# Patient Record
Sex: Male | Born: 1960
Health system: Southern US, Community
[De-identification: ages and names within clinical notes are randomized; demographics above are authoritative.]

## PROBLEM LIST (undated history)

## (undated) DIAGNOSIS — J45909 Unspecified asthma, uncomplicated: Secondary | ICD-10-CM

## (undated) DIAGNOSIS — G312 Degeneration of nervous system due to alcohol: Secondary | ICD-10-CM

## (undated) DIAGNOSIS — R7989 Other specified abnormal findings of blood chemistry: Secondary | ICD-10-CM

## (undated) DIAGNOSIS — F1020 Alcohol dependence, uncomplicated: Secondary | ICD-10-CM

## (undated) DIAGNOSIS — D62 Acute posthemorrhagic anemia: Secondary | ICD-10-CM

## (undated) DIAGNOSIS — R768 Other specified abnormal immunological findings in serum: Secondary | ICD-10-CM

## (undated) DIAGNOSIS — F1010 Alcohol abuse, uncomplicated: Secondary | ICD-10-CM

## (undated) DIAGNOSIS — K2990 Gastroduodenitis, unspecified, without bleeding: Secondary | ICD-10-CM

## (undated) DIAGNOSIS — I10 Essential (primary) hypertension: Secondary | ICD-10-CM

## (undated) DIAGNOSIS — J189 Pneumonia, unspecified organism: Secondary | ICD-10-CM

## (undated) DIAGNOSIS — F1910 Other psychoactive substance abuse, uncomplicated: Secondary | ICD-10-CM

## (undated) DIAGNOSIS — D649 Anemia, unspecified: Secondary | ICD-10-CM

## (undated) DIAGNOSIS — R945 Abnormal results of liver function studies: Secondary | ICD-10-CM

## (undated) DIAGNOSIS — K259 Gastric ulcer, unspecified as acute or chronic, without hemorrhage or perforation: Secondary | ICD-10-CM

## (undated) DIAGNOSIS — R0602 Shortness of breath: Secondary | ICD-10-CM

## (undated) DIAGNOSIS — K264 Chronic or unspecified duodenal ulcer with hemorrhage: Secondary | ICD-10-CM

## (undated) DIAGNOSIS — K625 Hemorrhage of anus and rectum: Secondary | ICD-10-CM

## (undated) HISTORY — PX: SPLENECTOMY: SUR1306

## (undated) HISTORY — PX: TOTAL HIP ARTHROPLASTY: SHX124

## (undated) HISTORY — PX: COLON SURGERY: SHX602

## (undated) HISTORY — PX: BACK SURGERY: SHX140

## (undated) HISTORY — PX: JOINT REPLACEMENT: SHX530

---

## 2008-10-21 DIAGNOSIS — J189 Pneumonia, unspecified organism: Secondary | ICD-10-CM

## 2008-10-21 HISTORY — DX: Pneumonia, unspecified organism: J18.9

## 2012-10-29 ENCOUNTER — Emergency Department (HOSPITAL_COMMUNITY): Payer: Medicare PPO

## 2012-10-29 ENCOUNTER — Inpatient Hospital Stay (HOSPITAL_COMMUNITY)
Admission: EM | Admit: 2012-10-29 | Discharge: 2012-11-04 | DRG: 377 | Disposition: A | Discharge status: Home / Self Care | Payer: Medicare PPO | Attending: Internal Medicine | Admitting: Internal Medicine

## 2012-10-29 ENCOUNTER — Encounter (HOSPITAL_COMMUNITY): Payer: Self-pay | Admitting: *Deleted

## 2012-10-29 DIAGNOSIS — F1910 Other psychoactive substance abuse, uncomplicated: Secondary | ICD-10-CM | POA: Diagnosis present

## 2012-10-29 DIAGNOSIS — R768 Other specified abnormal immunological findings in serum: Secondary | ICD-10-CM

## 2012-10-29 DIAGNOSIS — D72829 Elevated white blood cell count, unspecified: Secondary | ICD-10-CM

## 2012-10-29 DIAGNOSIS — Z87891 Personal history of nicotine dependence: Secondary | ICD-10-CM

## 2012-10-29 DIAGNOSIS — Z9089 Acquired absence of other organs: Secondary | ICD-10-CM

## 2012-10-29 DIAGNOSIS — E876 Hypokalemia: Secondary | ICD-10-CM | POA: Diagnosis present

## 2012-10-29 DIAGNOSIS — F10939 Alcohol use, unspecified with withdrawal, unspecified: Secondary | ICD-10-CM | POA: Diagnosis not present

## 2012-10-29 DIAGNOSIS — F1027 Alcohol dependence with alcohol-induced persisting dementia: Secondary | ICD-10-CM | POA: Diagnosis present

## 2012-10-29 DIAGNOSIS — I959 Hypotension, unspecified: Secondary | ICD-10-CM | POA: Diagnosis not present

## 2012-10-29 DIAGNOSIS — K625 Hemorrhage of anus and rectum: Secondary | ICD-10-CM

## 2012-10-29 DIAGNOSIS — K7010 Alcoholic hepatitis without ascites: Secondary | ICD-10-CM | POA: Diagnosis present

## 2012-10-29 DIAGNOSIS — Z79899 Other long term (current) drug therapy: Secondary | ICD-10-CM

## 2012-10-29 DIAGNOSIS — F1020 Alcohol dependence, uncomplicated: Secondary | ICD-10-CM | POA: Diagnosis present

## 2012-10-29 DIAGNOSIS — K264 Chronic or unspecified duodenal ulcer with hemorrhage: Principal | ICD-10-CM | POA: Diagnosis present

## 2012-10-29 DIAGNOSIS — K259 Gastric ulcer, unspecified as acute or chronic, without hemorrhage or perforation: Secondary | ICD-10-CM | POA: Diagnosis present

## 2012-10-29 DIAGNOSIS — J189 Pneumonia, unspecified organism: Secondary | ICD-10-CM | POA: Diagnosis present

## 2012-10-29 DIAGNOSIS — E46 Unspecified protein-calorie malnutrition: Secondary | ICD-10-CM | POA: Diagnosis present

## 2012-10-29 DIAGNOSIS — E871 Hypo-osmolality and hyponatremia: Secondary | ICD-10-CM | POA: Diagnosis present

## 2012-10-29 DIAGNOSIS — Z9081 Acquired absence of spleen: Secondary | ICD-10-CM

## 2012-10-29 DIAGNOSIS — R000 Tachycardia, unspecified: Secondary | ICD-10-CM

## 2012-10-29 DIAGNOSIS — D649 Anemia, unspecified: Secondary | ICD-10-CM

## 2012-10-29 DIAGNOSIS — Z9660 Presence of unspecified orthopedic joint implant: Secondary | ICD-10-CM

## 2012-10-29 DIAGNOSIS — F1010 Alcohol abuse, uncomplicated: Secondary | ICD-10-CM | POA: Diagnosis present

## 2012-10-29 DIAGNOSIS — D62 Acute posthemorrhagic anemia: Secondary | ICD-10-CM | POA: Diagnosis present

## 2012-10-29 DIAGNOSIS — K449 Diaphragmatic hernia without obstruction or gangrene: Secondary | ICD-10-CM | POA: Diagnosis present

## 2012-10-29 DIAGNOSIS — Z23 Encounter for immunization: Secondary | ICD-10-CM

## 2012-10-29 DIAGNOSIS — K922 Gastrointestinal hemorrhage, unspecified: Secondary | ICD-10-CM | POA: Diagnosis present

## 2012-10-29 DIAGNOSIS — Z720 Tobacco use: Secondary | ICD-10-CM | POA: Diagnosis present

## 2012-10-29 DIAGNOSIS — F10239 Alcohol dependence with withdrawal, unspecified: Secondary | ICD-10-CM

## 2012-10-29 HISTORY — DX: Acute posthemorrhagic anemia: D62

## 2012-10-29 HISTORY — DX: Pneumonia, unspecified organism: J18.9

## 2012-10-29 HISTORY — DX: Unspecified asthma, uncomplicated: J45.909

## 2012-10-29 HISTORY — DX: Anemia, unspecified: D64.9

## 2012-10-29 HISTORY — DX: Hemorrhage of anus and rectum: K62.5

## 2012-10-29 HISTORY — DX: Alcohol abuse, uncomplicated: F10.10

## 2012-10-29 HISTORY — DX: Degeneration of nervous system due to alcohol: G31.2

## 2012-10-29 HISTORY — DX: Essential (primary) hypertension: I10

## 2012-10-29 HISTORY — DX: Other specified abnormal immunological findings in serum: R76.8

## 2012-10-29 HISTORY — DX: Chronic or unspecified duodenal ulcer with hemorrhage: K26.4

## 2012-10-29 HISTORY — DX: Shortness of breath: R06.02

## 2012-10-29 HISTORY — DX: Other psychoactive substance abuse, uncomplicated: F19.10

## 2012-10-29 HISTORY — DX: Alcohol dependence, uncomplicated: F10.20

## 2012-10-29 HISTORY — DX: Gastric ulcer, unspecified as acute or chronic, without hemorrhage or perforation: K25.9

## 2012-10-29 LAB — CBC WITH DIFFERENTIAL/PLATELET
Basophils Absolute: 0.1 10*3/uL (ref 0.0–0.1)
Basophils Relative: 0 % (ref 0–1)
HCT: 21.5 % — ABNORMAL LOW (ref 39.0–52.0)
Hemoglobin: 7.4 g/dL — ABNORMAL LOW (ref 13.0–17.0)
Lymphocytes Relative: 4 % — ABNORMAL LOW (ref 12–46)
MCHC: 34.4 g/dL (ref 30.0–36.0)
Monocytes Absolute: 2.6 10*3/uL — ABNORMAL HIGH (ref 0.1–1.0)
Monocytes Relative: 6 % (ref 3–12)
Neutro Abs: 40.5 10*3/uL — ABNORMAL HIGH (ref 1.7–7.7)
Neutrophils Relative %: 91 % — ABNORMAL HIGH (ref 43–77)
RDW: 14.3 % (ref 11.5–15.5)
Smear Review: INCREASED
WBC: 44.8 10*3/uL — ABNORMAL HIGH (ref 4.0–10.5)

## 2012-10-29 LAB — COMPREHENSIVE METABOLIC PANEL
ALT: 19 U/L (ref 0–53)
AST: 39 U/L — ABNORMAL HIGH (ref 0–37)
Alkaline Phosphatase: 97 U/L (ref 39–117)
Calcium: 8.8 mg/dL (ref 8.4–10.5)
Glucose, Bld: 127 mg/dL — ABNORMAL HIGH (ref 70–99)
Potassium: 3.7 mEq/L (ref 3.5–5.1)
Sodium: 126 mEq/L — ABNORMAL LOW (ref 135–145)
Total Protein: 7.6 g/dL (ref 6.0–8.3)

## 2012-10-29 LAB — ABO/RH: ABO/RH(D): O POS

## 2012-10-29 LAB — APTT: aPTT: 35 seconds (ref 24–37)

## 2012-10-29 LAB — MRSA PCR SCREENING: MRSA by PCR: NEGATIVE

## 2012-10-29 MED ORDER — LEVOFLOXACIN IN D5W 750 MG/150ML IV SOLN
INTRAVENOUS | Status: AC
  Filled 2012-10-29: qty 150

## 2012-10-29 MED ORDER — SODIUM CHLORIDE 0.9 % IV BOLUS (SEPSIS)
500.0000 mL | Freq: Once | INTRAVENOUS | Status: AC
  Administered 2012-10-29: 500 mL via INTRAVENOUS

## 2012-10-29 MED ORDER — SODIUM CHLORIDE 0.9 % IJ SOLN
3.0000 mL | Freq: Two times a day (BID) | INTRAMUSCULAR | Status: DC
  Administered 2012-10-29 – 2012-11-02 (×6): 3 mL via INTRAVENOUS

## 2012-10-29 MED ORDER — THIAMINE HCL 100 MG/ML IJ SOLN
Freq: Once | INTRAVENOUS | Status: AC
  Administered 2012-10-29: 21:00:00 via INTRAVENOUS
  Filled 2012-10-29: qty 1000

## 2012-10-29 MED ORDER — LEVOFLOXACIN IN D5W 750 MG/150ML IV SOLN
750.0000 mg | Freq: Once | INTRAVENOUS | Status: AC
  Administered 2012-10-29: 750 mg via INTRAVENOUS
  Filled 2012-10-29: qty 150

## 2012-10-29 MED ORDER — ONDANSETRON HCL 4 MG PO TABS: 4.0000 mg | ORAL_TABLET | Freq: Four times a day (QID) | ORAL | Status: DC | PRN

## 2012-10-29 MED ORDER — VANCOMYCIN HCL IN DEXTROSE 1-5 GM/200ML-% IV SOLN
1000.0000 mg | INTRAVENOUS | Status: AC
  Administered 2012-10-29: 1000 mg via INTRAVENOUS

## 2012-10-29 MED ORDER — INFLUENZA VIRUS VACC SPLIT PF IM SUSP
0.5000 mL | INTRAMUSCULAR | Status: AC
  Filled 2012-10-29: qty 0.5

## 2012-10-29 MED ORDER — PANTOPRAZOLE SODIUM 40 MG IV SOLR
40.0000 mg | INTRAVENOUS | Status: DC
  Administered 2012-10-29: 40 mg via INTRAVENOUS
  Filled 2012-10-29: qty 40

## 2012-10-29 MED ORDER — VANCOMYCIN HCL IN DEXTROSE 1-5 GM/200ML-% IV SOLN
INTRAVENOUS | Status: AC
  Filled 2012-10-29: qty 200

## 2012-10-29 MED ORDER — ONDANSETRON HCL 4 MG/2ML IJ SOLN: 4.0000 mg | Freq: Four times a day (QID) | INTRAMUSCULAR | Status: DC | PRN

## 2012-10-29 MED ORDER — LEVOFLOXACIN IN D5W 750 MG/150ML IV SOLN
750.0000 mg | INTRAVENOUS | Status: DC
  Administered 2012-10-31 – 2012-11-04 (×5): 750 mg via INTRAVENOUS
  Filled 2012-10-29 (×7): qty 150

## 2012-10-29 MED ORDER — IOHEXOL 300 MG/ML  SOLN
100.0000 mL | Freq: Once | INTRAMUSCULAR | Status: AC | PRN
  Administered 2012-10-29: 100 mL via INTRAVENOUS

## 2012-10-29 MED ORDER — VANCOMYCIN HCL IN DEXTROSE 1-5 GM/200ML-% IV SOLN
1000.0000 mg | Freq: Two times a day (BID) | INTRAVENOUS | Status: DC
  Administered 2012-10-30 – 2012-10-31 (×3): 1000 mg via INTRAVENOUS
  Filled 2012-10-29 (×8): qty 200

## 2012-10-29 MED ORDER — SODIUM CHLORIDE 0.9 % IV SOLN
INTRAVENOUS | Status: DC
  Administered 2012-10-29: 16:00:00 via INTRAVENOUS
  Administered 2012-10-30: 1000 mL via INTRAVENOUS
  Administered 2012-10-30 – 2012-10-31 (×3): via INTRAVENOUS

## 2012-10-29 MED ORDER — SODIUM CHLORIDE 0.9 % IV BOLUS (SEPSIS): 500.0000 mL | Freq: Once | INTRAVENOUS | Status: DC

## 2012-10-29 MED ORDER — LEVOFLOXACIN IN D5W 750 MG/150ML IV SOLN: 750.0000 mg | INTRAVENOUS | Status: DC

## 2012-10-29 NOTE — ED Provider Notes (Signed)
History     CSN: [id number]  Arrival date & time 10/29/12  1029   First MD Initiated Contact with Patient 10/29/12 1217      Chief Complaint  Patient presents with  . Rectal Bleeding     HPI Pt was seen at 1255.   Per pt, c/o gradual onset and persistence of multiple intermittent episodes of rectal bleeding and black stools for the past 1 week. Pt states he had several episodes of NB/NB N/V 2 days ago, since resolved.  Pt also c/o gradual onset and persistence of constant cough for the past 1 week.  States his "ribs hurt from coughing so much."  Denies fevers, no back pain, no CP/palpitations, no diarrhea, no black or blood in emesis, no rectal pain.      Past Medical History  Diagnosis Date  . Rectal bleeding     Past Surgical History  Procedure Date  . Joint replacement   . Total hip arthroplasty   . Back surgery   . Colon surgery       History  Substance Use Topics  . Smoking status: Current Every Day Smoker  . Smokeless tobacco: Not on file  . Alcohol Use: Yes      Review of Systems ROS: Statement: All systems negative except as marked or noted in the HPI; Constitutional: Negative for fever and chills. ; ; Eyes: Negative for eye pain, redness and discharge. ; ; ENMT: Negative for ear pain, hoarseness, nasal congestion, sinus pressure and sore throat. ; ; Cardiovascular: Negative for chest pain, palpitations, diaphoresis, dyspnea and peripheral edema. ; ; Respiratory: +cough. Negative for wheezing and stridor. ; ; Gastrointestinal: +N/V, abd pain, rectal bleeding, black stools. Negative for diarrhea, blood in stool, hematemesis, jaundice. . ; ; Genitourinary: Negative for dysuria, flank pain and hematuria. ; ; Musculoskeletal: Negative for back pain and neck pain. Negative for swelling and trauma.; ; Skin: Negative for pruritus, rash, abrasions, blisters, bruising and skin lesion.; ; Neuro: Negative for headache, lightheadedness and neck stiffness. Negative for weakness,  altered level of consciousness , altered mental status, extremity weakness, paresthesias, involuntary movement, seizure and syncope.       Allergies  Penicillins  Home Medications  No current outpatient prescriptions on file.  BP 129/83  Pulse 130  Temp 98.1 F (36.7 C) (Oral)  Resp 20  Ht 5\' 6"  (1.676 m)  Wt 143 lb (64.864 kg)  BMI 23.08 kg/m2  SpO2 92%  Physical Exam 1300: Physical examination:  Nursing notes reviewed; Vital signs and O2 SAT reviewed;  Constitutional: Well developed, Well nourished, In no acute distress; Head:  Normocephalic, atraumatic; Eyes: EOMI, PERRL, No scleral icterus; ENMT: Mouth and pharynx normal, Mucous membranes dry; Neck: Supple, Full range of motion, No lymphadenopathy; Cardiovascular: Tachycardic rate and rhythm, No gallop; Respiratory: Breath sounds clear & equal bilaterally, No rales, rhonchi, wheezes.  Speaking full sentences with ease, Normal respiratory effort/excursion; Chest: Nontender, Movement normal; Abdomen: Soft, +diffuse tenderness to palp. Nondistended, Normal bowel sounds. Rectal exam performed w/permission of pt and ED RN chaparone present.  Anal tone normal.  Non-tender, soft maroon stool in rectal vault, heme positive.  No fissures, no thrombosed/bleeding external hemorrhoids, no palp masses.;; Extremities: Pulses normal, No tenderness, No edema, No calf edema or asymmetry.; Neuro: AA&Ox3, Major CN grossly intact.  Speech clear. No gross focal motor or sensory deficits in extremities.; Skin: Color pale, Warm, Dry.   ED Course  Procedures   1215:  WBC count significantly elevated.  Will  obtain CT A/P to r/o acute intra-abd process.  CXR without obvious pneumonia.  Hgb 7.4: Type/screen and transfusion orders completed.  Will give IVF bolus for tachycardia until PRBC transfusion ready.  ED RN to start 2nd IV site.  SBP stable, remains afebrile.  Pt again asked by myself and ED RN if he drinks etoh daily; pt continues to deny this saying he  "just drinks a little sometimes."  Question if etoh withdrawal may be contributing pt pt's tachycardia.     1450:  CT A/P without acute abd process, but does have widespread multilobar pneumonia.  Will start IV abx and admit to ICU.  Pt awake/alert, resps without distress, Sats 94-95% R/A.  Remains tachycardic despite IVF boluses.  Will start PRBC transfusion.  Dx and testing d/w pt and family.  Questions answered.  Verb understanding, agreeable to admit.    1535:  T/C to [facility]. Karilyn Cota, case discussed, including:  HPI, pertinent PM/SHx, VS/PE, dx testing, ED course and treatment:  Agreeable to admit, requests he will come to ED for eval.    MDM  MDM Reviewed: nursing note and vitals Interpretation: labs and CT scan Total time providing critical care: 30-74 minutes. This excludes time spent performing separately reportable procedures and services. Consults: admitting MD   CRITICAL CARE Performed by: Laray Anger Total critical care time: 45 Critical care time was exclusive of separately billable procedures and treating other patients. Critical care was necessary to treat or prevent imminent or life-threatening deterioration. Critical care was time spent personally by me on the following activities: development of treatment plan with patient and/or surrogate as well as nursing, discussions with consultants, evaluation of patient's response to treatment, examination of patient, obtaining history from patient or surrogate, ordering and performing treatments and interventions, ordering and review of laboratory studies, ordering and review of radiographic studies, pulse oximetry and re-evaluation of patient's condition.   Results for orders placed during the hospital encounter of 10/29/12  CBC WITH DIFFERENTIAL      Component Value Range   WBC 44.8 (*) 4.0 - 10.5 K/uL   RBC 2.33 (*) 4.22 - 5.81 MIL/uL   Hemoglobin 7.4 (*) 13.0 - 17.0 g/dL   HCT 16.1 (*) 09.6 - 04.5 %   MCV 92.3  78.0  - 100.0 fL   MCH 31.8  26.0 - 34.0 pg   MCHC 34.4  30.0 - 36.0 g/dL   RDW 40.9  81.1 - 91.4 %   Platelets 559 (*) 150 - 400 K/uL   Neutrophils Relative 91 (*) 43 - 77 %   Neutro Abs 40.5 (*) 1.7 - 7.7 K/uL   Lymphocytes Relative 4 (*) 12 - 46 %   Lymphs Abs 1.6  0.7 - 4.0 K/uL   Monocytes Relative 6  3 - 12 %   Monocytes Absolute 2.6 (*) 0.1 - 1.0 K/uL   Eosinophils Relative 0  0 - 5 %   Eosinophils Absolute 0.0  0.0 - 0.7 K/uL   Basophils Relative 0  0 - 1 %   Basophils Absolute 0.1  0.0 - 0.1 K/uL   WBC Morphology INCREASED BANDS (>20% BANDS)     Smear Review PLATELETS APPEAR INCREASED    COMPREHENSIVE METABOLIC PANEL      Component Value Range   Sodium 126 (*) 135 - 145 mEq/L   Potassium 3.7  3.5 - 5.1 mEq/L   Chloride 89 (*) 96 - 112 mEq/L   CO2 30  19 - 32 mEq/L   Glucose,  Bld 127 (*) 70 - 99 mg/dL   BUN 23  6 - 23 mg/dL   Creatinine, Ser 4.09  0.50 - 1.35 mg/dL   Calcium 8.8  8.4 - 81.1 mg/dL   Total Protein 7.6  6.0 - 8.3 g/dL   Albumin 2.4 (*) 3.5 - 5.2 g/dL   AST 39 (*) 0 - 37 U/L   ALT 19  0 - 53 U/L   Alkaline Phosphatase 97  39 - 117 U/L   Total Bilirubin 0.4  0.3 - 1.2 mg/dL   GFR calc non Af Amer >90  >90 mL/min   GFR calc Af Amer >90  >90 mL/min  TYPE AND SCREEN      Component Value Range   ABO/RH(D) O POS     Antibody Screen NEG     Sample Expiration 11/01/2012     Unit Number [id number]     Blood Component Type RBC LR PHER2     Unit division 00     Status of Unit ISSUED     Transfusion Status OK TO TRANSFUSE     Crossmatch Result Compatible     Unit Number [id number]     Blood Component Type RBC LR PHER1     Unit division 00     Status of Unit ALLOCATED     Transfusion Status OK TO TRANSFUSE     Crossmatch Result Compatible    ETHANOL      Component Value Range   Alcohol, Ethyl (B) <11  0 - 11 mg/dL  APTT      Component Value Range   aPTT 35  24 - 37 seconds  PROTIME-INR      Component Value Range   Prothrombin Time 14.3  11.6 - 15.2  seconds   INR 1.13  0.00 - 1.49  PREPARE RBC (CROSSMATCH)      Component Value Range   Order Confirmation ORDER PROCESSED BY BLOOD BANK    ABO/RH      Component Value Range   ABO/RH(D) O POS     Dg Chest 2 View 10/29/2012  *RADIOLOGY REPORT*  Clinical Data: Cough, chest pain, shortness of breath  CHEST - 2 VIEW  Comparison: None.  Findings: Chronic interstitial markings/hyperinflation.  No focal consolidation. No pleural effusion or pneumothorax.  Cardiomediastinal silhouette is within normal limits.  Multiple healed left rib fractures.  Moderate compression deformity at T8, age indeterminate, likely chronic.  IMPRESSION: No evidence of acute cardiopulmonary disease.   Original Report Authenticated By: Charline Bills, M.D.    Ct Abdomen Pelvis W Contrast 10/29/2012  *RADIOLOGY REPORT*  Clinical Data: Abdominal pain.  Rectal bleeding.  Leukocytosis.  CT ABDOMEN AND PELVIS WITH CONTRAST  Technique:  Multidetector CT imaging of the abdomen and pelvis was performed following the standard protocol during bolus administration of intravenous contrast.  Contrast: OMNIPAQUE IOHEXOL 300 MG/ML  SOLN  Comparison: No priors.  Findings:  Lung Bases: Extensive bronchial wall thickening with patchy peribronchovascular micronodularity and ground-glass attenuation throughout the visualized lung bases.  Abdomen/Pelvis:  The appearance of the liver, gallbladder, pancreas, bilateral adrenal glands and bilateral kidneys is unremarkable.  The patient is status post splenectomy.  Enhancing 3.3 x 2.5 cm lesion adjacent to the splenectomy bed is most characteristic for a splenule.  Assessment of the anatomic pelvis is slightly limited by beam hardening artifact from the patient's right-sided total hip arthroplasty.  Despite this limitation, there is no significant volume of ascites and no pneumoperitoneum.  No pathologic distension of  bowel.  No definite pathologic lymphadenopathy identified within the abdomen or pelvis.   Prostate and urinary bladder are unremarkable in appearance.  Normal appendix.  Musculoskeletal: Status post right total hip arthroplasty and PLIF at L5-S1. There are no aggressive appearing lytic or blastic lesions noted in the visualized portions of the skeleton.  Healing fracture of a lower left rib (likely the posterior aspect of the left tenth rib).  IMPRESSION: 1.  No definite acute findings in the abdomen or pelvis to account for the patient's symptoms. 2.  However, there is extensive bronchial wall thickening with peribronchovascular ground-glass attenuation and micronodularity throughout the visualized lung bases bilaterally, suggesting widespread multilobar bronchopneumonia. 3.  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 4.  Healing fracture of the posterolateral aspect of the left tenth rib.   Original Report Authenticated By: Trudie Reed, M.D.            Laray Anger, DO 10/30/12 2041

## 2012-10-29 NOTE — ED Notes (Addendum)
Rectal bleeding since Monday.vomited 2 days ago, none today. Sob.Pain in back and rt lat ribs.  Cough congestion

## 2012-10-29 NOTE — Progress Notes (Signed)
ANTIBIOTIC CONSULT NOTE - INITIAL  Pharmacy Consult for Vancomycin Indication: rule out pneumonia  Allergies  Allergen Reactions  . Penicillins Rash    Patient Measurements: Height: 5\' 6"  (167.6 cm) Weight: 143 lb (64.864 kg) IBW/kg (Calculated) : 63.8   Vital Signs: Temp: 98.7 F (37.1 C) (01/09 1513) Temp src: Oral (01/09 1513) BP: 126/82 mmHg (01/09 1513) Pulse Rate: 127  (01/09 1400) Intake/Output from previous day:   Intake/Output from this shift: Total I/O In: -  Out: 300 [Urine:300]  Labs:  [facility] 10/29/12 1158  WBC 44.8*  HGB 7.4*  PLT 559*  LABCREA --  CREATININE 0.76   Estimated Creatinine Clearance: 97.5 ml/min (by C-G formula based on Cr of 0.76). No results found for this basename: VANCOTROUGH:2,VANCOPEAK:2,VANCORANDOM:2,GENTTROUGH:2,GENTPEAK:2,GENTRANDOM:2,TOBRATROUGH:2,TOBRAPEAK:2,TOBRARND:2,AMIKACINPEAK:2,AMIKACINTROU:2,AMIKACIN:2, in the last 72 hours   Microbiology: No results found for this or any previous visit (from the past 720 hour(s)).  Medical History: Past Medical History  Diagnosis Date  . Rectal bleeding     Medications:  Scheduled:   Assessment: 52yo male admitted with suspected pna.  Good renal fxn.  Estimated Creatinine Clearance: 97.5 ml/min (by C-G formula based on Cr of 0.76).  Vancomycin 1gm and Levaquin 750mg  IV to be given in ED.  Goal of Therapy:  Vancomycin trough level 15-20 mcg/ml  Plan: *  Vancomycin 1gm iv q12hrs *  Check trough at steady state *  Monitor labs, renal fxn, and cultures per protocol *  Duration of therapy per MD (anticipate 8 days)  Valrie Hart A 10/29/2012,3:22 PM

## 2012-10-29 NOTE — Plan of Care (Signed)
Problem: Consults Goal: GI Bleeding Patient Education See Patient Education Module for education specifics. Outcome: Progressing Exit care gi bleed handout given Goal: Skin Care Protocol Initiated - if Braden Score 18 or less If consults are not indicated, leave blank or document N/A Outcome: Progressing Skin clear of any breakdown Goal: Nutrition Consult-if indicated Outcome: Progressing Patient has lost weight over the last few years without trying.  Not sure why  Problem: Phase I Progression Outcomes Goal: Pain controlled with appropriate interventions Outcome: Progressing Abdominal crushing pain, per patient rated 8 Goal: OOB as tolerated unless otherwise ordered Outcome: Progressing Stands at bedside with assist Goal: Initial discharge plan identified Outcome: Progressing Lives at home with mom. Goal: Hemodynamically stable Outcome: Progressing Tachycardia, and oxygen is 92% on room air, 2 liters nasal cannula applied 98%

## 2012-10-29 NOTE — Plan of Care (Signed)
Problem: Consults Goal: Pneumonia Patient Education See Patient Educatio Module for education specifics. Outcome: Progressing Pneumonia handout given from exit care and discussed  Problem: Phase I Progression Outcomes Goal: Dyspnea controlled at rest Outcome: Progressing Dyspnea noted during exertion only Goal: OOB as tolerated unless otherwise ordered Outcome: Progressing OOB with assistance only Goal: Code status addressed with pt/family Outcome: Completed/Met Date Met:  10/29/12 Patient is a full code Goal: Initial discharge plan identified Outcome: Progressing Lives at home with his mom Goal: Hemodynamically stable Outcome: Progressing Tachycardia and decreased oxygen saturation

## 2012-10-29 NOTE — H&P (Signed)
[facility] History and Physical  Raymond Fox ZOX:[id number] DOB: Feb 06, 1961 DOA: 10/29/2012   Chief Complaint: Black stools, productive cough, dyspnea.  HPI: Raymond Fox is a 52 y.o. male who presents with a proximately one week history of black stools, on at least a couple of occasions a red rectal bleeding also. There is no associated hematemesis or abdominal pain. He also describes a productive cough associated with dyspnea for the last week or so also. He has been having subjective fevers. He says he drinks 1-2 cans of 24 ounce beers daily. He apparently quit smoking. When he was seen in the emergency room, he was found to be significantly anemic and also to have a bronchopneumonia on CT scanning. He is now being admitted for these issues.   Review of Systems:  Apart from history of present illness, other systems negative.  Past Medical History  Diagnosis Date  . Rectal bleeding    Past Surgical History  Procedure Date  . Joint replacement   . Total hip arthroplasty   . Back surgery   . Colon surgery    Social History:  Patient lives with his mother. He is not married. He did have a smoking history, currently apparently a nonsmoker. He does drink alcohol as outlined above. I suspect he drinks more alcohol than reported.  Allergies  Allergen Reactions  . Penicillins Rash    History reviewed. No pertinent family history. noncontributory.   Prior to Admission medications   Not on File   Physical Exam: Filed Vitals:   10/29/12 1400 10/29/12 1500 10/29/12 1513 10/29/12 1558  BP: 126/81 141/81 126/82 142/83  Pulse: 127     Temp:   98.7 F (37.1 C) 98.5 F (36.9 C)  TempSrc:   Oral Oral  Resp:   20 20  Height:      Weight:      SpO2: 92%  94% 93%     General:  He looks pale. He is otherwise not toxic systemically.  Eyes: No jaundice. Somewhat pale conjunctiva.  ENT: No abnormalities.  Neck: No lymphadenopathy.  Cardiovascular: Heart sounds are  present and he has a sinus tachycardia. There are no murmurs.  Respiratory: Lung fields show bilateral rhonchi with occasional crackles. There is no bronchial breathing.  Abdomen: Soft, nontender apart from in the epigastric area. There does not appear to be any obvious hepatosplenomegaly.  Skin: Several tattoos on his back. No rash.  Musculoskeletal: No joint abnormalities.  Psychiatric: Appropriate affect.  Neurologic: Alert and orientated without any focal neurological signs.  Labs on Admission:  Basic Metabolic Panel:  Lab 10/29/12 8119  NA 126*  K 3.7  CL 89*  CO2 30  GLUCOSE 127*  BUN 23  CREATININE 0.76  CALCIUM 8.8  MG --  PHOS --   Liver Function Tests:  Lab 10/29/12 1158  AST 39*  ALT 19  ALKPHOS 97  BILITOT 0.4  PROT 7.6  ALBUMIN 2.4*     CBC:  Lab 10/29/12 1158  WBC 44.8*  NEUTROABS 40.5*  HGB 7.4*  HCT 21.5*  MCV 92.3  PLT 559*      Radiological Exams on Admission: Dg Chest 2 View  10/29/2012  *RADIOLOGY REPORT*  Clinical Data: Cough, chest pain, shortness of breath  CHEST - 2 VIEW  Comparison: None.  Findings: Chronic interstitial markings/hyperinflation.  No focal consolidation. No pleural effusion or pneumothorax.  Cardiomediastinal silhouette is within normal limits.  Multiple healed left rib fractures.  Moderate compression deformity at  T8, age indeterminate, likely chronic.  IMPRESSION: No evidence of acute cardiopulmonary disease.   Original Report Authenticated By: Charline Bills, M.D.    Ct Abdomen Pelvis W Contrast  10/29/2012  *RADIOLOGY REPORT*  Clinical Data: Abdominal pain.  Rectal bleeding.  Leukocytosis.  CT ABDOMEN AND PELVIS WITH CONTRAST  Technique:  Multidetector CT imaging of the abdomen and pelvis was performed following the standard protocol during bolus administration of intravenous contrast.  Contrast: OMNIPAQUE IOHEXOL 300 MG/ML  SOLN  Comparison: No priors.  Findings:  Lung Bases: Extensive bronchial wall  thickening with patchy peribronchovascular micronodularity and ground-glass attenuation throughout the visualized lung bases.  Abdomen/Pelvis:  The appearance of the liver, gallbladder, pancreas, bilateral adrenal glands and bilateral kidneys is unremarkable.  The patient is status post splenectomy.  Enhancing 3.3 x 2.5 cm lesion adjacent to the splenectomy bed is most characteristic for a splenule.  Assessment of the anatomic pelvis is slightly limited by beam hardening artifact from the patient's right-sided total hip arthroplasty.  Despite this limitation, there is no significant volume of ascites and no pneumoperitoneum.  No pathologic distension of bowel.  No definite pathologic lymphadenopathy identified within the abdomen or pelvis.  Prostate and urinary bladder are unremarkable in appearance.  Normal appendix.  Musculoskeletal: Status post right total hip arthroplasty and PLIF at L5-S1. There are no aggressive appearing lytic or blastic lesions noted in the visualized portions of the skeleton.  Healing fracture of a lower left rib (likely the posterior aspect of the left tenth rib).  IMPRESSION: 1.  No definite acute findings in the abdomen or pelvis to account for the patient's symptoms. 2.  However, there is extensive bronchial wall thickening with peribronchovascular ground-glass attenuation and micronodularity throughout the visualized lung bases bilaterally, suggesting widespread multilobar bronchopneumonia. 3.  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 4.  Healing fracture of the posterolateral aspect of the left tenth rib.   Original Report Authenticated By: Trudie Reed, M.D.      Assessment/Plan   1. GI bleed, probably upper. 2. Community-acquired pneumonia, pattern of bronchopneumonia. 3. Alcohol excess.  Plan: 1. Admit to step down unit. 2. Transfuse with blood. 3. Gastroenterology consultation. 4. Intravenous antibiotics were  bronchopneumonia. Further recommendations will depend on patient's hospital progress.  Code Status: Full code.   Family Communication: Discussed with patient at the bedside.   Disposition Plan: Home when medically stable.   Time spent: 45 minutes.  Wilson Singer [facility] Pager [phone].  If 7PM-7AM, please contact night-coverage [email or web address] Password [email or web address] 10/29/2012, 4:07 PM

## 2012-10-30 ENCOUNTER — Encounter (HOSPITAL_COMMUNITY): Payer: Self-pay | Admitting: Urgent Care

## 2012-10-30 ENCOUNTER — Encounter (HOSPITAL_COMMUNITY)
Admission: EM | Disposition: A | Discharge status: Home / Self Care | Payer: Self-pay | Source: Home / Self Care | Attending: Internal Medicine

## 2012-10-30 ENCOUNTER — Encounter (HOSPITAL_COMMUNITY): Payer: Self-pay | Admitting: Anesthesiology

## 2012-10-30 ENCOUNTER — Inpatient Hospital Stay (HOSPITAL_COMMUNITY): Payer: Medicare PPO | Admitting: Anesthesiology

## 2012-10-30 ENCOUNTER — Encounter (HOSPITAL_COMMUNITY): Payer: Self-pay

## 2012-10-30 DIAGNOSIS — K279 Peptic ulcer, site unspecified, unspecified as acute or chronic, without hemorrhage or perforation: Secondary | ICD-10-CM

## 2012-10-30 DIAGNOSIS — K921 Melena: Secondary | ICD-10-CM

## 2012-10-30 DIAGNOSIS — F1910 Other psychoactive substance abuse, uncomplicated: Secondary | ICD-10-CM

## 2012-10-30 DIAGNOSIS — D649 Anemia, unspecified: Secondary | ICD-10-CM

## 2012-10-30 DIAGNOSIS — K922 Gastrointestinal hemorrhage, unspecified: Secondary | ICD-10-CM

## 2012-10-30 DIAGNOSIS — E871 Hypo-osmolality and hyponatremia: Secondary | ICD-10-CM

## 2012-10-30 HISTORY — PX: ESOPHAGOGASTRODUODENOSCOPY (EGD) WITH PROPOFOL: SHX5813

## 2012-10-30 HISTORY — PX: BIOPSY: SHX5522

## 2012-10-30 LAB — COMPREHENSIVE METABOLIC PANEL
ALT: 19 U/L (ref 0–53)
AST: 45 U/L — ABNORMAL HIGH (ref 0–37)
Albumin: 2.1 g/dL — ABNORMAL LOW (ref 3.5–5.2)
Alkaline Phosphatase: 79 U/L (ref 39–117)
CO2: 27 mEq/L (ref 19–32)
Chloride: 95 mEq/L — ABNORMAL LOW (ref 96–112)
Creatinine, Ser: 0.7 mg/dL (ref 0.50–1.35)
GFR calc non Af Amer: >90 mL/min (ref >90)
Potassium: 3.1 mEq/L — ABNORMAL LOW (ref 3.5–5.1)
Total Bilirubin: 0.7 mg/dL (ref 0.3–1.2)

## 2012-10-30 LAB — HIV ANTIBODY (ROUTINE TESTING W REFLEX): HIV: NONREACTIVE

## 2012-10-30 LAB — CBC
MCH: 31.1 pg (ref 26.0–34.0)
MCHC: 33.9 g/dL (ref 30.0–36.0)
MCV: 91.9 fL (ref 78.0–100.0)
Platelets: 473 10*3/uL — ABNORMAL HIGH (ref 150–400)
RBC: 2.73 MIL/uL — ABNORMAL LOW (ref 4.22–5.81)
RDW: 14.7 % (ref 11.5–15.5)

## 2012-10-30 SURGERY — ESOPHAGOGASTRODUODENOSCOPY (EGD) WITH PROPOFOL
Anesthesia: Monitor Anesthesia Care

## 2012-10-30 SURGERY — ESOPHAGOGASTRODUODENOSCOPY (EGD) WITH PROPOFOL
Anesthesia: Monitor Anesthesia Care | Site: Esophagus

## 2012-10-30 MED ORDER — MIDAZOLAM HCL 2 MG/2ML IJ SOLN
INTRAMUSCULAR | Status: AC
  Filled 2012-10-30: qty 2

## 2012-10-30 MED ORDER — VITAMIN B-1 100 MG PO TABS
100.0000 mg | ORAL_TABLET | Freq: Every day | ORAL | Status: DC
  Administered 2012-10-31 – 2012-11-04 (×5): 100 mg via ORAL
  Filled 2012-10-30 (×5): qty 1

## 2012-10-30 MED ORDER — ONDANSETRON HCL 4 MG/2ML IJ SOLN: 4.0000 mg | Freq: Once | INTRAMUSCULAR | Status: DC | PRN

## 2012-10-30 MED ORDER — LORAZEPAM 1 MG PO TABS
1.0000 mg | ORAL_TABLET | Freq: Four times a day (QID) | ORAL | Status: AC | PRN
  Filled 2012-10-30: qty 2

## 2012-10-30 MED ORDER — LIDOCAINE HCL (CARDIAC) 10 MG/ML IV SOLN
INTRAVENOUS | Status: DC | PRN
  Administered 2012-10-30: 50 mg via INTRAVENOUS

## 2012-10-30 MED ORDER — LACTATED RINGERS IV SOLN
INTRAVENOUS | Status: DC | PRN
  Administered 2012-10-30: 12:00:00 via INTRAVENOUS

## 2012-10-30 MED ORDER — FENTANYL CITRATE 0.05 MG/ML IJ SOLN: 25.0000 ug | INTRAMUSCULAR | Status: DC | PRN

## 2012-10-30 MED ORDER — SODIUM CHLORIDE 0.9 % IV SOLN
8.0000 mg/h | INTRAVENOUS | Status: DC
  Administered 2012-10-30 – 2012-11-01 (×7): 8 mg/h via INTRAVENOUS
  Filled 2012-10-30 (×8): qty 80

## 2012-10-30 MED ORDER — LIDOCAINE HCL (PF) 1 % IJ SOLN
INTRAMUSCULAR | Status: AC
  Filled 2012-10-30: qty 5

## 2012-10-30 MED ORDER — FENTANYL CITRATE 0.05 MG/ML IJ SOLN
INTRAMUSCULAR | Status: AC
  Filled 2012-10-30: qty 2

## 2012-10-30 MED ORDER — PANTOPRAZOLE SODIUM 40 MG IV SOLR
40.0000 mg | Freq: Two times a day (BID) | INTRAVENOUS | Status: DC
  Administered 2012-10-30: 40 mg via INTRAVENOUS
  Filled 2012-10-30: qty 40

## 2012-10-30 MED ORDER — PROPOFOL INFUSION 10 MG/ML OPTIME
INTRAVENOUS | Status: DC | PRN
  Administered 2012-10-30: 75 ug/kg/min via INTRAVENOUS
  Administered 2012-10-30: 125 ug/kg/min via INTRAVENOUS

## 2012-10-30 MED ORDER — LABETALOL HCL 5 MG/ML IV SOLN
INTRAVENOUS | Status: AC
  Filled 2012-10-30: qty 4

## 2012-10-30 MED ORDER — PROPOFOL 10 MG/ML IV EMUL
INTRAVENOUS | Status: AC
  Filled 2012-10-30: qty 20

## 2012-10-30 MED ORDER — ADULT MULTIVITAMIN W/MINERALS CH
1.0000 | ORAL_TABLET | Freq: Every day | ORAL | Status: DC
  Administered 2012-10-31 – 2012-11-04 (×5): 1 via ORAL
  Filled 2012-10-30 (×5): qty 1

## 2012-10-30 MED ORDER — POTASSIUM CHLORIDE 10 MEQ/100ML IV SOLN
INTRAVENOUS | Status: AC
  Administered 2012-10-30: 10 meq
  Filled 2012-10-30: qty 200

## 2012-10-30 MED ORDER — MIDAZOLAM HCL 2 MG/2ML IJ SOLN
1.0000 mg | INTRAMUSCULAR | Status: DC | PRN
  Administered 2012-10-30: 2 mg via INTRAVENOUS

## 2012-10-30 MED ORDER — POTASSIUM CHLORIDE 10 MEQ/100ML IV SOLN
10.0000 meq | INTRAVENOUS | Status: AC
  Administered 2012-10-30: 10 meq via INTRAVENOUS

## 2012-10-30 MED ORDER — SODIUM CHLORIDE 0.9 % IJ SOLN
10.0000 mL | INTRAMUSCULAR | Status: DC | PRN
  Administered 2012-10-30 (×2): 10 mL via INTRAVENOUS

## 2012-10-30 MED ORDER — EPINEPHRINE HCL 0.1 MG/ML IJ SOLN
INTRAMUSCULAR | Status: DC | PRN
  Administered 2012-10-30: 0.3 mg

## 2012-10-30 MED ORDER — LORAZEPAM 0.5 MG PO TABS
0.0000 mg | ORAL_TABLET | Freq: Four times a day (QID) | ORAL | Status: AC
  Administered 2012-10-30 – 2012-10-31 (×4): 2 mg via ORAL
  Administered 2012-11-01: 3 mg via ORAL
  Administered 2012-11-01: 2.5 mg via ORAL
  Administered 2012-11-01: 2 mg via ORAL
  Administered 2012-11-01: 3 mg via ORAL
  Filled 2012-10-30: qty 4
  Filled 2012-10-30: qty 6
  Filled 2012-10-30: qty 4
  Filled 2012-10-30: qty 5
  Filled 2012-10-30: qty 4
  Filled 2012-10-30: qty 6
  Filled 2012-10-30 (×2): qty 4

## 2012-10-30 MED ORDER — LACTATED RINGERS IV SOLN
INTRAVENOUS | Status: DC
  Administered 2012-10-30: 12:00:00 via INTRAVENOUS

## 2012-10-30 MED ORDER — SODIUM CHLORIDE 0.9 % IV SOLN
80.0000 mg | Freq: Once | INTRAVENOUS | Status: AC
  Administered 2012-10-30: 80 mg via INTRAVENOUS
  Filled 2012-10-30: qty 80

## 2012-10-30 MED ORDER — ARTIFICIAL TEARS OP OINT
TOPICAL_OINTMENT | OPHTHALMIC | Status: AC
  Filled 2012-10-30: qty 3.5

## 2012-10-30 MED ORDER — THIAMINE HCL 100 MG/ML IJ SOLN: 100.0000 mg | Freq: Every day | INTRAMUSCULAR | Status: DC

## 2012-10-30 MED ORDER — FOLIC ACID 1 MG PO TABS
1.0000 mg | ORAL_TABLET | Freq: Every day | ORAL | Status: DC
  Administered 2012-10-31 – 2012-11-04 (×5): 1 mg via ORAL
  Filled 2012-10-30 (×5): qty 1

## 2012-10-30 MED ORDER — STERILE WATER FOR IRRIGATION IR SOLN
Status: DC | PRN
  Administered 2012-10-30: 12:00:00

## 2012-10-30 MED ORDER — LORAZEPAM 2 MG/ML IJ SOLN
1.0000 mg | Freq: Four times a day (QID) | INTRAMUSCULAR | Status: AC | PRN
  Administered 2012-10-30 – 2012-11-02 (×6): 1 mg via INTRAVENOUS
  Filled 2012-10-30 (×6): qty 1

## 2012-10-30 MED ORDER — LABETALOL HCL 5 MG/ML IV SOLN
INTRAVENOUS | Status: DC | PRN
  Administered 2012-10-30: 5 mg via INTRAVENOUS

## 2012-10-30 MED ORDER — HYDRALAZINE HCL 20 MG/ML IJ SOLN
5.0000 mg | INTRAMUSCULAR | Status: DC | PRN
  Administered 2012-10-30: 5 mg via INTRAVENOUS
  Filled 2012-10-30: qty 1

## 2012-10-30 MED ORDER — EPINEPHRINE HCL 0.1 MG/ML IJ SOLN
INTRAMUSCULAR | Status: AC
  Filled 2012-10-30: qty 10

## 2012-10-30 MED ORDER — FENTANYL CITRATE 0.05 MG/ML IJ SOLN
INTRAMUSCULAR | Status: DC | PRN
  Administered 2012-10-30 (×2): 50 ug via INTRAVENOUS

## 2012-10-30 MED ORDER — LORAZEPAM 0.5 MG PO TABS
0.0000 mg | ORAL_TABLET | Freq: Two times a day (BID) | ORAL | Status: AC
  Administered 2012-11-01: 3 mg via ORAL
  Administered 2012-11-02: 1 mg via ORAL
  Administered 2012-11-03 (×2): 2 mg via ORAL
  Filled 2012-10-30: qty 4
  Filled 2012-10-30: qty 6
  Filled 2012-10-30: qty 4

## 2012-10-30 MED ORDER — NICOTINE 21 MG/24HR TD PT24
21.0000 mg | MEDICATED_PATCH | Freq: Every day | TRANSDERMAL | Status: DC
  Administered 2012-10-30 – 2012-10-31 (×2): 21 mg via TRANSDERMAL
  Filled 2012-10-30 (×2): qty 1

## 2012-10-30 MED ORDER — POTASSIUM CHLORIDE 10 MEQ/100ML IV SOLN
10.0000 meq | INTRAVENOUS | Status: AC
  Administered 2012-10-30 (×2): 10 meq via INTRAVENOUS
  Filled 2012-10-30: qty 400

## 2012-10-30 SURGICAL SUPPLY — 17 items
BLOCK BITE 60FR ADLT L/F BLUE (MISCELLANEOUS) ×3 IMPLANT
DEVICE CLIP HEMOSTAT 235CM (CLIP) ×6 IMPLANT
ELECT REM PT RETURN 9FT ADLT (ELECTROSURGICAL)
ELECTRODE REM PT RTRN 9FT ADLT (ELECTROSURGICAL) IMPLANT
FLOOR PAD 36X40 (MISCELLANEOUS) ×3
FORCEPS BIOP RAD 4 LRG CAP 4 (CUTTING FORCEPS) ×3 IMPLANT
MANIFOLD NEPTUNE II (INSTRUMENTS) ×3 IMPLANT
NEEDLE SCLEROTHERAPY 25GX240 (NEEDLE) ×3 IMPLANT
PAD FLOOR 36X40 (MISCELLANEOUS) ×2 IMPLANT
PROBE APC STR FIRE (PROBE) IMPLANT
PROBE INJECTION GOLD (MISCELLANEOUS) ×1
PROBE INJECTION GOLD 7FR (MISCELLANEOUS) ×2 IMPLANT
SNARE SHORT THROW 13M SML OVAL (MISCELLANEOUS) IMPLANT
SYR 50ML LL SCALE MARK (SYRINGE) ×3 IMPLANT
TUBING ENDO SMARTCAP PENTAX (MISCELLANEOUS) ×3 IMPLANT
TUBING IRRIGATION ENDOGATOR (MISCELLANEOUS) ×3 IMPLANT
WATER STERILE IRR 1000ML POUR (IV SOLUTION) ×3 IMPLANT

## 2012-10-30 NOTE — Progress Notes (Addendum)
Subjective: This man had 2 units blood transfusion last night. He still feels somewhat dyspneic. There is no productive cough. He has been stable otherwise hemodynamically overnight. He remains n.p.o. He has no abdominal pain. He denies any further melena.           Physical Exam: Blood pressure 158/95, pulse 119, temperature 97.9 F (36.6 C), temperature source Oral, resp. rate 24, height 5\' 6"  (1.676 m), weight 60.5 kg (133 lb 6.1 oz), SpO2 92.00%. He looks systemically well. He is alert and orientated. Heart sounds are present and in sinus tachycardia. Lung fields show lateral scattered rhonchi. There is few crackles also scattered. There is no bronchial breathing. Abdomen is soft nontender.   Investigations:  Recent Results (from the past 240 hour(s))  MRSA PCR SCREENING     Status: Normal   Collection Time   10/29/12  7:27 PM      Component Value Range Status Comment   MRSA by PCR NEGATIVE  NEGATIVE Final      Basic Metabolic Panel:  Basename 10/30/12 0424 10/29/12 1158  NA 132* 126*  K 3.1* 3.7  CL 95* 89*  CO2 27 30  GLUCOSE 84 127*  BUN 12 23  CREATININE 0.70 0.76  CALCIUM 8.2* 8.8  MG -- --  PHOS -- --   Liver Function Tests:  [facility] 10/30/12 0424 10/29/12 1158  AST 45* 39*  ALT 19 19  ALKPHOS 79 97  BILITOT 0.7 0.4  PROT 6.6 7.6  ALBUMIN 2.1* 2.4*     CBC:  Basename 10/30/12 0424 10/29/12 1158  WBC 27.4* 44.8*  NEUTROABS -- 40.5*  HGB 8.5* 7.4*  HCT 25.1* 21.5*  MCV 91.9 92.3  PLT 473* 559*    Dg Chest 2 View  10/29/2012  *RADIOLOGY REPORT*  Clinical Data: Cough, chest pain, shortness of breath  CHEST - 2 VIEW  Comparison: None.  Findings: Chronic interstitial markings/hyperinflation.  No focal consolidation. No pleural effusion or pneumothorax.  Cardiomediastinal silhouette is within normal limits.  Multiple healed left rib fractures.  Moderate compression deformity at T8, age indeterminate, likely chronic.  IMPRESSION: No evidence  of acute cardiopulmonary disease.   Original Report Authenticated By: Charline Bills, M.D.    Ct Abdomen Pelvis W Contrast  10/29/2012  *RADIOLOGY REPORT*  Clinical Data: Abdominal pain.  Rectal bleeding.  Leukocytosis.  CT ABDOMEN AND PELVIS WITH CONTRAST  Technique:  Multidetector CT imaging of the abdomen and pelvis was performed following the standard protocol during bolus administration of intravenous contrast.  Contrast: OMNIPAQUE IOHEXOL 300 MG/ML  SOLN  Comparison: No priors.  Findings:  Lung Bases: Extensive bronchial wall thickening with patchy peribronchovascular micronodularity and ground-glass attenuation throughout the visualized lung bases.  Abdomen/Pelvis:  The appearance of the liver, gallbladder, pancreas, bilateral adrenal glands and bilateral kidneys is unremarkable.  The patient is status post splenectomy.  Enhancing 3.3 x 2.5 cm lesion adjacent to the splenectomy bed is most characteristic for a splenule.  Assessment of the anatomic pelvis is slightly limited by beam hardening artifact from the patient's right-sided total hip arthroplasty.  Despite this limitation, there is no significant volume of ascites and no pneumoperitoneum.  No pathologic distension of bowel.  No definite pathologic lymphadenopathy identified within the abdomen or pelvis.  Prostate and urinary bladder are unremarkable in appearance.  Normal appendix.  Musculoskeletal: Status post right total hip arthroplasty and PLIF at L5-S1. There are no aggressive appearing lytic or blastic lesions noted in the visualized portions  of the skeleton.  Healing fracture of a lower left rib (likely the posterior aspect of the left tenth rib).  IMPRESSION: 1.  No definite acute findings in the abdomen or pelvis to account for the patient's symptoms. 2.  However, there is extensive bronchial wall thickening with peribronchovascular ground-glass attenuation and micronodularity throughout the visualized lung bases bilaterally,  suggesting widespread multilobar bronchopneumonia. 3.  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 4.  Healing fracture of the posterolateral aspect of the left tenth rib.   Original Report Authenticated By: Trudie Reed, M.D.       Medications: I have reviewed the patient's current medications.  Impression: 1. GI bleed, likely upper. Status post 2 units blood transfusion. Hemodynamic stable. 2. Community-acquired pneumonia, bronchopneumonia. 3. Tobacco abuse. 4. Hypokalemia.     Plan: 1. Transfuse further 2 units. 2. Continue intravenous antibiotics. Continue intravenous Protonix. Replete potassium. 3. Keep n.p.o. and await gastroenterology consultation. I think he does warrant EGD.     LOS: 1 day   Wilson Singer Pager [phone]  10/30/2012, 7:48 AM

## 2012-10-30 NOTE — Transfer of Care (Signed)
  Anesthesia Post-op Note  Patient: Raymond Fox  Procedure(s) Performed: Procedure(s) (LRB) with comments: ESOPHAGOGASTRODUODENOSCOPY (EGD) WITH PROPOFOL (N/A) BIOPSY (N/A) - gastric biopsies  Patient Location: PACU  Anesthesia Type: MAC  Level of Consciousness: awake, alert , oriented and patient cooperative  Airway and Oxygen Therapy: Patient Spontanous Breathing and Patient connected to face mask oxygen  Post-op Pain: mild  Post-op Assessment: Post-op Vital signs reviewed, Patient's Cardiovascular Status Stable, Respiratory Function Stable, Patent Airway and No signs of Nausea or vomiting  Post-op Vital Signs: Reviewed and stable  Complications: No apparent anesthesia complications

## 2012-10-30 NOTE — Progress Notes (Signed)
Blood transfusion is complete. No observed reaction.

## 2012-10-30 NOTE — Progress Notes (Signed)
Utilization Review Complete  

## 2012-10-30 NOTE — Consult Note (Signed)
Referring Provider: Dr Karilyn Cota [facility]) Primary Care Physician:  No primary provider on file. Primary Gastroenterologist:  Dr. Jonette Eva   Reason for Consultation:  GI Bleed, melena, hematochezia  HPI: [name] is a 52 y.o. male admitted with pneumonia & GI bleed.  Hgb 7.4 upon admission, 8.5 today.  He says 4 days ago, he woke up & had black stool.  He has had recurrent episodes until yesterday.  His stomach was upset.  Denies abdominal pain or vomiting.  Denies diarrhea or constipation.  Denies fever or chills.   Denies heartburn, indigestion, nausea, vomiting, dysphagia, odynophagia or anorexia.  He consumes 2-40oz beers daily for 3 years.  Hx polysubstance abuse with marijuana & prescription narcotics.  He last used 2-3 weeks ago.  He tells me he  has no desire to quit drinking or abusing substances.  He also tells me he is "being picked out of the blue to be eaten here at the hospital."  "I am not here to feed people, I want to live & get back home".  He has currently received 2 units PRBCs.  He has been started on IV Protonix 40mg .  He has been made NPO. Denies any recent NSAIDS.  He does not believe he has ever had a colonoscopy or EGD.  Past Medical History  Diagnosis Date  . Rectal bleeding   . Polysubstance abuse     Rx narcotics, marijuana, etoh  . ETOH abuse     Past Surgical History  Procedure Date  . Joint replacement   . Total hip arthroplasty ?2013  . Back surgery   . Colon surgery     ?pt cannot remember    Prior to Admission medications   Not on File    Current Facility-Administered Medications  Medication Dose Route Frequency Provider Last Rate Last Dose  . 0.9 %  sodium chloride infusion   Intravenous Continuous Laray Anger, DO 100 mL/hr at 10/30/12 0600    . influenza  inactive virus vaccine (FLUZONE/FLUARIX) injection 0.5 mL  0.5 mL Intramuscular Tomorrow-1000 Nimish C Gosrani, MD      . levofloxacin (LEVAQUIN) IVPB 750 mg  750 mg  Intravenous Q24H Nimish C Gosrani, MD      . ondansetron (ZOFRAN) tablet 4 mg  4 mg Oral Q6H PRN Nimish Normajean Glasgow, MD       Or  . ondansetron (ZOFRAN) injection 4 mg  4 mg Intravenous Q6H PRN Nimish C Gosrani, MD      . pantoprazole (PROTONIX) injection 40 mg  40 mg Intravenous Q24H Nimish C Gosrani, MD   40 mg at 10/29/12 1613  . potassium chloride 10 mEq in 100 mL IVPB  10 mEq Intravenous Q1 Hr x 4 Nimish C Gosrani, MD      . sodium chloride 0.9 % bolus 500 mL  500 mL Intravenous Once Laray Anger, DO   500 mL at 10/29/12 1515  . sodium chloride 0.9 % injection 3 mL  3 mL Intravenous Q12H Nimish C Gosrani, MD   3 mL at 10/30/12 0403  . vancomycin (VANCOCIN) IVPB 1000 mg/200 mL premix  1,000 mg Intravenous Q12H Laray Anger, DO   1,000 mg at 10/30/12 0403    Allergies as of 10/29/2012 - Review Complete 10/29/2012  Allergen Reaction Noted  . Penicillins Rash 10/29/2012    Family History:There is no known family history of colorectal carcinoma or inflammatory bowel disease.   Problem Relation Age of Onset  . Lung  cancer Father   . Cirrhosis Brother     ETOH  . Drug abuse Sister   . Seizures Brother     History   Social History  . Marital Status: Widowed    Spouse Name: N/A    Number of Children: 1  . Years of Education: N/A   Occupational History  . unemployed    Social History Main Topics  . Smoking status: Former Games developer  . Smokeless tobacco: Not on file  . Alcohol Use: Yes     Comment: 2-40oz daily or more  . Drug Use: Yes     Comment: marijuana, RX narcs  . Sexually Active: Yes   Other Topics Concern  . Not on file   Social History Narrative   Lives w/ mother.      Review of Systems: Gen: see HPI CV: Denies chest pain, angina, palpitations, syncope, orthopnea, PND, peripheral edema, and claudication. Resp: coughing, short of breath on exertion GI: Denies vomiting blood, jaundice, and fecal incontinence.    GU : Denies urinary burning, blood in  urine, urinary frequency, urinary hesitancy, nocturnal urination, and urinary incontinence. MS: Denies joint pain, limitation of movement, and swelling, stiffness, low back pain, extremity pain. Denies muscle weakness, cramps, atrophy.  Derm: Denies rash, itching, dry skin, hives, moles, warts, or unhealing ulcers.  Psych: see HPI, confused at times Heme: Denies bruising or enlarged lymph nodes. Neuro:  Denies any headaches, dizziness, paresthesias. Endo:  Thinks he may be diabetic but unsure  Physical Exam: Vital signs in last 24 hours: Temp:  [97.8 F (36.6 C)-99 F (37.2 C)] 97.8 F (36.6 C) (01/10 0800) Pulse Rate:  [118-140] 119  (01/09 1959) Resp:  [18-31] 26  (01/10 0800) BP: (126-162)/(72-98) 162/87 mmHg (01/10 0800) SpO2:  [91 %-98 %] 91 % (01/10 0800) Weight:  [125 lb 3.5 oz (56.8 kg)-143 lb (64.864 kg)] 133 lb 6.1 oz (60.5 kg) (01/10 0500) Last BM Date: 10/29/12 No LMP for male patient. General:   Alert,  Thin, anxious-appearing, pleasant and cooperative in NAD Head:  Normocephalic and atraumatic. Eyes:  Sclera clear, no icterus.   Conjunctiva pink. Ears:  Normal auditory acuity. Nose:  No deformity, discharge, or lesions. Mouth:  No deformity or lesions,oropharynx pink & moist. Neck:  Supple; no masses or thyromegaly. Lungs:  Clear throughout to auscultation.   No wheezes, crackles, or rhonchi. No acute distress. Heart:  Regular rate and rhythm; no murmurs, clicks, rubs,  or gallops. Abdomen:  Normal bowel sounds.  No bruits.  Soft, non-tender and non-distended without masses, hepatosplenomegaly or hernias noted.  No guarding or rebound tenderness.   Rectal:  Deferred. Msk:  Symmetrical without gross deformities.  Pulses:  Normal pulses noted. Extremities:  No edema. Neurologic:  Alert and oriented x3;  Confused at times. Skin:  Multiple tattoos.  Intact without significant lesions or rashes. Lymph Nodes:  No significant cervical adenopathy. Psych:  Alert and  cooperative. Normal mood and affect.  Intake/Output from previous day: 01/09 0701 - 01/10 0700 In: 2340 [I.V.:1790; Blood:350; IV Piggyback:200] Out: 800 [Urine:800] Intake/Output this shift: Total I/O In: 764.2 [I.V.:764.2] Out: 275 [Urine:275]  Lab Results:  [facility] 10/30/12 0424 10/29/12 1158  WBC 27.4* 44.8*  HGB 8.5* 7.4*  HCT 25.1* 21.5*  PLT 473* 559*   BMET  Basename 10/30/12 0424 10/29/12 1158  NA 132* 126*  K 3.1* 3.7  CL 95* 89*  CO2 27 30  GLUCOSE 84 127*  BUN 12 23  CREATININE 0.70 0.76  CALCIUM 8.2* 8.8   LFT  Basename 10/30/12 0424 10/29/12 1158  PROT 6.6 7.6  ALBUMIN 2.1* 2.4*  AST 45* 39*  ALT 19 19  ALKPHOS 79 97  BILITOT 0.7 0.4  BILIDIR -- --  IBILI -- --  LIPASE -- --  AMYLASE -- --   PT/INR  Basename 10/29/12 1125  LABPROT 14.3  INR 1.13    Studies/Results: Dg Chest 2 View  10/29/2012  *RADIOLOGY REPORT*  Clinical Data: Cough, chest pain, shortness of breath  CHEST - 2 VIEW  Comparison: None.  Findings: Chronic interstitial markings/hyperinflation.  No focal consolidation. No pleural effusion or pneumothorax.  Cardiomediastinal silhouette is within normal limits.  Multiple healed left rib fractures.  Moderate compression deformity at T8, age indeterminate, likely chronic.  IMPRESSION: No evidence of acute cardiopulmonary disease.   Original Report Authenticated By: Charline Bills, M.D.    Ct Abdomen Pelvis W Contrast  10/29/2012  *RADIOLOGY REPORT*  Clinical Data: Abdominal pain.  Rectal bleeding.  Leukocytosis.  CT ABDOMEN AND PELVIS WITH CONTRAST  Technique:  Multidetector CT imaging of the abdomen and pelvis was performed following the standard protocol during bolus administration of intravenous contrast.  Contrast: OMNIPAQUE IOHEXOL 300 MG/ML  SOLN  Comparison: No priors.  Findings:  Lung Bases: Extensive bronchial wall thickening with patchy peribronchovascular micronodularity and ground-glass attenuation throughout the  visualized lung bases.  Abdomen/Pelvis:  The appearance of the liver, gallbladder, pancreas, bilateral adrenal glands and bilateral kidneys is unremarkable.  The patient is status post splenectomy.  Enhancing 3.3 x 2.5 cm lesion adjacent to the splenectomy bed is most characteristic for a splenule.  Assessment of the anatomic pelvis is slightly limited by beam hardening artifact from the patient's right-sided total hip arthroplasty.  Despite this limitation, there is no significant volume of ascites and no pneumoperitoneum.  No pathologic distension of bowel.  No definite pathologic lymphadenopathy identified within the abdomen or pelvis.  Prostate and urinary bladder are unremarkable in appearance.  Normal appendix.  Musculoskeletal: Status post right total hip arthroplasty and PLIF at L5-S1. There are no aggressive appearing lytic or blastic lesions noted in the visualized portions of the skeleton.  Healing fracture of a lower left rib (likely the posterior aspect of the left tenth rib).  IMPRESSION: 1.  No definite acute findings in the abdomen or pelvis to account for the patient's symptoms. 2.  However, there is extensive bronchial wall thickening with peribronchovascular ground-glass attenuation and micronodularity throughout the visualized lung bases bilaterally, suggesting widespread multilobar bronchopneumonia. 3.  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 4.  Healing fracture of the posterolateral aspect of the left tenth rib.   Original Report Authenticated By: Trudie Reed, M.D.     Impression: Raymond Fox is a pleasant 52 y.o. male admitted with pneumonia & GI bleed.  Presented with melena & hematochezia in the setting of chronic alcoholism & polysubstance abuse.  He also has hypokalemia & acute altered mental status.  Hgb 7.4 upon admission, s/p 2 units PRBCs, he has not had appropriate transfusion response.  Suspect rapid UGI bleed secondary to PUD, cannot r/o  esophageal varices, or erosive/ulcerative esophagitis.  EGD ASAP with Dr Darrick Penna in OR with propofol due to polysubstance abuse history.   Plan: 1. Agree with blood transfusion to keep H/H around 9 grams 2. Agree with IV protonix, will increase to BID 3. NPO 4. K repletion prior to Endo, has been ordered by attending 5. EGD with Dr  Fields ASAP 6. Colonoscopy at a later date 7. Altered mental status to be discussed w/ Dr Karilyn Cota   LOS: 1 day   Lorenza Burton  10/30/2012, 8:57 AM [facility]

## 2012-10-30 NOTE — H&P (Addendum)
  Primary Care Physician:  No primary provider on file. Primary Gastroenterologist:  Dr. Darrick Penna  Pre-Procedure History & Physical: HPI:  Raymond Fox is a 52 y.o. male here for MELENA/brbpr. PT DONE UNDER EMERGENCY CONSENT DUE TO HIGH LIKELIHOOD OF GIB AND NEED FOR URGENT EGD W/I 12 HOURS OF ADMISSION.  Past Medical History  Diagnosis Date  . Rectal bleeding   . Polysubstance abuse     Rx narcotics, marijuana, etoh  . ETOH abuse   . Shortness of breath   . Asthma   . Pneumonia 2010  . Anemia     Past Surgical History  Procedure Date  . Joint replacement   . Total hip arthroplasty ?2013  . Back surgery   . Colon surgery     ?pt cannot remember    Prior to Admission medications   Not on File    Allergies as of 10/29/2012 - Review Complete 10/29/2012  Allergen Reaction Noted  . Penicillins Rash 10/29/2012    Family History  Problem Relation Age of Onset  . Lung cancer Father   . Cirrhosis Brother     ETOH  . Drug abuse Sister   . Seizures Brother     History   Social History  . Marital Status: Widowed    Spouse Name: N/A    Number of Children: 1  . Years of Education: N/A   Occupational History  . unemployed    Social History Main Topics  . Smoking status: Former Games developer  . Smokeless tobacco: Not on file  . Alcohol Use: Yes     Comment: 2-40oz daily or more  . Drug Use: Yes     Comment: marijuana, RX narcs  . Sexually Active: Yes   Other Topics Concern  . Not on file   Social History Narrative   Lives w/ mother.      Review of Systems: See HPI, otherwise negative ROS   Physical Exam: BP 165/91  Pulse 108  Temp 98.4 F (36.9 C) (Oral)  Resp 22  Ht 5\' 6"  (1.676 m)  Wt 133 lb 6.1 oz (60.5 kg)  BMI 21.53 kg/m2  SpO2 93% General:   Alert,  pleasant and cooperative in NAD Head:  Normocephalic and atraumatic. Neck:  Supple; Lungs:  Clear throughout to auscultation.    Heart:  Regular rate and rhythm. Abdomen:  Soft, nontender and  nondistended. Normal bowel sounds, without guarding, and without rebound.   Neurologic:  Alert and  oriented x4;  grossly normal neurologically.  Impression/Plan:     MELENA/brbpr  PLAN: 1. EGD TODAY

## 2012-10-30 NOTE — Anesthesia Procedure Notes (Signed)
Procedure Name: MAC Date/Time: 10/30/2012 12:08 PM Performed by: Carolyne Littles, Keywon Mestre L Pre-anesthesia Checklist: Patient identified, Patient being monitored, Emergency Drugs available, Timeout performed and Suction available Oxygen Delivery Method: Non-rebreather mask

## 2012-10-30 NOTE — Addendum Note (Signed)
Addendum  created 10/30/12 1305 by Marolyn Hammock, CRNA   Modules edited:Anesthesia Blocks and Procedures, Inpatient Notes

## 2012-10-30 NOTE — Progress Notes (Signed)
I was called by the nursing staff; the patient presented with GI bleed, reports drinking 1-2 cans of 24 oz of bear daily; staff worried about alcohol withdrawal.   Currently tachycardic 110; comfortable.   He would benefit from prn ativan to prevent alcohol withdrawal We will initiate CIWA.

## 2012-10-30 NOTE — Anesthesia Postprocedure Evaluation (Signed)
  Anesthesia Post-op Note  Patient: Raymond Fox  Procedure(s) Performed: Procedure(s) (LRB) with comments: ESOPHAGOGASTRODUODENOSCOPY (EGD) WITH PROPOFOL (N/A) BIOPSY (N/A) - gastric biopsies  Patient Location: PACU  Anesthesia Type: MAC  Level of Consciousness: awake, alert , oriented and patient cooperative  Airway and Oxygen Therapy: Patient Spontanous Breathing and Patient connected to face mask oxygen  Post-op Pain: mild  Post-op Assessment: Post-op Vital signs reviewed, Patient's Cardiovascular Status Stable, Respiratory Function Stable, Patent Airway and No signs of Nausea or vomiting  Post-op Vital Signs: Reviewed and stable  Complications: No apparent anesthesia complications  

## 2012-10-30 NOTE — Anesthesia Preprocedure Evaluation (Addendum)
Anesthesia Evaluation  Patient identified by MRN, date of birth, ID band Patient confused    Reviewed: Allergy & Precautions, H&P , NPO status , Patient's Chart, lab work & pertinent test results  Airway Mallampati: I TM Distance: >3 FB Neck ROM: Full    Dental  (+) Edentulous Upper and Poor Dentition   Pulmonary shortness of breath, asthma , pneumonia -, unresolved,  + rhonchi         Cardiovascular negative cardio ROS  Rhythm:Regular Rate:Tachycardia     Neuro/Psych negative neurological ROS     GI/Hepatic GERD-  Medicated,(+)     substance abuse  alcohol use and marijuana use,   Endo/Other  negative endocrine ROS  Renal/GU negative Renal ROS     Musculoskeletal   Abdominal Normal abdominal exam  (+)   Peds  Hematology  (+) anemia ,   Anesthesia Other Findings   Reproductive/Obstetrics                         Anesthesia Physical Anesthesia Plan  ASA: III and emergent  Anesthesia Plan: MAC   Post-op Pain Management:    Induction: Intravenous  Airway Management Planned: Mask  Additional Equipment:   Intra-op Plan:   Post-operative Plan:   Informed Consent: I have reviewed the patients History and Physical, chart, labs and discussed the procedure including the risks, benefits and alternatives for the proposed anesthesia with the patient or authorized representative who has indicated his/her understanding and acceptance.     Plan Discussed with: CRNA  Anesthesia Plan Comments: (Propofol sedation. Unable to contact next of kin, pt not fully oriented, I signed consent as emergency consent.)      Anesthesia Quick Evaluation

## 2012-10-30 NOTE — Consult Note (Signed)
REVIEWED. AGREE. 

## 2012-10-30 NOTE — Addendum Note (Signed)
Addendum  created 10/30/12 1257 by Marolyn Hammock, CRNA   Modules edited:Anesthesia Blocks and Procedures, Inpatient Notes

## 2012-10-31 DIAGNOSIS — K922 Gastrointestinal hemorrhage, unspecified: Secondary | ICD-10-CM

## 2012-10-31 LAB — TYPE AND SCREEN
Unit division: 0
Unit division: 0

## 2012-10-31 LAB — COMPREHENSIVE METABOLIC PANEL
ALT: 27 U/L (ref 0–53)
AST: 63 U/L — ABNORMAL HIGH (ref 0–37)
Alkaline Phosphatase: 84 U/L (ref 39–117)
CO2: 29 mEq/L (ref 19–32)
Chloride: 93 mEq/L — ABNORMAL LOW (ref 96–112)
Creatinine, Ser: 0.65 mg/dL (ref 0.50–1.35)
GFR calc non Af Amer: >90 mL/min (ref >90)
Potassium: 3 mEq/L — ABNORMAL LOW (ref 3.5–5.1)
Sodium: 128 mEq/L — ABNORMAL LOW (ref 135–145)
Total Bilirubin: 0.7 mg/dL (ref 0.3–1.2)

## 2012-10-31 LAB — VANCOMYCIN, TROUGH: Vancomycin Tr: 10.5 ug/mL (ref 10.0–20.0)

## 2012-10-31 LAB — CBC
MCV: 92.7 fL (ref 78.0–100.0)
Platelets: 464 10*3/uL — ABNORMAL HIGH (ref 150–400)
RBC: 3.27 MIL/uL — ABNORMAL LOW (ref 4.22–5.81)
WBC: 17.2 10*3/uL — ABNORMAL HIGH (ref 4.0–10.5)

## 2012-10-31 MED ORDER — VANCOMYCIN HCL IN DEXTROSE 1-5 GM/200ML-% IV SOLN
1000.0000 mg | Freq: Three times a day (TID) | INTRAVENOUS | Status: DC
  Administered 2012-10-31 – 2012-11-04 (×13): 1000 mg via INTRAVENOUS
  Filled 2012-10-31 (×16): qty 200

## 2012-10-31 MED ORDER — POTASSIUM CHLORIDE CRYS ER 20 MEQ PO TBCR
40.0000 meq | EXTENDED_RELEASE_TABLET | Freq: Once | ORAL | Status: AC
  Administered 2012-10-31: 40 meq via ORAL
  Filled 2012-10-31: qty 2

## 2012-10-31 NOTE — Anesthesia Postprocedure Evaluation (Signed)
  Anesthesia Post-op Note  Patient: Raymond Fox  Procedure(s) Performed: Procedure(s) (LRB) with comments: ESOPHAGOGASTRODUODENOSCOPY (EGD) WITH PROPOFOL (N/A) BIOPSY (N/A) - gastric biopsies  Patient Location: PACU  Anesthesia Type: MAC  Level of Consciousness: awake, alert , oriented and patient cooperative  Airway and Oxygen Therapy: Patient Spontanous Breathing   Post-op Pain: mild  Post-op Assessment: Post-op Vital signs reviewed, Patient's Cardiovascular Status Stable, Respiratory Function Stable, Patent Airway and No signs of Nausea or vomiting  Post-op Vital Signs: Reviewed and stable  Complications: No apparent anesthesia complications

## 2012-10-31 NOTE — Progress Notes (Signed)
Subjective; Patient denies nausea vomiting or abdominal pain. No melena reported. Objective; BP 136/76  Pulse 104  Temp 98.5 F (36.9 C) (Oral)  Resp 24  Ht 5\' 6"  (1.676 m)  Wt 139 lb 8.8 oz (63.3 kg)  BMI 22.52 kg/m2  SpO2 93% Patient is alert and eating full liquid lunch. Abdomen is soft with mild epigastric tenderness. Lab data; WBC 17.2, H&H 10.2 and 30.3 Platelet count 464K Serum sodium 128, potassium 3.0, chloride 93, CO2 29, glucose 87, BUN 7, creatinine 0.6. Serum albumin 2.0 AST 63 and ALT 27 Assessment; #1. Upper GI bleed secondary to duodenal ulcer status post endoscopic therapy yesterday by Dr. Darrick Penna. Is also found to have gastric ulcers without stigmata of bleed. H&H stable status post transfusion with 2 units of PRBCs. #2. Mildly elevated AST with normal ALT consistent with alcoholic hepatitis. No hepatobiliary abnormalities noted on CT. #3. Malnutrition. #4. Bronchopneumonia for which she is on Levaquin. #5. Alcohol abuse. Patient on lorazepam protocol. #6. Hypokalemia and hyponatremia. Recommendations; Continue IV pantoprazole for now. CBC and comprehensive chemistry panel in am.

## 2012-10-31 NOTE — Progress Notes (Signed)
Subjective: This man had EGD yesterday and bleeding duodenal ulcer was found which was cauterized. He has had no further melena. He also appears to have history of excess alcohol intake and there was some signs of withdrawal. He has been put on a alcohol withdrawal protocol.           Physical Exam: Blood pressure 132/73, pulse 102, temperature 99 F (37.2 C), temperature source Axillary, resp. rate 22, height 5\' 6"  (1.676 m), weight 63.3 kg (139 lb 8.8 oz), SpO2 98.00%. He looks systemically well. He is currently sleepy. Heart sounds are present and in sinus tachycardia. Lung fields show lateral scattered rhonchi. There is few crackles also scattered. There is no bronchial breathing. Abdomen is soft nontender.   Investigations:  Recent Results (from the past 240 hour(s))  MRSA PCR SCREENING     Status: Normal   Collection Time   10/29/12  7:27 PM      Component Value Range Status Comment   MRSA by PCR NEGATIVE  NEGATIVE Final      Basic Metabolic Panel:  Basename 10/30/12 0424 10/29/12 1158  NA 132* 126*  K 3.1* 3.7  CL 95* 89*  CO2 27 30  GLUCOSE 84 127*  BUN 12 23  CREATININE 0.70 0.76  CALCIUM 8.2* 8.8  MG -- --  PHOS -- --   Liver Function Tests:  [facility] 10/30/12 0424 10/29/12 1158  AST 45* 39*  ALT 19 19  ALKPHOS 79 97  BILITOT 0.7 0.4  PROT 6.6 7.6  ALBUMIN 2.1* 2.4*     CBC:  Basename 10/30/12 0918 10/30/12 0424 10/29/12 1158  WBC -- 27.4* 44.8*  NEUTROABS -- -- 40.5*  HGB 8.6* 8.5* --  HCT -- 25.1* 21.5*  MCV -- 91.9 92.3  PLT -- 473* 559*    Dg Chest 2 View  10/29/2012  *RADIOLOGY REPORT*  Clinical Data: Cough, chest pain, shortness of breath  CHEST - 2 VIEW  Comparison: None.  Findings: Chronic interstitial markings/hyperinflation.  No focal consolidation. No pleural effusion or pneumothorax.  Cardiomediastinal silhouette is within normal limits.  Multiple healed left rib fractures.  Moderate compression deformity at T8, age  indeterminate, likely chronic.  IMPRESSION: No evidence of acute cardiopulmonary disease.   Original Report Authenticated By: Charline Bills, M.D.    Ct Abdomen Pelvis W Contrast  10/29/2012  *RADIOLOGY REPORT*  Clinical Data: Abdominal pain.  Rectal bleeding.  Leukocytosis.  CT ABDOMEN AND PELVIS WITH CONTRAST  Technique:  Multidetector CT imaging of the abdomen and pelvis was performed following the standard protocol during bolus administration of intravenous contrast.  Contrast: OMNIPAQUE IOHEXOL 300 MG/ML  SOLN  Comparison: No priors.  Findings:  Lung Bases: Extensive bronchial wall thickening with patchy peribronchovascular micronodularity and ground-glass attenuation throughout the visualized lung bases.  Abdomen/Pelvis:  The appearance of the liver, gallbladder, pancreas, bilateral adrenal glands and bilateral kidneys is unremarkable.  The patient is status post splenectomy.  Enhancing 3.3 x 2.5 cm lesion adjacent to the splenectomy bed is most characteristic for a splenule.  Assessment of the anatomic pelvis is slightly limited by beam hardening artifact from the patient's right-sided total hip arthroplasty.  Despite this limitation, there is no significant volume of ascites and no pneumoperitoneum.  No pathologic distension of bowel.  No definite pathologic lymphadenopathy identified within the abdomen or pelvis.  Prostate and urinary bladder are unremarkable in appearance.  Normal appendix.  Musculoskeletal: Status post right total hip arthroplasty and PLIF at L5-S1.  There are no aggressive appearing lytic or blastic lesions noted in the visualized portions of the skeleton.  Healing fracture of a lower left rib (likely the posterior aspect of the left tenth rib).  IMPRESSION: 1.  No definite acute findings in the abdomen or pelvis to account for the patient's symptoms. 2.  However, there is extensive bronchial wall thickening with peribronchovascular ground-glass attenuation and micronodularity  throughout the visualized lung bases bilaterally, suggesting widespread multilobar bronchopneumonia. 3.  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 4.  Healing fracture of the posterolateral aspect of the left tenth rib.   Original Report Authenticated By: Trudie Reed, M.D.       Medications: I have reviewed the patient's current medications.  Impression: 1. GI bleed, likely upper. Status post 2 units blood transfusion. Hemodynamic stable. Bleeding duodenal ulcer. 2. Community-acquired pneumonia, bronchopneumonia. 3. Tobacco abuse. 4. Hypokalemia.     Plan: 1. Check CBC and complete metabolic panel this morning. 2. Continue with Protonix drip. 2. Continue intravenous antibiotics.     LOS: 2 days   Wilson Singer Pager [phone]  10/31/2012, 7:48 AM

## 2012-10-31 NOTE — Addendum Note (Signed)
Addendum  created 10/31/12 1208 by Marolyn Hammock, CRNA   Modules edited:Notes Section

## 2012-10-31 NOTE — Progress Notes (Signed)
ANTIBIOTIC CONSULT NOTE  Pharmacy Consult for Vancomycin Indication: rule out pneumonia  Allergies  Allergen Reactions  . Penicillins Rash    Patient Measurements: Height: 5\' 6"  (167.6 cm) Weight: 139 lb 8.8 oz (63.3 kg) IBW/kg (Calculated) : 63.8   Vital Signs: Temp: 99 F (37.2 C) (01/11 0400) Temp src: Axillary (01/11 0400) BP: 132/73 mmHg (01/11 0600) Pulse Rate: 102  (01/11 0600) Intake/Output from previous day: 01/10 0701 - 01/11 0700 In: 5470 [P.O.:100; I.V.:4021.7; Blood:538.3; IV Piggyback:810] Out: 1275 [Urine:1275] Intake/Output from this shift:    Labs:  Basename 10/31/12 0747 10/30/12 0918 10/30/12 0424 10/29/12 1158  WBC 17.2* -- 27.4* 44.8*  HGB 10.2* 8.6* 8.5* --  PLT 464* -- 473* 559*  LABCREA -- -- -- --  CREATININE 0.65 -- 0.70 0.76   Estimated Creatinine Clearance: 96.7 ml/min (by C-G formula based on Cr of 0.65).  Basename 10/31/12 0321  VANCOTROUGH 10.5  VANCOPEAK --  VANCORANDOM --  GENTTROUGH --  GENTPEAK --  GENTRANDOM --  TOBRATROUGH --  TOBRAPEAK --  TOBRARND --  AMIKACINPEAK --  AMIKACINTROU --  AMIKACIN --     Microbiology: Recent Results (from the past 720 hour(s))  MRSA PCR SCREENING     Status: Normal   Collection Time   10/29/12  7:27 PM      Component Value Range Status Comment   MRSA by PCR NEGATIVE  NEGATIVE Final     Medical History: Past Medical History  Diagnosis Date  . Rectal bleeding   . Polysubstance abuse     Rx narcotics, marijuana, etoh  . ETOH abuse   . Shortness of breath   . Asthma   . Pneumonia 2010  . Anemia     Medications:  Scheduled:     . [EXPIRED] EPINEPHrine      . folic acid  1 mg Oral Daily  . influenza  inactive virus vaccine  0.5 mL Intramuscular Tomorrow-1000  . levofloxacin (LEVAQUIN) IV  750 mg Intravenous Q24H  . LORazepam  0-4 mg Oral Q6H   Followed by  . LORazepam  0-4 mg Oral Q12H  . multivitamin with minerals  1 tablet Oral Daily  . nicotine  21 mg Transdermal  Daily  . [COMPLETED] pantoprazole (PROTONIX) IV  80 mg Intravenous Once  . [EXPIRED] potassium chloride  10 mEq Intravenous Q1 Hr x 4  . [EXPIRED] potassium chloride  10 mEq Intravenous Q1 Hr x 2  . [COMPLETED] potassium chloride      . sodium chloride  500 mL Intravenous Once  . sodium chloride  3 mL Intravenous Q12H  . thiamine  100 mg Oral Daily   Or  . thiamine  100 mg Intravenous Daily  . vancomycin  1,000 mg Intravenous Q12H  . [DISCONTINUED] pantoprazole (PROTONIX) IV  40 mg Intravenous Q24H  . [DISCONTINUED] pantoprazole (PROTONIX) IV  40 mg Intravenous Q12H   Assessment: 52yo male admitted with CAP on day#3 Vancomycin & Levaquin.  His WBC is improved.  His renal function has been stable.   Vancomycin trough was below goal range today.   Goal of Therapy:  Vancomycin trough level 15-20 mcg/ml  Plan: *  Vancomycin 1gm iv q8hrs *  Recheck vancomycin trough at steady state *  Monitor renal fxn and cultures per protocol *  Duration of therapy per MD (anticipate 8 days)  Elson Clan 10/31/2012,8:35 AM

## 2012-11-01 ENCOUNTER — Encounter (HOSPITAL_COMMUNITY): Payer: Self-pay | Admitting: Internal Medicine

## 2012-11-01 ENCOUNTER — Inpatient Hospital Stay (HOSPITAL_COMMUNITY): Payer: Medicare PPO

## 2012-11-01 DIAGNOSIS — I959 Hypotension, unspecified: Secondary | ICD-10-CM | POA: Diagnosis not present

## 2012-11-01 DIAGNOSIS — F10239 Alcohol dependence with withdrawal, unspecified: Secondary | ICD-10-CM

## 2012-11-01 DIAGNOSIS — Z9081 Acquired absence of spleen: Secondary | ICD-10-CM

## 2012-11-01 DIAGNOSIS — K264 Chronic or unspecified duodenal ulcer with hemorrhage: Secondary | ICD-10-CM

## 2012-11-01 DIAGNOSIS — K7010 Alcoholic hepatitis without ascites: Secondary | ICD-10-CM | POA: Diagnosis present

## 2012-11-01 DIAGNOSIS — E871 Hypo-osmolality and hyponatremia: Secondary | ICD-10-CM | POA: Diagnosis present

## 2012-11-01 DIAGNOSIS — E876 Hypokalemia: Secondary | ICD-10-CM

## 2012-11-01 DIAGNOSIS — K259 Gastric ulcer, unspecified as acute or chronic, without hemorrhage or perforation: Secondary | ICD-10-CM

## 2012-11-01 DIAGNOSIS — D62 Acute posthemorrhagic anemia: Secondary | ICD-10-CM | POA: Diagnosis present

## 2012-11-01 DIAGNOSIS — F1010 Alcohol abuse, uncomplicated: Secondary | ICD-10-CM | POA: Diagnosis present

## 2012-11-01 HISTORY — DX: Gastric ulcer, unspecified as acute or chronic, without hemorrhage or perforation: K25.9

## 2012-11-01 HISTORY — DX: Acute posthemorrhagic anemia: D62

## 2012-11-01 HISTORY — DX: Chronic or unspecified duodenal ulcer with hemorrhage: K26.4

## 2012-11-01 LAB — COMPREHENSIVE METABOLIC PANEL
ALT: 27 U/L (ref 0–53)
AST: 45 U/L — ABNORMAL HIGH (ref 0–37)
Calcium: 8.4 mg/dL (ref 8.4–10.5)
Creatinine, Ser: 0.73 mg/dL (ref 0.50–1.35)
Sodium: 132 mEq/L — ABNORMAL LOW (ref 135–145)
Total Protein: 6.8 g/dL (ref 6.0–8.3)

## 2012-11-01 LAB — BASIC METABOLIC PANEL
BUN: 4 mg/dL — ABNORMAL LOW (ref 6–23)
Calcium: 8.1 mg/dL — ABNORMAL LOW (ref 8.4–10.5)
Chloride: 97 mEq/L (ref 96–112)
Creatinine, Ser: 0.61 mg/dL (ref 0.50–1.35)
GFR calc Af Amer: >90 mL/min (ref >90)
GFR calc Af Amer: >90 mL/min (ref >90)
Glucose, Bld: 96 mg/dL (ref 70–99)
Potassium: 3.4 mEq/L — ABNORMAL LOW (ref 3.5–5.1)
Sodium: 133 mEq/L — ABNORMAL LOW (ref 135–145)

## 2012-11-01 LAB — CBC
MCH: 30.8 pg (ref 26.0–34.0)
MCHC: 33 g/dL (ref 30.0–36.0)
MCV: 93.4 fL (ref 78.0–100.0)
Platelets: 562 10*3/uL — ABNORMAL HIGH (ref 150–400)
RDW: 15.1 % (ref 11.5–15.5)

## 2012-11-01 LAB — MAGNESIUM: Magnesium: 2 mg/dL (ref 1.5–2.5)

## 2012-11-01 MED ORDER — LEVALBUTEROL HCL 0.63 MG/3ML IN NEBU
0.6300 mg | INHALATION_SOLUTION | Freq: Four times a day (QID) | RESPIRATORY_TRACT | Status: DC
  Administered 2012-11-01 – 2012-11-02 (×3): 0.63 mg via RESPIRATORY_TRACT
  Filled 2012-11-01 (×4): qty 3

## 2012-11-01 MED ORDER — POTASSIUM CHLORIDE 10 MEQ/100ML IV SOLN
10.0000 meq | INTRAVENOUS | Status: AC
  Administered 2012-11-01 (×3): 10 meq via INTRAVENOUS
  Filled 2012-11-01 (×3): qty 100

## 2012-11-01 MED ORDER — CLONIDINE HCL 0.1 MG PO TABS
0.1000 mg | ORAL_TABLET | Freq: Two times a day (BID) | ORAL | Status: DC
  Administered 2012-11-01 – 2012-11-04 (×7): 0.1 mg via ORAL
  Filled 2012-11-01 (×7): qty 1

## 2012-11-01 MED ORDER — SODIUM CHLORIDE 0.9 % IV SOLN
INTRAVENOUS | Status: DC
  Administered 2012-11-01 – 2012-11-03 (×3): via INTRAVENOUS
  Filled 2012-11-01 (×5): qty 1000

## 2012-11-01 MED ORDER — NICOTINE 7 MG/24HR TD PT24
7.0000 mg | MEDICATED_PATCH | Freq: Every day | TRANSDERMAL | Status: DC
  Administered 2012-11-01 – 2012-11-04 (×4): 7 mg via TRANSDERMAL
  Filled 2012-11-01 (×9): qty 1

## 2012-11-01 MED ORDER — MAGNESIUM SULFATE 40 MG/ML IJ SOLN
2.0000 g | Freq: Once | INTRAMUSCULAR | Status: AC
  Administered 2012-11-01: 2 g via INTRAVENOUS
  Filled 2012-11-01: qty 50

## 2012-11-01 MED ORDER — POTASSIUM CHLORIDE CRYS ER 20 MEQ PO TBCR
40.0000 meq | EXTENDED_RELEASE_TABLET | ORAL | Status: AC
  Administered 2012-11-01 (×3): 40 meq via ORAL
  Filled 2012-11-01 (×3): qty 2

## 2012-11-01 MED ORDER — ACETAMINOPHEN 500 MG PO TABS
500.0000 mg | ORAL_TABLET | Freq: Four times a day (QID) | ORAL | Status: DC | PRN
  Administered 2012-11-01 – 2012-11-02 (×2): 500 mg via ORAL
  Filled 2012-11-01 (×2): qty 1

## 2012-11-01 NOTE — Progress Notes (Signed)
Subjective; Patient states he is having more difficulty breathing.  He denies nausea vomiting. He complains of epigastric soreness. He denies melena or rectal bleeding. Objective; BP 139/109  Pulse 122  Temp 98.1 F (36.7 C) (Oral)  Resp 28  Ht 5\' 6"  (1.676 m)  Wt 133 lb 9.6 oz (60.6 kg)  BMI 21.56 kg/m2  SpO2 93% He appears pale. Abdomen is flat and soft with mild midepigastric tenderness. No organomegaly or masses. No LE edema noted. Lab data; WBC 15.7, H&H 10.7 and 32.4 and platelet count 562K Serum sodium 132, potassium 2.6, chloride 96, CO2 32 BUN 4, creatinine 0.73 and glucose 105. Bilirubin oh 0.6, AP 72, AST 45, ALT 27 albumin 2.0 Serum calcium 8.4. Serum magnesium 1.7 Chest film shows increased right upper lobe perihilar atelectasis. Assessment; #1. Upper GI bleed inactive. H&H is coming up. He is status post endoscopic therapy for duodenal ulcer felt to be source of GI blood loss. He also had gastric ulcers without stigmata of bleed. #2. Mildly elevated AST most likely secondary to alcoholic hepatitis. His albumin is quite low and this may be multifactorial. His INR 3 days ago was normal. #3. Severe hypokalemia. He is receiving runs of IV KCl. #4. Dyspnea secondary to bronchopneumonia. Recommendations; Continue IV PPI for another 24 hours. H. pylori serology. CBC in a.m.

## 2012-11-01 NOTE — Progress Notes (Signed)
Subjective: The patient is laying in bed, he is being cleaned by the nursing staff. When I asked him where he was, he stated "church ". Upon further questioning, he changed his answer to the hospital. He denies abdominal pain. No nausea and vomiting reported.  Objective: Vital signs in last 24 hours: Filed Vitals:   11/01/12 0300 11/01/12 0400 11/01/12 0500 11/01/12 0600  BP: 136/84 157/110 178/90 148/89  Pulse: 107 117 126 106  Temp:  98.4 F (36.9 C)    TempSrc:  Oral    Resp: 24 25 28 24   Height:      Weight:   60.6 kg (133 lb 9.6 oz)   SpO2: 94% 100% 92% 97%    Intake/Output Summary (Last 24 hours) at 11/01/12 0836 Last data filed at 11/01/12 0600  Gross per 24 hour  Intake   3390 ml  Output    900 ml  Net   2490 ml    Weight change: -2.7 kg (-5 lb 15.2 oz)  Physical exam: General: 52 year old Caucasian man laying in bed, in no acute distress, but he appears tremulous. Lungs: Faint expiratory wheezes and occasional crackles in the bases. Heart: S1, S2, with tachycardia. Abdomen: Positive bowel sounds, soft, mildly to moderately tender in epigastrium, no rebound, no distention. Extremities: Pedal pulses palpable. No pedal edema. Neurologic/psychiatric: He is alert and oriented to himself and to the hospital after he stated he was in church initially. He is not oriented to the year or month, but he follows directions. He is tremulous.  Lab Results: Basic Metabolic Panel:  Basename 11/01/12 0751 11/01/12 0715 11/01/12 0513  NA 133* -- 132*  K 3.4* -- 2.6*  CL 97 -- 96  CO2 29 -- 32  GLUCOSE 96 -- 105*  BUN 4* -- 4*  CREATININE 0.73 -- 0.73  CALCIUM 8.4 -- 8.4  MG -- 1.7 --  PHOS -- -- --   Liver Function Tests:  [facility] 11/01/12 0513 10/31/12 0747  AST 45* 63*  ALT 27 27  ALKPHOS 72 84  BILITOT 0.6 0.7  PROT 6.8 6.3  ALBUMIN 2.0* 2.0*   No results found for this basename: LIPASE:2,AMYLASE:2 in the last 72 hours No results found for this basename:  AMMONIA:2 in the last 72 hours CBC:  Basename 11/01/12 0513 10/31/12 0747 10/29/12 1158  WBC 15.7* 17.2* --  NEUTROABS -- -- 40.5*  HGB 10.7* 10.2* --  HCT 32.4* 30.3* --  MCV 93.4 92.7 --  PLT 562* 464* --   Cardiac Enzymes: No results found for this basename: CKTOTAL:3,CKMB:3,CKMBINDEX:3,TROPONINI:3 in the last 72 hours BNP: No results found for this basename: PROBNP:3 in the last 72 hours D-Dimer: No results found for this basename: DDIMER:2 in the last 72 hours CBG: No results found for this basename: GLUCAP:6 in the last 72 hours Hemoglobin A1C: No results found for this basename: HGBA1C in the last 72 hours Fasting Lipid Panel: No results found for this basename: CHOL,HDL,LDLCALC,TRIG,CHOLHDL,LDLDIRECT in the last 72 hours Thyroid Function Tests:  Basename 10/29/12 1158  TSH 0.618  T4TOTAL --  FREET4 --  T3FREE --  THYROIDAB --   Anemia Panel: No results found for this basename: VITAMINB12,FOLATE,FERRITIN,TIBC,IRON,RETICCTPCT in the last 72 hours Coagulation:  Basename 10/29/12 1125  LABPROT 14.3  INR 1.13   Urine Drug Screen: Drugs of Abuse  No results found for this basename: labopia,  cocainscrnur,  labbenz,  amphetmu,  thcu,  labbarb    Alcohol Level:  Basename 10/29/12 1125  ETH <11  Urinalysis: No results found for this basename: COLORURINE:2,APPERANCEUR:2,LABSPEC:2,PHURINE:2,GLUCOSEU:2,HGBUR:2,BILIRUBINUR:2,KETONESUR:2,PROTEINUR:2,UROBILINOGEN:2,NITRITE:2,LEUKOCYTESUR:2 in the last 72 hours Misc. Labs:   Micro: Recent Results (from the past 240 hour(s))  MRSA PCR SCREENING     Status: Normal   Collection Time   10/29/12  7:27 PM      Component Value Range Status Comment   MRSA by PCR NEGATIVE  NEGATIVE Final     Studies/Results: No results found.  Medications:  Scheduled:   . folic acid  1 mg Oral Daily  . levofloxacin (LEVAQUIN) IV  750 mg Intravenous Q24H  . LORazepam  0-4 mg Oral Q6H   Followed by  . LORazepam  0-4 mg Oral Q12H    . magnesium sulfate 1 - 4 g bolus IVPB  2 g Intravenous Once  . multivitamin with minerals  1 tablet Oral Daily  . nicotine  21 mg Transdermal Daily  . potassium chloride  10 mEq Intravenous Q1 Hr x 3  . potassium chloride  40 mEq Oral Q4H  . sodium chloride  500 mL Intravenous Once  . sodium chloride  3 mL Intravenous Q12H  . thiamine  100 mg Oral Daily   Or  . thiamine  100 mg Intravenous Daily  . vancomycin  1,000 mg Intravenous Q8H   Continuous:   . pantoprozole (PROTONIX) infusion 8 mg/hr (11/01/12 0600)  . sodium chloride 0.9 % 1,000 mL with potassium chloride 40 mEq infusion 75 mL/hr at 11/01/12 0836   WUJ:[id number], LORazepam, LORazepam, ondansetron (ZOFRAN) IV, ondansetron, sodium chloride  Assessment: Principal Problem:  *GI bleed Active Problems:  Bleeding duodenal ulcer  Community acquired pneumonia  Tobacco abuse  Hypokalemia  History of splenectomy  Hyponatremia  Alcoholic hepatitis  Alcohol abuse  Gastric ulcer  Hypotension  Alcohol withdrawal syndrome    1. Upper GI bleed secondary to bleeding duodenal ulcer and gastric ulcers, per EGD by Dr. Darrick Penna on 10/30/2012. Status post cauterization and epinephrine. He is currently on a Protonix drip.  Acute blood loss anemia. Status post 2 units of packed red blood cell transfusions. His hemoglobin was 7.4 on admission. This currently 10.7.  Alcohol abuse with alcohol withdrawal syndrome. The patient was advised to stop drinking completely. Vitamin therapy and the Ativan withdrawal protocol has been started. He is noted to be tremulous and tachycardic. His blood pressure is increasing. We'll add clonidine empirically.  Bronchopneumonia. He is on vancomycin and Levaquin. He had some bronchospasms so he may have superimposed acute bronchitis.  Alcoholic hepatitis/elevated LFTs. This will continue to be followed..  Hypokalemia. Likely secondary to previous IV fluids without potassium and alcohol abuse. His  magnesium level is 1.7. We'll continue potassium chloride supplementation in the IV fluids and orally as ordered.   Hyponatremia. Resolving with normal saline.   Tobacco abuse. We'll continue nicotine replacement therapy. He was advised to stop smoking. He later stated that he was trying to quit, and had weaned himself down to one cigarette per day.  Leukocytosis. His leukocytosis is likely secondary to pneumonia and reactive from the GI bleed in the setting of history of splenectomy. His white blood cell count is trending downward.   HIV is nonreactive. TSH is within normal limits.    Plan:  1. Continue Protonix drip. Was changed to by mouth when recommended by GI. 2. Decrease nicotine patch 7 mg. 3. Add clonidine empirically for rising blood pressure and tachycardia from alcohol withdrawal syndrome.  4. Continue potassium chloride supplementation. We'll check a followup basic metabolic panel and magnesium level at  3:00 today. 5. Add Xopenex for bronchospasms. We'll order a followup chest x-ray. 6. We'll decrease his IV fluids.    LOS: 3 days   Diallo Ponder 11/01/2012, 8:36 AM

## 2012-11-02 DIAGNOSIS — K269 Duodenal ulcer, unspecified as acute or chronic, without hemorrhage or perforation: Secondary | ICD-10-CM

## 2012-11-02 DIAGNOSIS — F1010 Alcohol abuse, uncomplicated: Secondary | ICD-10-CM

## 2012-11-02 DIAGNOSIS — I498 Other specified cardiac arrhythmias: Secondary | ICD-10-CM

## 2012-11-02 DIAGNOSIS — R000 Tachycardia, unspecified: Secondary | ICD-10-CM | POA: Diagnosis not present

## 2012-11-02 DIAGNOSIS — K922 Gastrointestinal hemorrhage, unspecified: Secondary | ICD-10-CM

## 2012-11-02 LAB — CBC
MCH: 32.1 pg (ref 26.0–34.0)
MCHC: 33.5 g/dL (ref 30.0–36.0)
Platelets: 647 10*3/uL — ABNORMAL HIGH (ref 150–400)

## 2012-11-02 LAB — BASIC METABOLIC PANEL
BUN: 4 mg/dL — ABNORMAL LOW (ref 6–23)
Chloride: 101 mEq/L (ref 96–112)
Creatinine, Ser: 0.78 mg/dL (ref 0.50–1.35)
GFR calc Af Amer: >90 mL/min (ref >90)
GFR calc non Af Amer: >90 mL/min (ref >90)
Glucose, Bld: 114 mg/dL — ABNORMAL HIGH (ref 70–99)

## 2012-11-02 LAB — URIC ACID: Uric Acid, Serum: 2.7 mg/dL — ABNORMAL LOW (ref 4.0–7.8)

## 2012-11-02 MED ORDER — POTASSIUM CHLORIDE CRYS ER 20 MEQ PO TBCR
20.0000 meq | EXTENDED_RELEASE_TABLET | Freq: Every day | ORAL | Status: DC
  Administered 2012-11-02 – 2012-11-04 (×3): 20 meq via ORAL
  Filled 2012-11-02 (×3): qty 1

## 2012-11-02 MED ORDER — METHYLPREDNISOLONE SODIUM SUCC 125 MG IJ SOLR
60.0000 mg | Freq: Once | INTRAMUSCULAR | Status: AC
  Administered 2012-11-02: 60 mg via INTRAVENOUS
  Filled 2012-11-02: qty 2

## 2012-11-02 MED ORDER — FUROSEMIDE 10 MG/ML IJ SOLN
20.0000 mg | Freq: Once | INTRAMUSCULAR | Status: AC
  Administered 2012-11-02: 20 mg via INTRAVENOUS
  Filled 2012-11-02: qty 2

## 2012-11-02 MED ORDER — LEVALBUTEROL HCL 0.63 MG/3ML IN NEBU
0.6300 mg | INHALATION_SOLUTION | RESPIRATORY_TRACT | Status: DC
  Administered 2012-11-02 – 2012-11-03 (×7): 0.63 mg via RESPIRATORY_TRACT
  Filled 2012-11-02 (×6): qty 3

## 2012-11-02 MED ORDER — DILTIAZEM HCL ER COATED BEADS 120 MG PO CP24
120.0000 mg | ORAL_CAPSULE | Freq: Every day | ORAL | Status: DC
  Administered 2012-11-02 – 2012-11-04 (×3): 120 mg via ORAL
  Filled 2012-11-02 (×3): qty 1

## 2012-11-02 MED ORDER — IPRATROPIUM BROMIDE 0.02 % IN SOLN
0.5000 mg | RESPIRATORY_TRACT | Status: DC
  Administered 2012-11-02 – 2012-11-03 (×7): 0.5 mg via RESPIRATORY_TRACT
  Filled 2012-11-02 (×7): qty 2.5

## 2012-11-02 MED ORDER — PANTOPRAZOLE SODIUM 40 MG PO TBEC
40.0000 mg | DELAYED_RELEASE_TABLET | Freq: Two times a day (BID) | ORAL | Status: DC
  Administered 2012-11-02 – 2012-11-04 (×3): 40 mg via ORAL
  Filled 2012-11-02 (×3): qty 1

## 2012-11-02 NOTE — Progress Notes (Signed)
Subjective: Appears slightly confused upon awaking but reoriented appropriately. Denies melena, abdominal pain. Girlfriend at bedside.   Objective: Vital signs in last 24 hours: Temp:  [98.1 F (36.7 C)-98.8 F (37.1 C)] 98.6 F (37 C) (01/13 0400) Pulse Rate:  [94-135] 109  (01/13 0600) Resp:  [21-30] 24  (01/13 0600) BP: (112-155)/(78-106) 135/92 mmHg (01/13 0600) SpO2:  [89 %-100 %] 98 % (01/13 0600) Weight:  [132 lb 4.4 oz (60 kg)] 132 lb 4.4 oz (60 kg) (01/13 0500) Last BM Date: 11/01/12 General:   Alert and oriented to person, place.  Head:  Normocephalic and atraumatic. Eyes:  No icterus, sclera clear. Conjuctiva pink.  Heart:  S1, S2 present, tachycardic  Lungs: expiratory wheeze, diminished bases Abdomen:  Bowel sounds present, soft, non-tender, non-distended. No HSM or hernias noted. No rebound or guarding. No masses appreciated  Msk:  Symmetrical without gross deformities. Normal posture. Extremities:  Without clubbing or edema. Neurologic:  Alert and  oriented to person, place, situation Skin:  Warm and dry, intact without significant lesions.    Intake/Output from previous day: 01/12 0701 - 01/13 0700 In: 4213.3 [P.O.:1130; I.V.:1983.3; IV Piggyback:1100] Out: 3300 [Urine:3300] Intake/Output this shift:    Lab Results:  Basename 11/02/12 0423 11/01/12 0513 10/31/12 0747  WBC 13.9* 15.7* 17.2*  HGB 10.6* 10.7* 10.2*  HCT 31.6* 32.4* 30.3*  PLT 647* 562* 464*   BMET  Basename 11/02/12 0500 11/01/12 1434 11/01/12 0751  NA 134* 128* 133*  K 4.1 3.6 3.4*  CL 101 95* 97  CO2 28 29 29   GLUCOSE 114* 122* 96  BUN 4* 4* 4*  CREATININE 0.78 0.61 0.73  CALCIUM 9.0 8.1* 8.4   LFT  Basename 11/01/12 0513 10/31/12 0747  PROT 6.8 6.3  ALBUMIN 2.0* 2.0*  AST 45* 63*  ALT 27 27  ALKPHOS 72 84  BILITOT 0.6 0.7  BILIDIR -- --  IBILI -- --     Studies/Results: Dg Chest Port 1 View  11/01/2012  *RADIOLOGY REPORT*  Clinical Data: Pneumonia  PORTABLE CHEST -  1 VIEW  Comparison: 10/29/2012  Findings: Limited portable exam with rotation.  Increased right upper lobe perihilar streaky density, suspect atelectasis.  Stable heart size and vascularity.  Portion of the left lower lobe is excluded on the film.  No definite focal airspace process, collapse, consolidation, or pneumothorax.  IMPRESSION: Limited rotated portable exam  Increased right upper lobe perihilar atelectasis suspected otherwise stable exam   Original Report Authenticated By: Judie Petit. Miles Costain, M.D.     Assessment: 52 year old male admitted with pneumonia and UGI bleed; EGD with gastric and duodenal ulcers. Duodenal ulcers thought to be source of acute blood loss, s/p endoscopic therapy. H.pylori serology pending. No further evidence of GI bleeding, Hgb stable. Protonix gtt to be discontinued today, convert to PPI po BID.  Mild AST elevation consistent with ETOH abuse.  Plan: Convert to Protonix po BID Follow-up H.pylori serologies TCS as outpatient with Propofol with Dr. Darrick Penna    LOS: 4 days   Gerrit Halls  11/02/2012, 8:38 AM

## 2012-11-02 NOTE — Progress Notes (Signed)
Subjective: The patient is sitting up in bed eating breakfast. He complains of chest congestion. He has no complaints of abdominal pain.  Objective: Vital signs in last 24 hours: Filed Vitals:   11/02/12 0800 11/02/12 0900 11/02/12 0902 11/02/12 0916  BP: 140/89 152/96  152/96  Pulse: 102 117    Temp:      TempSrc:      Resp: 32 25    Height:      Weight:      SpO2: 94% 97% 97%     Intake/Output Summary (Last 24 hours) at 11/02/12 4098 Last data filed at 11/02/12 0600  Gross per 24 hour  Intake   3865 ml  Output   3300 ml  Net    565 ml    Weight change: -0.6 kg (-1 lb 5.2 oz)  Physical exam: General: 52 year old Caucasian man sitting up in bed. but he appears tremulous. Lungs: Rhonchus wheezing bilaterally. Increased from yesterday. Heart: S1, S2, with tachycardia. Abdomen: Positive bowel sounds, soft, mildly to moderately tender in epigastrium, no rebound, no distention. Extremities: Pedal pulses palpable. No pedal edema. Neurologic/psychiatric: He is alert and oriented to himself and to the hospital. He is less tremulous. Cranial nerves II through XII are grossly intact.  Lab Results: Basic Metabolic Panel:  Basename 11/02/12 0500 11/01/12 1434 11/01/12 0715  NA 134* 128* --  K 4.1 3.6 --  CL 101 95* --  CO2 28 29 --  GLUCOSE 114* 122* --  BUN 4* 4* --  CREATININE 0.78 0.61 --  CALCIUM 9.0 8.1* --  MG -- 2.0 1.7  PHOS -- -- --   Liver Function Tests:  [facility] 11/01/12 0513 10/31/12 0747  AST 45* 63*  ALT 27 27  ALKPHOS 72 84  BILITOT 0.6 0.7  PROT 6.8 6.3  ALBUMIN 2.0* 2.0*   No results found for this basename: LIPASE:2,AMYLASE:2 in the last 72 hours  Basename 11/02/12 0424  AMMONIA 35   CBC:  Basename 11/02/12 0423 11/01/12 0513  WBC 13.9* 15.7*  NEUTROABS -- --  HGB 10.6* 10.7*  HCT 31.6* 32.4*  MCV 95.8 93.4  PLT 647* 562*   Cardiac Enzymes: No results found for this basename: CKTOTAL:3,CKMB:3,CKMBINDEX:3,TROPONINI:3 in the last 72  hours BNP: No results found for this basename: PROBNP:3 in the last 72 hours D-Dimer: No results found for this basename: DDIMER:2 in the last 72 hours CBG: No results found for this basename: GLUCAP:6 in the last 72 hours Hemoglobin A1C: No results found for this basename: HGBA1C in the last 72 hours Fasting Lipid Panel: No results found for this basename: CHOL,HDL,LDLCALC,TRIG,CHOLHDL,LDLDIRECT in the last 72 hours Thyroid Function Tests: No results found for this basename: TSH,T4TOTAL,FREET4,T3FREE,THYROIDAB in the last 72 hours Anemia Panel: No results found for this basename: VITAMINB12,FOLATE,FERRITIN,TIBC,IRON,RETICCTPCT in the last 72 hours Coagulation: No results found for this basename: LABPROT:2,INR:2 in the last 72 hours Urine Drug Screen: Drugs of Abuse  No results found for this basename: labopia,  cocainscrnur,  labbenz,  amphetmu,  thcu,  labbarb    Alcohol Level: No results found for this basename: ETH:2 in the last 72 hours Urinalysis: No results found for this basename: COLORURINE:2,APPERANCEUR:2,LABSPEC:2,PHURINE:2,GLUCOSEU:2,HGBUR:2,BILIRUBINUR:2,KETONESUR:2,PROTEINUR:2,UROBILINOGEN:2,NITRITE:2,LEUKOCYTESUR:2 in the last 72 hours Misc. Labs:   Micro: Recent Results (from the past 240 hour(s))  MRSA PCR SCREENING     Status: Normal   Collection Time   10/29/12  7:27 PM      Component Value Range Status Comment   MRSA by PCR NEGATIVE  NEGATIVE Final  Studies/Results: Dg Chest Port 1 View  11/01/2012  *RADIOLOGY REPORT*  Clinical Data: Pneumonia  PORTABLE CHEST - 1 VIEW  Comparison: 10/29/2012  Findings: Limited portable exam with rotation.  Increased right upper lobe perihilar streaky density, suspect atelectasis.  Stable heart size and vascularity.  Portion of the left lower lobe is excluded on the film.  No definite focal airspace process, collapse, consolidation, or pneumothorax.  IMPRESSION: Limited rotated portable exam  Increased right upper lobe  perihilar atelectasis suspected otherwise stable exam   Original Report Authenticated By: Judie Petit. Miles Costain, M.D.     Medications:  Scheduled:    . cloNIDine  0.1 mg Oral BID  . diltiazem  120 mg Oral Daily  . folic acid  1 mg Oral Daily  . ipratropium  0.5 mg Nebulization Q4H  . levalbuterol  0.63 mg Nebulization Q4H  . levofloxacin (LEVAQUIN) IV  750 mg Intravenous Q24H  . LORazepam  0-4 mg Oral Q12H  . multivitamin with minerals  1 tablet Oral Daily  . nicotine  7 mg Transdermal Daily  . sodium chloride  500 mL Intravenous Once  . sodium chloride  3 mL Intravenous Q12H  . thiamine  100 mg Oral Daily   Or  . thiamine  100 mg Intravenous Daily  . vancomycin  1,000 mg Intravenous Q8H   Continuous:    . pantoprozole (PROTONIX) infusion 8 mg/hr (11/02/12 0600)  . sodium chloride 0.9 % 1,000 mL with potassium chloride 40 mEq infusion 50 mL/hr at 11/02/12 0920   ZOX:[id number], hydrALAZINE, LORazepam, LORazepam, ondansetron (ZOFRAN) IV, ondansetron, sodium chloride  Assessment: Principal Problem:  *GI bleed Active Problems:  Bleeding duodenal ulcer  Community acquired pneumonia  Tobacco abuse  Hypokalemia  History of splenectomy  Hyponatremia  Alcoholic hepatitis  Alcohol abuse  Gastric ulcer  Hypotension  Alcohol withdrawal syndrome  Anemia associated with acute blood loss    1. Upper GI bleed secondary to bleeding duodenal ulcer and gastric ulcers, per EGD by Dr. Darrick Penna on 10/30/2012. Status post cauterization and epinephrine. He is currently on a Protonix drip, he should be ending today.  Acute blood loss anemia. Status post 2 units of packed red blood cell transfusions. His hemoglobin was 7.4 on admission. It has been stable above 10 g for the last 2-3 days.  Alcohol abuse with alcohol withdrawal syndrome. The patient was advised to stop drinking completely. Vitamin therapy and the Ativan withdrawal protocol has been started. He is less tremulous, but still tachycardic  and mildly hypertensive. Clonidine was started on 11/01/2012 empirically.  Bronchopneumonia/superimposed acute bronchitis. He has more bronchospasms and rhonchi this morning. We'll continue Levaquin and vancomycin. Will increase the frequency of nebulizers and give him one dose of Solu-Medrol today. Adjust medications thereafter.  Elevated blood pressure with tachycardia. This is presumed to be secondary to alcohol withdrawal syndrome, but could also be secondary to bronchodilators. We'll continue clonidine. We'll at once daily dosing of diltiazem.  Alcoholic hepatitis/elevated LFTs. This will continue to be followed..  Hypokalemia. Likely secondary to previous IV fluids without potassium and alcohol abuse. His magnesium level is 1.7. We'll continue potassium chloride supplementation in the IV fluids and orally as ordered.   Hyponatremia. Resolving with adjustment in IV fluids.   Tobacco abuse. We'll continue nicotine replacement therapy. He was advised to stop smoking. He later stated that he was trying to quit, and had weaned himself down to one cigarette per day.  Leukocytosis. His leukocytosis is likely secondary to pneumonia and reactive from the  GI bleed in the setting of history of splenectomy. His white blood cell count is trending downward.  HIV is nonreactive. TSH is within normal limits.    Plan:  1. Continue Protonix drip, for a total of 72 hours. 2. Start diltiazem at 120 mg daily for treatment of tachycardia and hypertension. Continue clonidine. 3. Add Atrovent to Xopenex nebulizer for treatment of bronchospasms. Give 1 and. Dose of Solu-Medrol. Trial of IV Lasix x1. This may need to be titrated up, but with caution in the setting of peptic ulcer disease. 4. Transfer to telemetry. 5. Further recommendations per GI.     LOS: 4 days   Tanica Gaige 11/02/2012, 9:27 AM

## 2012-11-02 NOTE — Progress Notes (Signed)
UR Chart Review Completed  

## 2012-11-02 NOTE — Progress Notes (Signed)
Pt is pleasantly confused ,working on incentive with some understanding, not much! O2 off

## 2012-11-02 NOTE — Op Note (Addendum)
[facility] [address] [place], [address]   ENDOSCOPY PROCEDURE REPORT  PATIENT: Raymond, Fox  MR#: [id number] BIRTHDATE: [birth year] , 51  yrs. old GENDER: Male  ENDOSCOPIST: Jonette Eva, MD REFERRED BY:  PROCEDURE DATE: 10/30/2012 PROCEDURE:   EGD w/ biopsy , EGD w/ control of bleeding , and EGD w/ directed submucosal injection(s), any substance INDICATIONS:MELENA,BRBPR. MEDICATIONS: MAC sedation, administered by CRNA TOPICAL ANESTHETIC:   Cetacaine Spray  DESCRIPTION OF PROCEDURE:     Physical exam was performed.  Informed consent was obtained from the patient after explaining the benefits, risks, and alternatives to the procedure.  The patient was connected to the monitor and placed in the left lateral position.  Continuous oxygen was provided by nasal cannula and IV medicine administered through an indwelling cannula.  After administration of sedation, the patients esophagus was intubated and the     endoscope was advanced under direct visualization to the second portion of the duodenum.  The scope was removed slowly by carefully examining the color, texture, anatomy, and integrity of the mucosa on the way out.  The patient was recovered in endoscopy and discharged home in satisfactory condition.   ESOPHAGUS: The mucosa of the esophagus appeared normal.   A small hiatal hernia was noted.  STOMACH: Multiple medium sized ulcers were found in the gastric antrum.  Biopsies were taken.  DUODENUM: Two linear and clean-based ulcers ranging between 3-5 mm in size were found in the duodenal bulb.   A single linear and shallow ulcer measuring 3 x 6mm in size with surrounding edema was found in the duodenal bulb.  Hemostasis was attempted by placing two hemoclips on the bleeding site.  COMPLICATIONS:   None  ENDOSCOPIC IMPRESSION: 1.   PUD-CAUSE FOR MELENA AND POSSIBLY BRBPR 2.  HIATAL HERNIA  RECOMMENDATIONS: PROTONIX GTT FOR 72 HRS FULL LIQUID  DIET  IF PT REBLEEDS DOUBT LESION WILL BE AMENABLE TO ENDOSCOPIC THERAPY DUE TO LOCATION, CAUTERY APPLIED, AND EPINEPHRINE USED TO ACHIEVE HEMOSTASIS.  PT AT RISK FOR SML PERFORATION.  WOULD PURSUE ANGIOGRAPHY OR SURGERY IF PT REBLEEDS.  TCS AS OP WITH PROPOFOL TO COMPLETE EVALUATION FOR RECTAL BLEEDING   REPEAT EXAM:   _______________________________ Rosalie DoctorJonette Eva, MD 10/30/2012 12:57 PM       PATIENT NAME:  Raymond, Fox MR#: [id number]

## 2012-11-03 ENCOUNTER — Encounter (HOSPITAL_COMMUNITY): Payer: Self-pay | Admitting: Internal Medicine

## 2012-11-03 DIAGNOSIS — G312 Degeneration of nervous system due to alcohol: Secondary | ICD-10-CM

## 2012-11-03 DIAGNOSIS — F1020 Alcohol dependence, uncomplicated: Secondary | ICD-10-CM

## 2012-11-03 DIAGNOSIS — R768 Other specified abnormal immunological findings in serum: Secondary | ICD-10-CM

## 2012-11-03 DIAGNOSIS — R894 Abnormal immunological findings in specimens from other organs, systems and tissues: Secondary | ICD-10-CM

## 2012-11-03 DIAGNOSIS — F17200 Nicotine dependence, unspecified, uncomplicated: Secondary | ICD-10-CM

## 2012-11-03 HISTORY — DX: Other specified abnormal immunological findings in serum: R76.8

## 2012-11-03 HISTORY — DX: Alcohol dependence, uncomplicated: F10.20

## 2012-11-03 HISTORY — DX: Degeneration of nervous system due to alcohol: G31.2

## 2012-11-03 LAB — BASIC METABOLIC PANEL
BUN: 7 mg/dL (ref 6–23)
CO2: 30 mEq/L (ref 19–32)
Calcium: 8.9 mg/dL (ref 8.4–10.5)
Creatinine, Ser: 0.74 mg/dL (ref 0.50–1.35)

## 2012-11-03 MED ORDER — TIOTROPIUM BROMIDE MONOHYDRATE 18 MCG IN CAPS
18.0000 ug | ORAL_CAPSULE | Freq: Every day | RESPIRATORY_TRACT | Status: DC
  Administered 2012-11-03 – 2012-11-04 (×2): 18 ug via RESPIRATORY_TRACT
  Filled 2012-11-03: qty 5

## 2012-11-03 MED ORDER — LEVALBUTEROL TARTRATE 45 MCG/ACT IN AERO
2.0000 | INHALATION_SPRAY | RESPIRATORY_TRACT | Status: DC
  Administered 2012-11-03 – 2012-11-04 (×5): 2 via RESPIRATORY_TRACT
  Filled 2012-11-03: qty 15

## 2012-11-03 MED ORDER — PREDNISONE 20 MG PO TABS: 20.0000 mg | ORAL_TABLET | Freq: Two times a day (BID) | ORAL | Status: DC

## 2012-11-03 MED ORDER — METHYLPREDNISOLONE SODIUM SUCC 125 MG IJ SOLR
60.0000 mg | Freq: Once | INTRAMUSCULAR | Status: AC
  Administered 2012-11-03: 60 mg via INTRAVENOUS
  Filled 2012-11-03: qty 2

## 2012-11-03 MED ORDER — POTASSIUM CHLORIDE IN NACL 40-0.9 MEQ/L-% IV SOLN
INTRAVENOUS | Status: DC
  Administered 2012-11-03: 12:00:00 via INTRAVENOUS

## 2012-11-03 MED ORDER — LEVALBUTEROL HCL 0.63 MG/3ML IN NEBU: 0.6300 mg | INHALATION_SOLUTION | RESPIRATORY_TRACT | Status: DC | PRN

## 2012-11-03 NOTE — Progress Notes (Signed)
ANTIBIOTIC CONSULT NOTE  Pharmacy Consult for Vancomycin Indication: rule out pneumonia  Allergies  Allergen Reactions  . Penicillins Rash    Patient Measurements: Height: 5\' 6"  (167.6 cm) Weight: 132 lb 4.4 oz (60 kg) IBW/kg (Calculated) : 63.8   Vital Signs: Temp: 97.7 F (36.5 C) (01/14 0544) Temp src: Oral (01/14 0544) BP: 132/86 mmHg (01/14 0544) Pulse Rate: 114  (01/14 0544) Intake/Output from previous day: 01/13 0701 - 01/14 0700 In: 983.3 [I.V.:633.3; IV Piggyback:350] Out: 6000 [Urine:6000] Intake/Output from this shift:    Labs:  Basename 11/03/12 0608 11/02/12 0500 11/02/12 0423 11/01/12 1434 11/01/12 0513  WBC -- -- 13.9* -- 15.7*  HGB -- -- 10.6* -- 10.7*  PLT -- -- 647* -- 562*  LABCREA -- -- -- -- --  CREATININE 0.74 0.78 -- 0.61 --   Estimated Creatinine Clearance: 90.6 ml/min (by C-G formula based on Cr of 0.74). No results found for this basename: VANCOTROUGH:2,VANCOPEAK:2,VANCORANDOM:2,GENTTROUGH:2,GENTPEAK:2,GENTRANDOM:2,TOBRATROUGH:2,TOBRAPEAK:2,TOBRARND:2,AMIKACINPEAK:2,AMIKACINTROU:2,AMIKACIN:2, in the last 72 hours   Microbiology: Recent Results (from the past 720 hour(s))  MRSA PCR SCREENING     Status: Normal   Collection Time   10/29/12  7:27 PM      Component Value Range Status Comment   MRSA by PCR NEGATIVE  NEGATIVE Final     Medical History: Past Medical History  Diagnosis Date  . Rectal bleeding   . Polysubstance abuse     Rx narcotics, marijuana, etoh  . ETOH abuse   . Shortness of breath   . Asthma   . Pneumonia 2010  . Anemia   . Bleeding duodenal ulcer 11/01/2012  . Gastric ulcer 11/01/2012  . Anemia associated with acute blood loss 11/01/2012  . Alcoholic encephalopathy 11/03/2012    Medications:  Scheduled:     . cloNIDine  0.1 mg Oral BID  . diltiazem  120 mg Oral Daily  . folic acid  1 mg Oral Daily  . [COMPLETED] furosemide  20 mg Intravenous Once  . ipratropium  0.5 mg Nebulization Q4H  . levalbuterol  0.63  mg Nebulization Q4H  . levofloxacin (LEVAQUIN) IV  750 mg Intravenous Q24H  . LORazepam  0-4 mg Oral Q12H  . [COMPLETED] methylPREDNISolone (SOLU-MEDROL) injection  60 mg Intravenous Once  . multivitamin with minerals  1 tablet Oral Daily  . nicotine  7 mg Transdermal Daily  . pantoprazole  40 mg Oral BID AC  . potassium chloride  20 mEq Oral Daily  . sodium chloride  500 mL Intravenous Once  . sodium chloride  3 mL Intravenous Q12H  . thiamine  100 mg Oral Daily   Or  . thiamine  100 mg Intravenous Daily  . vancomycin  1,000 mg Intravenous Q8H  . [DISCONTINUED] levalbuterol  0.63 mg Nebulization Q6H   Assessment: 52yo male admitted with CAP on day#6 Vancomycin & Levaquin.  His WBC is improved.  His renal function has been stable.   Vancomycin trough was below goal range and dose was adjusted.   Goal of Therapy:  Vancomycin trough level 15-20 mcg/ml  Plan: *  Vancomycin 1gm iv q8hrs *  Recheck vancomycin trough at steady state *  Monitor renal fxn and cultures per protocol *  Duration of therapy per MD (anticipate 8 days)  Valrie Hart A 11/03/2012,8:38 AM

## 2012-11-03 NOTE — Progress Notes (Signed)
Pt has intermittent periods of delusion and confusion, but will orient appropriately with prompting. Pts girlfriend @ bedside.Stated when pt was "locked up' for the same thing 3-4 months ago, He experienced ETOH withdrawal seizures in jail. This finding reported to Dr. Sherrie Mustache. Pt forgets that he has a condom cath on and requests to go to bathroom. Pt needs one person assistance, His gait is unsteady. Girlfriend states that" pt will never agree to inpatient rehab" and that" He will never stop drinking"Last cigarettes before admittance to hospital, Last THC 1 month ago. Not specific on Rx drug abuse.

## 2012-11-03 NOTE — Progress Notes (Signed)
Subjective: Notes still sore. H.pylori IgG serology POSITIVE. Talking about someone named [employer]. Confused about room switching, thinks he was moved from hospital to hospital. Denies melena or hematochezia. Appetite ok. Talking about driving a race car.   Objective: Vital signs in last 24 hours: Temp:  [97.6 F (36.4 C)-98.6 F (37 C)] 98.2 F (36.8 C) (01/14 1023) Pulse Rate:  [98-134] 111  (01/14 1023) Resp:  [13-30] 20  (01/14 1023) BP: (122-149)/(74-108) 134/74 mmHg (01/14 1023) SpO2:  [82 %-100 %] 99 % (01/14 1023) Last BM Date: 11/01/12 General:   Alert and oriented to person, mild confusion secondary to ETOH withdrawal Head:  Normocephalic and atraumatic. Eyes:  No icterus, sclera clear. Conjuctiva pink.  Heart:  S1, S2 present, no murmurs noted.  Lungs: Scattered rhonchi, slight expiratory wheeze, objectively improved from yesterday Abdomen:  Bowel sounds present, soft, mild TTP epigastric region, non-distended. No HSM or hernias noted. No rebound or guarding. No masses appreciated  Msk:  Symmetrical without gross deformities. Normal posture. Extremities:  Without clubbing or edema. Neurologic:  Alert and  oriented to person, pleasantly confused Skin:  Warm and dry, intact without significant lesions.    Intake/Output from previous day: 01/13 0701 - 01/14 0700 In: 983.3 [I.V.:633.3; IV Piggyback:350] Out: 6000 [Urine:6000] Intake/Output this shift:    Lab Results:  Basename 11/02/12 0423 11/01/12 0513  WBC 13.9* 15.7*  HGB 10.6* 10.7*  HCT 31.6* 32.4*  PLT 647* 562*   BMET  Basename 11/03/12 0608 11/02/12 0500 11/01/12 1434  NA 135 134* 128*  K 3.9 4.1 3.6  CL 99 101 95*  CO2 30 28 29   GLUCOSE 98 114* 122*  BUN 7 4* 4*  CREATININE 0.74 0.78 0.61  CALCIUM 8.9 9.0 8.1*   LFT  Basename 11/01/12 0513  PROT 6.8  ALBUMIN 2.0*  AST 45*  ALT 27  ALKPHOS 72  BILITOT 0.6  BILIDIR --  IBILI --   Assessment: 52 year old male admitted with pneumonia and UGI  bleed; EGD with gastric and duodenal ulcers. Duodenal ulcers thought to be source of acute blood loss, s/p endoscopic therapy. H.pylori serology POSITIVE. Path negative for H.pylori organisms. No further evidence of GI bleeding, Hgb stable.   Mild AST elevation consistent with ETOH abuse.  Plan: Needs empirical treatment for H.pylori due to +serology, will need to discuss best means for administration to ensure compliance (start inpatient and continue outpatient vs starting as outpatient in very near future) TCS as outpatient with Propofol with Dr. Darrick Penna   LOS: 5 days   Raymond Fox  11/03/2012, 10:30 AM

## 2012-11-03 NOTE — Progress Notes (Signed)
Subjective: The patient is sitting up in bed. He is alert. He has no complaints, but he is concerned about where his clothes are.  Objective: Vital signs in last 24 hours: Filed Vitals:   11/02/12 2326 11/03/12 0544 11/03/12 0805 11/03/12 1023  BP:  132/86  134/74  Pulse:  114  111  Temp:  97.7 F (36.5 C)  98.2 F (36.8 C)  TempSrc:  Oral  Oral  Resp:  20  20  Height:      Weight:      SpO2: 97% 96% 82% 99%    Intake/Output Summary (Last 24 hours) at 11/03/12 1206 Last data filed at 11/03/12 0500  Gross per 24 hour  Intake    400 ml  Output   5000 ml  Net  -4600 ml    Weight change:   Physical exam: General: 52 year old Caucasian man sitting up in bed, more alert. Less tremulous. Lungs: No rhonchus wheezing this morning. He still has a few wheezes and crackles bilaterally. Heart: S1, S2, with tachycardia. Abdomen: Positive bowel sounds, soft, mildly  tender in epigastrium, no rebound, no distention. Extremities: Pedal pulses palpable. No pedal edema. Neurologic/psychiatric: He is more alert. He is oriented to the hospital, month, and year. His speech is clearer. He is less confused. Cranial nerves II through XII are grossly intact. No tremor now.  Lab Results: Basic Metabolic Panel:  Basename 11/03/12 0608 11/02/12 0500 11/01/12 1434 11/01/12 0715  NA 135 134* -- --  K 3.9 4.1 -- --  CL 99 101 -- --  CO2 30 28 -- --  GLUCOSE 98 114* -- --  BUN 7 4* -- --  CREATININE 0.74 0.78 -- --  CALCIUM 8.9 9.0 -- --  MG -- -- 2.0 1.7  PHOS -- -- -- --   Liver Function Tests:  [facility] 11/01/12 0513  AST 45*  ALT 27  ALKPHOS 72  BILITOT 0.6  PROT 6.8  ALBUMIN 2.0*   No results found for this basename: LIPASE:2,AMYLASE:2 in the last 72 hours  Basename 11/02/12 0424  AMMONIA 35   CBC:  Basename 11/02/12 0423 11/01/12 0513  WBC 13.9* 15.7*  NEUTROABS -- --  HGB 10.6* 10.7*  HCT 31.6* 32.4*  MCV 95.8 93.4  PLT 647* 562*   Cardiac Enzymes: No results found  for this basename: CKTOTAL:3,CKMB:3,CKMBINDEX:3,TROPONINI:3 in the last 72 hours BNP: No results found for this basename: PROBNP:3 in the last 72 hours D-Dimer: No results found for this basename: DDIMER:2 in the last 72 hours CBG: No results found for this basename: GLUCAP:6 in the last 72 hours Hemoglobin A1C: No results found for this basename: HGBA1C in the last 72 hours Fasting Lipid Panel: No results found for this basename: CHOL,HDL,LDLCALC,TRIG,CHOLHDL,LDLDIRECT in the last 72 hours Thyroid Function Tests: No results found for this basename: TSH,T4TOTAL,FREET4,T3FREE,THYROIDAB in the last 72 hours Anemia Panel: No results found for this basename: VITAMINB12,FOLATE,FERRITIN,TIBC,IRON,RETICCTPCT in the last 72 hours Coagulation: No results found for this basename: LABPROT:2,INR:2 in the last 72 hours Urine Drug Screen: Drugs of Abuse  No results found for this basename: labopia,  cocainscrnur,  labbenz,  amphetmu,  thcu,  labbarb    Alcohol Level: No results found for this basename: ETH:2 in the last 72 hours Urinalysis: No results found for this basename: COLORURINE:2,APPERANCEUR:2,LABSPEC:2,PHURINE:2,GLUCOSEU:2,HGBUR:2,BILIRUBINUR:2,KETONESUR:2,PROTEINUR:2,UROBILINOGEN:2,NITRITE:2,LEUKOCYTESUR:2 in the last 72 hours Misc. Labs:   Micro: Recent Results (from the past 240 hour(s))  MRSA PCR SCREENING     Status: Normal   Collection Time   10/29/12  7:27 PM      Component Value Range Status Comment   MRSA by PCR NEGATIVE  NEGATIVE Final     Studies/Results: No results found.  Medications:  Scheduled:    . cloNIDine  0.1 mg Oral BID  . diltiazem  120 mg Oral Daily  . folic acid  1 mg Oral Daily  . ipratropium  0.5 mg Nebulization Q4H  . levalbuterol  0.63 mg Nebulization Q4H  . levofloxacin (LEVAQUIN) IV  750 mg Intravenous Q24H  . multivitamin with minerals  1 tablet Oral Daily  . nicotine  7 mg Transdermal Daily  . pantoprazole  40 mg Oral BID AC  . potassium  chloride  20 mEq Oral Daily  . sodium chloride  500 mL Intravenous Once  . sodium chloride  3 mL Intravenous Q12H  . thiamine  100 mg Oral Daily   Or  . thiamine  100 mg Intravenous Daily  . vancomycin  1,000 mg Intravenous Q8H   Continuous:    . 0.9 % NaCl with KCl 40 mEq / L     WUJ:[id number], hydrALAZINE, ondansetron (ZOFRAN) IV, ondansetron, sodium chloride  Assessment: Principal Problem:  *GI bleed Active Problems:  Bleeding duodenal ulcer  Community acquired pneumonia  Tobacco abuse  Hypokalemia  History of splenectomy  Hyponatremia  Alcoholic hepatitis  Alcohol abuse  Gastric ulcer  Hypotension  Alcohol withdrawal syndrome  Anemia associated with acute blood loss  Tachycardia  Alcoholic encephalopathy    1. Upper GI bleed secondary to bleeding duodenal ulcer and gastric ulcers, per EGD by Dr. Darrick Penna on 10/30/2012. Status post cauterization and epinephrine. He is status post Protonix drip. He is now on twice a day dosing. H. pylori serology is positive. We'll defer treatment to GI.  Acute blood loss anemia. Status post 2 units of packed red blood cell transfusions. His hemoglobin was 7.4 on admission. It has been stable above 10 g.  Alcohol abuse with alcohol withdrawal syndrome/mild alcoholic encephalopathy. Clinically, this is improving. He is less confused and less tremulous. The patient was advised to stop drinking completely. He is status post vitamin therapy and the Ativan withdrawal protocol has been started. He is less tremulous, but still mildly tachycardic and mildly hypertensive. Clonidine was started on 11/01/2012 empirically.  Bronchopneumonia/superimposed acute bronchitis. He has fewer wheezes and rhonchi, status post IV Lasix and one dose of Solu-Medrol. Nebulizers increased. We'll continue Levaquin and vancomycin.  Elevated blood pressure with tachycardia. This is presumed to be secondary to alcohol withdrawal syndrome, but could also be secondary  to bronchodilators. We'll continue diltiazem and clonidine.  Alcoholic hepatitis/elevated LFTs. This will continue to be followed..  Hypokalemia. Supplement and repleted. It was likely secondary to previous IV fluids without potassium and alcohol abuse. His magnesium level is 1.7. We'll continue potassium chloride supplementation in the IV fluids and orally as ordered.   Hyponatremia. Resolved with adjustment in IV fluids and Lasix.   Tobacco abuse. We'll continue nicotine replacement therapy. He was advised to stop smoking. He later stated that he was trying to quit, and had weaned himself down to one cigarette per day.  Leukocytosis. His leukocytosis is likely secondary to pneumonia and reactive from the GI bleed in the setting of history of splenectomy. His white blood cell count is trending downward.  HIV is nonreactive. TSH is within normal limits.    Plan:  1. PT consult. 2. Advance diet to heart healthy. 3. Defer H. pylori treatment regimen to GI. 4. Social work consult  for alcohol abuse. 5. Disposition: Possible discharge within the next 24-48 hours. We'll await PT recommendation.       LOS: 5 days   Raymond Fox 11/03/2012, 12:06 PM

## 2012-11-03 NOTE — Progress Notes (Addendum)
H PYLORI SEROLOGY POS. GASTRIC BIOPSIES(6) TAKEN FROM PROXIMAL AND DISTAL STOMACH, WHICH INDICATES PT DOES NOT HAVE ACTIVE H PYLORI INFECTION. NO NEED TO TREAT FOR H PYLORI. BID PPI FOR 3 MOS THEN ONCE DAILY.  REVIEWED.

## 2012-11-03 NOTE — Progress Notes (Signed)
Patient called me to room and stated that he saw eyeballs in the box of gloves. They are watching [place] he stated. Will continue to monitor.

## 2012-11-03 NOTE — Evaluation (Signed)
Physical Therapy Evaluation Patient Details Name: Raymond Fox MRN: [id number] DOB: 07/21/1961 Today's Date: 11/03/2012 Time: 1340-1400 PT Time Calculation (min): 20 min  PT Assessment / Plan / Recommendation Clinical Impression  PT is currently disoriented and having difficulty following commands.  Pt normally lives with mother and has an upstairs bedroom.  Pt will benefit from skilled therapy to increase activity tolerance and I in mobility    PT Assessment  Patient needs continued PT services    Follow Up Recommendations  Home health PT    Does the patient have the potential to tolerate intense rehabilitation    no  Barriers to Discharge  confusion      Equipment Recommendations  None recommended by PT    Recommendations for Other Services OT consult   Frequency Min 3X/week    Precautions / Restrictions Precautions Precautions: Fall Restrictions Weight Bearing Restrictions: No        Mobility  Bed Mobility Bed Mobility: Supine to Sit;Sitting - Scoot to Edge of Bed;Sit to Supine Supine to Sit: 6: Modified independent (Device/Increase time) Sitting - Scoot to Edge of Bed: 6: Modified independent (Device/Increase time) Sit to Supine: 6: Modified independent (Device/Increase time) Transfers Transfers: Sit to Stand Sit to Stand: 4: Min guard Ambulation/Gait Ambulation/Gait Assistance: 4: Min guard Ambulation Distance (Feet): 15 Feet Assistive device: 1 person hand held assist Ambulation/Gait Assistance Details: Pt ambulated into bathroom urinated on gown and on floor at this time escorted back to bed so pt could be cleaned and a new gown could be put on,. Gait Pattern: Decreased hip/knee flexion - right;Decreased hip/knee flexion - left;Decreased dorsiflexion - right;Decreased dorsiflexion - left;Decreased step length - right;Decreased step length - left Gait velocity: slow           PT Diagnosis: Difficulty walking;Generalized weakness  PT Problem List:  Decreased strength;Decreased activity tolerance;Decreased balance;Decreased cognition PT Treatment Interventions: Gait training;Stair training;Functional mobility training;Therapeutic activities;Therapeutic exercise   PT Goals Acute Rehab PT Goals PT Goal Formulation: With patient/family Time For Goal Achievement: 11/06/12 Potential to Achieve Goals: Good Pt will go Supine/Side to Sit: with modified independence PT Goal: Supine/Side to Sit - Progress: Goal set today Pt will go Sit to Supine/Side: with modified independence PT Goal: Sit to Supine/Side - Progress: Goal set today Pt will go Sit to Stand: with modified independence PT Goal: Sit to Stand - Progress: Goal set today Pt will Ambulate: 51 - 150 feet;with modified independence PT Goal: Ambulate - Progress: Goal set today Pt will Go Up / Down Stairs: Flight;with supervision;with rail(s) PT Goal: Up/Down Stairs - Progress: Goal set today  Visit Information  Last PT Received On: 11/03/12    Subjective Data  Subjective: Pt does not verbalize unless questioned Patient Stated Goal: none stated.   Prior Functioning  Home Living Lives With:  (mother) Available Help at Discharge: Family Type of Home: House Home Access: Stairs to enter Secretary/administrator of Steps: 3 Entrance Stairs-Rails: Right Home Layout: Two level Alternate Level Stairs-Number of Steps: 12 Alternate Level Stairs-Rails: Right Bathroom Shower/Tub: Engineer, manufacturing systems: Standard Home Adaptive Equipment: None Prior Function Level of Independence: Independent Able to Take Stairs?: Yes Driving: No Vocation: On disability Communication Communication: No difficulties Dominant Hand: Right    Cognition  Overall Cognitive Status: History of cognitive impairments - further impaired Area of Impairment: Attention;Memory;Safety/judgement;Awareness of deficits;Problem solving Arousal/Alertness: Awake/alert Orientation Level: Disoriented  X4 Behavior During Session: WFL for tasks performed Current Attention Level: Divided Safety/Judgement: Decreased awareness of safety  precautions Safety/Judgement - Other Comments: Pt not listening to commands of therapist to increase safety. Cognition - Other Comments: Pt told repeatedly that he had a catherter in and he could urinate; pt pulled catheter 1/2 way off and began to urinate, (nursing notified).    Extremity/Trunk Assessment Right Lower Extremity Assessment RLE ROM/Strength/Tone: [facility] for tasks assessed Left Lower Extremity Assessment LLE ROM/Strength/Tone: WFL for tasks assessed   Balance    End of Session PT - End of Session Equipment Utilized During Treatment: Gait belt Activity Tolerance: Other (comment) (disoriented) Patient left: in bed;with nursing in room;with family/visitor present Nurse Communication: Mobility status  GP     Iris Hairston,CINDY 11/03/2012, 2:48 PM

## 2012-11-04 ENCOUNTER — Encounter (HOSPITAL_COMMUNITY): Payer: Self-pay | Admitting: Internal Medicine

## 2012-11-04 ENCOUNTER — Telehealth: Payer: Self-pay | Admitting: Urgent Care

## 2012-11-04 DIAGNOSIS — K279 Peptic ulcer, site unspecified, unspecified as acute or chronic, without hemorrhage or perforation: Secondary | ICD-10-CM

## 2012-11-04 DIAGNOSIS — K269 Duodenal ulcer, unspecified as acute or chronic, without hemorrhage or perforation: Secondary | ICD-10-CM

## 2012-11-04 DIAGNOSIS — F1910 Other psychoactive substance abuse, uncomplicated: Secondary | ICD-10-CM

## 2012-11-04 DIAGNOSIS — D649 Anemia, unspecified: Secondary | ICD-10-CM

## 2012-11-04 MED ORDER — LEVOFLOXACIN 500 MG PO TABS: 750.0000 mg | ORAL_TABLET | Freq: Every day | ORAL | Status: DC

## 2012-11-04 MED ORDER — ADULT MULTIVITAMIN W/MINERALS CH: 1.0000 | ORAL_TABLET | Freq: Every day | ORAL | Status: DC

## 2012-11-04 MED ORDER — LEVALBUTEROL TARTRATE 45 MCG/ACT IN AERO: 2.0000 | INHALATION_SPRAY | Freq: Three times a day (TID) | RESPIRATORY_TRACT | Status: DC

## 2012-11-04 MED ORDER — PANTOPRAZOLE SODIUM 40 MG PO TBEC: 40.0000 mg | DELAYED_RELEASE_TABLET | Freq: Two times a day (BID) | ORAL | Status: DC

## 2012-11-04 MED ORDER — DILTIAZEM HCL ER COATED BEADS 180 MG PO CP24: 180.0000 mg | ORAL_CAPSULE | Freq: Every day | ORAL | Status: DC

## 2012-11-04 MED ORDER — THIAMINE HCL 100 MG PO TABS: 100.0000 mg | ORAL_TABLET | Freq: Every day | ORAL | Status: DC

## 2012-11-04 NOTE — Discharge Summary (Signed)
Physician Discharge Summary  Raymond Fox ZOX:[id number] DOB: [birth year] DOA: 10/29/2012  PCP: No primary provider on file.  Admit date: 10/29/2012 Discharge date: 11/04/2012  Time spent: Greater than 30 minutes  Recommendations for Outpatient Follow-up:  1. Dr. Darrick Penna office will call the patient with a followup appointment. 2. The patient was instructed to followup at the [facility] to establish primary care. 3. He was encouraged to go to Alcoholics Anonymous meetings.  Discharge Diagnoses:   1. Upper GI bleeding secondary to duodenal ulcer with hemorrhage and gastric ulcerations. Status post cautery and epinephrine to the duodenal ulcer, per EGD by Dr. Jonette Eva on 10/30/2012. 2. H. pylori antibody positive, but biopsy results negative for H. pylori organisms. 3. Community-acquired bronchopneumonia. 4. Alcohol abuse/alcoholism. 5. Alcohol withdrawal syndrome with alcoholic encephalopathy. 6. Hypotension and hyponatremia secondary to hypovolemia. Both resolved. 7. Alcoholic hepatitis. 8. Sinus tachycardia. 9. Resultant hypertension. 10. Tobacco abuse. 11. History of splenectomy. 12. Anemia secondary to acute blood loss. Status post 2 units of packed red blood cell transfusions. The patient's hemoglobin was 7.4 on admission. It stabilized at 10.6 prior to discharge. 13. Hypokalemia.   Discharge Condition: Improved.  Diet recommendation: Heart healthy.  Filed Weights   10/31/12 0400 11/01/12 0500 11/02/12 0500  Weight: 63.3 kg (139 lb 8.8 oz) 60.6 kg (133 lb 9.6 oz) 60 kg (132 lb 4.4 oz)    History of present illness:  The patient is a 52 year old man with a history of degenerative joint disease and alcoholism, who presented to the emergency department on 10/29/2012 with a chief complaint of black stools, productive cough, and shortness of breath. In the emergency department, he was afebrile and tachycardic. His blood pressure was within normal limits. His lab  data were significant for a serum sodium of 126, AST of 39, white blood cell count of 44.8, and normal PT/PTT. His alcohol level was less than 11. CT scan of his abdomen and pelvis revealed no acute intra-abdominal or intra-pelvic findings, but a splenule was noted, status post prior splenectomy. The CT also revealed findings consistent with bronchopneumonia bilaterally. He was admitted for further evaluation and management.  Hospital Course:     Upper GI bleed secondary to bleeding duodenal ulcer and gastric ulcers.  The patient was started on IV Protonix and IV fluid hydration. He was transfused 2 units of packed red blood cells. Gastroenterologist, Dr. Darrick Penna was consulted. She increased the IV Protonix to twice daily. Following her initial assessment, she performed an EGD on 10/30/2012. The results were dictated above. Biopsies were taken of the gastric ulcers. The path report revealed chronic inflammation, but no H. Pylori organisms were present, despite the positive serology. She performed cauterization and epinephrine to the bleeding duodenal site. Protonix was changed to drip formulation for 72 hours before it was changed eventually to b.i.d. dosing orally. His diet was advanced which he tolerated. An outpatient colonoscopy will be pursued in the near future per GI.  Acute blood loss anemia. The patient was transfused 2 units of packed red blood cell. His hemoglobin was 7.4 on admission. It remained stable above 10 g following the transfusions.   Alcohol abuse with alcohol withdrawal syndrome/mild alcoholic encephalopathy.  The patient was started on the Ativan withdrawal protocol with as needed Ativan and vitamin therapy.He was confused, tremulous, and delirious at times. Clonidine was started for hypertension and tachycardia. Clinically he improved The social worker was consulted to address his alcohol abuse. She provided outpatient services that could help with  sobriety. Although the patient  stated that he wanted to quit, his girlfriend was not optimistic. Nevertheless, he was advised to stop drinking completely.   Bronchopneumonia/superimposed acute bronchitis. The patient was started on vancomycin and Levaquin. Bronchodilators were given. Because of the  wheezes and rhonchi, he was given one dose of IV Lasix and one dose of Solu-Medrol. He improved clinically. He was discharged on a few more days of Levaquin. His HIV was nonreactive.  Hypertension with tachycardia.  This was presumed to be secondary to alcohol withdrawal syndrome and bronchodilators. Diltiazem was started after clonidine. Eventually clonidine was discontinued and the diltiazem was continued for ongoing treatment. His TSH was within normal limits.  Alcoholic hepatitis/elevated LFTs.  The patient's LFTs trended downward.  Hypokalemia. Supplemented and repleted in the IV fluids and orally. His magnesium level is 1.7.   Hyponatremia. Resolved with adjustment in the IV fluids and Lasix.   Tobacco abuse.  Nicotine replacement therapy was started. He was advised to stop smoking. He later stated that he was trying to quit, and had weaned himself down to one cigarette per day.       Procedures:  EGD per Dr. Darrick Penna. Dictated above.  Consultations:  Gastroenterologists, Dr. Darrick Penna and Dr. Jena Gauss  Discharge Exam: Filed Vitals:   11/03/12 2345 11/04/12 0609 11/04/12 0750 11/04/12 1154  BP:  146/80    Pulse:  119    Temp:  97.6 F (36.4 C)    TempSrc:  Oral    Resp:  20    Height:      Weight:      SpO2: 88% 96% 94% 88%    General: No acute distress. He is alert and non tremulous. Cardiovascular: S1 S2 with tachycardia. Respiratory: A few scattered wheezes. Abdomen: positive bowel sounds, soft, mild epigastrium tenderness; no distention. Neuro: Alert and oriented x 2. Speech is clear. No tremor or confusion.   Discharge Instructions      Discharge Orders    Future Orders Please Complete By  Expires   Diet - low sodium heart healthy      Increase activity slowly      Discharge instructions      Comments:   Do not drink alcohol. Try to stop smoking. Restart AA meetings. Take medications as prescribed. Do not drive.       Medication List     As of 11/04/2012  2:31 PM    TAKE these medications         diltiazem 180 MG 24 hr capsule   Commonly known as: CARDIZEM CD   Take 1 capsule (180 mg total) by mouth daily. For treatment of your high blood pressure.      levalbuterol 45 MCG/ACT inhaler   Commonly known as: XOPENEX HFA   Inhale 2 puffs into the lungs 3 (three) times daily.      levofloxacin 500 MG tablet   Commonly known as: LEVAQUIN   Take 1.5 tablets (750 mg total) by mouth daily. Antibiotic.      multivitamin with minerals Tabs   Take 1 tablet by mouth daily.      pantoprazole 40 MG tablet   Commonly known as: PROTONIX   Take 1 tablet (40 mg total) by mouth 2 (two) times daily before a meal. For treatment of your ulcer.      thiamine 100 MG tablet   Take 1 tablet (100 mg total) by mouth daily.         Follow-up Information  Follow up with Jonette Eva, MD. ( For follow up of your ulcer. Dr. Darrick Penna' office will call you for the appointment.)    Contact information:   [address] [place] [address] [phone]       Please follow up. (Followup at the [facility] department as discussed.)           The results of significant diagnostics from this hospitalization (including imaging, microbiology, ancillary and laboratory) are listed below for reference.    Significant Diagnostic Studies: Dg Chest 2 View  10/29/2012  *RADIOLOGY REPORT*  Clinical Data: Cough, chest pain, shortness of breath  CHEST - 2 VIEW  Comparison: None.  Findings: Chronic interstitial markings/hyperinflation.  No focal consolidation. No pleural effusion or pneumothorax.  Cardiomediastinal silhouette is within normal limits.   Multiple healed left rib fractures.  Moderate compression deformity at T8, age indeterminate, likely chronic.  IMPRESSION: No evidence of acute cardiopulmonary disease.   Original Report Authenticated By: Charline Bills, M.D.    Ct Abdomen Pelvis W Contrast  10/29/2012  *RADIOLOGY REPORT*  Clinical Data: Abdominal pain.  Rectal bleeding.  Leukocytosis.  CT ABDOMEN AND PELVIS WITH CONTRAST  Technique:  Multidetector CT imaging of the abdomen and pelvis was performed following the standard protocol during bolus administration of intravenous contrast.  Contrast: OMNIPAQUE IOHEXOL 300 MG/ML  SOLN  Comparison: No priors.  Findings:  Lung Bases: Extensive bronchial wall thickening with patchy peribronchovascular micronodularity and ground-glass attenuation throughout the visualized lung bases.  Abdomen/Pelvis:  The appearance of the liver, gallbladder, pancreas, bilateral adrenal glands and bilateral kidneys is unremarkable.  The patient is status post splenectomy.  Enhancing 3.3 x 2.5 cm lesion adjacent to the splenectomy bed is most characteristic for a splenule.  Assessment of the anatomic pelvis is slightly limited by beam hardening artifact from the patient's right-sided total hip arthroplasty.  Despite this limitation, there is no significant volume of ascites and no pneumoperitoneum.  No pathologic distension of bowel.  No definite pathologic lymphadenopathy identified within the abdomen or pelvis.  Prostate and urinary bladder are unremarkable in appearance.  Normal appendix.  Musculoskeletal: Status post right total hip arthroplasty and PLIF at L5-S1. There are no aggressive appearing lytic or blastic lesions noted in the visualized portions of the skeleton.  Healing fracture of a lower left rib (likely the posterior aspect of the left tenth rib).  IMPRESSION: 1.  No definite acute findings in the abdomen or pelvis to account for the patient's symptoms. 2.  However, there is extensive bronchial wall  thickening with peribronchovascular ground-glass attenuation and micronodularity throughout the visualized lung bases bilaterally, suggesting widespread multilobar bronchopneumonia. 3.  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 4.  Healing fracture of the posterolateral aspect of the left tenth rib.   Original Report Authenticated By: Trudie Reed, M.D.    Dg Chest Port 1 View  11/01/2012  *RADIOLOGY REPORT*  Clinical Data: Pneumonia  PORTABLE CHEST - 1 VIEW  Comparison: 10/29/2012  Findings: Limited portable exam with rotation.  Increased right upper lobe perihilar streaky density, suspect atelectasis.  Stable heart size and vascularity.  Portion of the left lower lobe is excluded on the film.  No definite focal airspace process, collapse, consolidation, or pneumothorax.  IMPRESSION: Limited rotated portable exam  Increased right upper lobe perihilar atelectasis suspected otherwise stable exam   Original Report Authenticated By: Judie Petit. Miles Costain, M.D.     Microbiology: Recent Results (from the  past 240 hour(s))  MRSA PCR SCREENING     Status: Normal   Collection Time   10/29/12  7:27 PM      Component Value Range Status Comment   MRSA by PCR NEGATIVE  NEGATIVE Final      Labs: Basic Metabolic Panel:  Lab 11/03/12 1610 11/02/12 0500 11/01/12 1434 11/01/12 0751 11/01/12 0715 11/01/12 0513  NA 135 134* 128* 133* -- 132*  K 3.9 4.1 3.6 3.4* -- 2.6*  CL 99 101 95* 97 -- 96  CO2 30 28 29 29  -- 32  GLUCOSE 98 114* 122* 96 -- 105*  BUN 7 4* 4* 4* -- 4*  CREATININE 0.74 0.78 0.61 0.73 -- 0.73  CALCIUM 8.9 9.0 8.1* 8.4 -- 8.4  MG -- -- 2.0 -- 1.7 --  PHOS -- -- -- -- -- --   Liver Function Tests:  Lab 11/01/12 0513 10/31/12 0747 10/30/12 0424 10/29/12 1158  AST 45* 63* 45* 39*  ALT 27 27 19 19   ALKPHOS 72 84 79 97  BILITOT 0.6 0.7 0.7 0.4  PROT 6.8 6.3 6.6 7.6  ALBUMIN 2.0* 2.0* 2.1* 2.4*   No results found for this basename: LIPASE:5,AMYLASE:5 in the last 168  hours  Lab 11/02/12 0424  AMMONIA 35   CBC:  Lab 11/02/12 0423 11/01/12 0513 10/31/12 0747 10/30/12 0918 10/30/12 0424 10/29/12 1158  WBC 13.9* 15.7* 17.2* -- 27.4* 44.8*  NEUTROABS -- -- -- -- -- 40.5*  HGB 10.6* 10.7* 10.2* 8.6* 8.5* --  HCT 31.6* 32.4* 30.3* -- 25.1* 21.5*  MCV 95.8 93.4 92.7 -- 91.9 92.3  PLT 647* 562* 464* -- 473* 559*   Cardiac Enzymes: No results found for this basename: CKTOTAL:5,CKMB:5,CKMBINDEX:5,TROPONINI:5 in the last 168 hours BNP: BNP (last 3 results) No results found for this basename: PROBNP:3 in the last 8760 hours CBG: No results found for this basename: GLUCAP:5 in the last 168 hours     Signed:  Asante Blanda  [facility] 11/04/2012, 2:31 PM

## 2012-11-04 NOTE — Telephone Encounter (Signed)
Patient inpatient.  May be discharged home soon. Will need followup visit in 4 weeks to set up colonoscopy and followup upper GI bleed w/ me or AS. Thanks

## 2012-11-04 NOTE — Plan of Care (Signed)
Problem: Discharge Progression Outcomes Goal: Barriers To Progression Addressed/Resolved Outcome: Completed/Met Date Met:  11/04/12 Alert oriented Goal: Complications resolved/controlled Outcome: Completed/Met Date Met:  11/04/12 Clips placed to control ulcer bleed Goal: Vaccine documented on D/C instructions Outcome: Completed/Met Date Met:  11/04/12 Pt refused flu and pneumonia vaccine Pt will receive at health dept

## 2012-11-04 NOTE — Progress Notes (Signed)
When I was hanging fluids on the patient he told me to look out the window at the refrigerator and people out there. Will continue to monitor.

## 2012-11-04 NOTE — Care Management Note (Signed)
    Page 1 of 1   11/04/2012     1:05:37 PM   CARE MANAGEMENT NOTE 11/04/2012  Patient:  Raymond Fox, Raymond Fox   Account Number:  [id number]  Date Initiated:  11/04/2012  Documentation initiated by:  Rosemary Holms  Subjective/Objective Assessment:   Pt lives with mother. Girlfriend at bedside with pt. No PCP.     Action/Plan:   Anticipated DC Date:  11/04/2012   Anticipated DC Plan:  HOME/SELF CARE      DC Planning Services  CM consult      Choice offered to / List presented to:             Status of service:  Completed, signed off Medicare Important Message given?   (If response is "NO", the following Medicare IM given date fields will be blank) Date Medicare IM given:   Date Additional Medicare IM given:    Discharge Disposition:  HOME/SELF CARE  Per UR Regulation:    If discussed at Long Length of Stay Meetings, dates discussed:   11/03/2012    Comments:  11/04/12 Rosemary Holms RN BSN CM Gave pt a brochure for the [employer] highlighting the health dept contact. Encouraged pt to make a f/u appt.

## 2012-11-04 NOTE — Progress Notes (Signed)
Subjective: Patient denies abdominal pain, nausea, vomiting, or melena. He is tolerating regular diet well. He is contemplating alcohol cessation.  Objective: Vital signs in last 24 hours: Temp:  [97.6 F (36.4 C)-98.2 F (36.8 C)] 97.6 F (36.4 C) (01/15 0609) Pulse Rate:  [110-129] 119  (01/15 0609) Resp:  [20] 20  (01/15 0609) BP: (130-160)/(74-94) 146/80 mmHg (01/15 0609) SpO2:  [82 %-99 %] 94 % (01/15 0750) Last BM Date: 11/03/12 No LMP for male patient. Body mass index is 21.35 kg/(m^2). General:   Alert, pleasant and cooperative in NAD Eyes:  Sclera clear, no icterus.   Conjunctiva pink. Mouth: oropharynx pink & moist. Heart:  Regular rate and rhythm Abdomen:   Normal bowel sounds.  Soft, nontender and nondistended.  No guarding or rebound tenderness.   Msk:  Symmetrical without gross deformities. Normal posture. Pulses:  Normal pulses noted. Extremities:  Without edema. Neurologic:  Alert and  oriented x4;  grossly normal neurologically. Skin:  Intact without significant lesions or rashes. Cervical Nodes:  No significant cervical adenopathy. Psych:  Alert and cooperative. Normal mood and affect.  Intake/Output from previous day: 01/14 0701 - 01/15 0700 In: 240 [P.O.:240] Out: 3601 [Urine:3600; Stool:1]  Lab Results:  [facility] 11/02/12 0423  WBC 13.9*  HGB 10.6*  HCT 31.6*  PLT 647*   BMET  Basename 11/03/12 0608 11/02/12 0500 11/01/12 1434  NA 135 134* 128*  K 3.9 4.1 3.6  CL 99 101 95*  CO2 30 28 29   GLUCOSE 98 114* 122*  BUN 7 4* 4*  CREATININE 0.74 0.78 0.61  CALCIUM 8.9 9.0 8.1*   Assessment: 1. UGI bleed secondary to gastric/duodenal ulcers:  Resolved.  Multiple biopsies negative for h pylori.  2. Anemia secondary to #1:  Hemoglobin is stable. 3. Polysubstance abuse: Contemplation phase   Plan: 1. BID PPI for 3 months, then daily 2. ETOH/Polysubstance abuse cessation 3. Outpatient colonoscopy. Will arrange followup visit with our office in 4  weeks.  LOS: 6 days   Lorenza Burton  11/04/2012, 7:59 AM

## 2012-11-04 NOTE — Progress Notes (Signed)
Patient would not stay in the bed, continuing to jump up and stated that monsters were in the room with him. Reassured patient nothing was there and he settled down. Will continue to monitor.

## 2012-11-04 NOTE — Clinical Social Work Psychosocial (Signed)
Clinical Social Work Department BRIEF PSYCHOSOCIAL ASSESSMENT 11/04/2012  Patient:  Raymond Fox, Raymond Fox     Account Number:  [id number]     Admit date:  10/29/2012  Clinical Social Worker:  Nancie Neas  Date/Time:  11/04/2012 09:05 AM  Referred by:  Physician  Date Referred:  11/04/2012 Referred for  Substance Abuse   Other Referral:   Interview type:  Patient Other interview type:    PSYCHOSOCIAL DATA Living Status:  FAMILY Admitted from facility:   Level of care:   Primary support name:  [name] Primary support relationship to patient:  PARENT Degree of support available:   supportive per pt    CURRENT CONCERNS Current Concerns  Substance Abuse   Other Concerns:    SOCIAL WORK ASSESSMENT / PLAN CSW met with pt at bedside following referral from MD for substance abuse. Pt alert and oriented to person, place, and situation. He is confused to time. Pt states he lives with his mother who is his best support but also has a girlfriend. Pt admits to drinking about 2 40 oz beers daily. However he said his last use was before New Years as his New Year's resolution was to stop drinking. He states alcohol has "kept him in trouble" particularly regarding his temper. He denies any current drug use, but admits to occasional marijuana use in the past.    Pt said he began drinking at 52 years old. His heaviest consumption was most recent. He has been to inpatient treatment at [facility] about 10 years ago for 28 days. Although he found the treatment helpful, when he left he hung out with the friends he made there and they started drinking again. Grief from loss of family members and stress contributed to his drinking more. Pt states he has stopped on his own several times, with longest period of sobriety over a year. CSW completed SBIRT and pt scored a 21, indicating high risk drinking.    Pt is interested in outpatient follow up at [facility]. He was also provided AA meeting schedule for  the county in addition to Rethinking Drinking booklet. Pt encouraged to follow up with resources.   Assessment/plan status:  Referral to [facility] Other assessment/ plan:   Information/referral to community resources:   Rethinking Drinking booklet  AA  [facility]    PATIENT'S/FAMILY'S RESPONSE TO PLAN OF CARE: Pt states he is ready to stop drinking as he has seen the negative effects and his mother and girlfriend are encouraging him to stop as well. No other needs reported. CSW signing off but can be reconsulted if needed.        Derenda Fennel, [place] [phone]

## 2012-11-07 ENCOUNTER — Encounter (HOSPITAL_COMMUNITY): Payer: Self-pay | Admitting: Internal Medicine

## 2012-11-09 ENCOUNTER — Encounter: Payer: Self-pay | Admitting: Urgent Care

## 2012-11-09 NOTE — Telephone Encounter (Signed)
Pt is aware of OV on 2/17 at 1030 with KJ and appt card was mailed

## 2012-12-07 ENCOUNTER — Encounter (HOSPITAL_COMMUNITY): Payer: Self-pay | Admitting: Pharmacy Technician

## 2012-12-07 ENCOUNTER — Ambulatory Visit (INDEPENDENT_AMBULATORY_CARE_PROVIDER_SITE_OTHER): Payer: Medicare PPO | Admitting: Urgent Care

## 2012-12-07 ENCOUNTER — Encounter: Payer: Self-pay | Admitting: Urgent Care

## 2012-12-07 VITALS — BP 132/92 | HR 88 | Temp 97.5°F | Ht 64.0 in | Wt 134.6 lb

## 2012-12-07 DIAGNOSIS — R7989 Other specified abnormal findings of blood chemistry: Secondary | ICD-10-CM

## 2012-12-07 DIAGNOSIS — D649 Anemia, unspecified: Secondary | ICD-10-CM

## 2012-12-07 DIAGNOSIS — K922 Gastrointestinal hemorrhage, unspecified: Secondary | ICD-10-CM

## 2012-12-07 DIAGNOSIS — F1010 Alcohol abuse, uncomplicated: Secondary | ICD-10-CM

## 2012-12-07 LAB — COMPREHENSIVE METABOLIC PANEL
ALT: 8 U/L (ref 0–53)
AST: 23 U/L (ref 0–37)
Albumin: 4.3 g/dL (ref 3.5–5.2)
Alkaline Phosphatase: 93 U/L (ref 39–117)
Glucose, Bld: 86 mg/dL (ref 70–99)
Potassium: 4.8 mEq/L (ref 3.5–5.3)
Sodium: 137 mEq/L (ref 135–145)
Total Bilirubin: 0.3 mg/dL (ref 0.3–1.2)
Total Protein: 7.2 g/dL (ref 6.0–8.3)

## 2012-12-07 LAB — CBC WITH DIFFERENTIAL/PLATELET
Basophils Absolute: 0 10*3/uL (ref 0.0–0.1)
Basophils Relative: 0 % (ref 0–1)
Eosinophils Absolute: 0.2 10*3/uL (ref 0.0–0.7)
Eosinophils Relative: 2 % (ref 0–5)
MCH: 29.7 pg (ref 26.0–34.0)
MCHC: 34.1 g/dL (ref 30.0–36.0)
MCV: 87.3 fL (ref 78.0–100.0)
Neutrophils Relative %: 44 % (ref 43–77)
Platelets: 474 10*3/uL — ABNORMAL HIGH (ref 150–400)
RBC: 4.64 MIL/uL (ref 4.22–5.81)
RDW: 14.4 % (ref 11.5–15.5)

## 2012-12-07 MED ORDER — PEG 3350-KCL-NA BICARB-NACL 420 G PO SOLR: 4000.0000 mL | ORAL | Status: DC

## 2012-12-07 NOTE — Assessment & Plan Note (Signed)
Quit Jan 2014

## 2012-12-07 NOTE — Assessment & Plan Note (Signed)
Anemia requiring transfusion from GI bleed (PUD)  Recheck CBC prior to colonoscopy

## 2012-12-07 NOTE — Patient Instructions (Addendum)
Colonoscopy with Dr Darrick Penna Please get your labs as soon as possible.  We will call you with results. STOP ASPIRIN for pain You can take tylenol arthritis as directed on box for pain instead Do not drink alcohol with tylenol or use other acetaminophen products. Great job on quitting drinking!  Keep up the good work! Please quit marijuana & smoking next. 1-800-QUIT-NOW for help quitting smoking Be sure to take your Blood pressure pills & follow up with Health Dept for your blood pressure

## 2012-12-07 NOTE — Progress Notes (Signed)
Primary Care Physician:  [facility] Primary Gastroenterologist:  Dr. Jonette Eva  Chief Complaint  Patient presents with  . Follow-up    GI bleed (PUD)    HPI:  Raymond Fox is a 52 y.o. male here for follow up GI bleed from PUD.  He is on BID Pantoprazole 40mg .  Pt states he has been losing weight, but has a great appetite.  Our records show he has had stable weight.  He reports quitting drinking alcohol since in the hospital.  He wants to quit smoking & stop using marijuana. He had anemia & hypokalemia while inpatient.  He denies any further rectal bleeding or melena.  He did receive 2 units PRBCs.  He is here to set up colonoscopy with Dr Darrick Penna in OR w/ propofol.  Denies heartburn, indigestion, nausea, vomiting, dysphagia, odynophagia or anorexia.  He has been taking 3 ASA daily for right hip pain.  Denies other NSAIDS.  EGD 10/30/12 showed multiple gastric ulcers, 2 hemo-clips applied, biopsies negative for H pylori, despite positive H pylori serology.  Vitals - 1 value per visit 12/07/2012 11/04/2012 11/02/2012  Weight (lb) 134.6  132.28    Past Medical History  Diagnosis Date  . Rectal bleeding   . Polysubstance abuse     Rx narcotics, marijuana, etoh  . ETOH abuse   . Shortness of breath   . Asthma   . Pneumonia 2010  . Anemia   . Bleeding duodenal ulcer 11/01/2012  . Gastric ulcer 11/01/2012  . Anemia associated with acute blood loss 11/01/2012  . 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 Helicobacter pylori antibody positive 11/03/2012    But biopsy negative for H. pylori.  Marland Kitchen Hypertension     Past Surgical History  Procedure Laterality Date  . Joint replacement    . Total hip arthroplasty  ?2013  . Back surgery    . Colon surgery      ?pt cannot remember  . Esophagogastroduodenoscopy (egd) with propofol  10/30/2012    Fields-multiple PUD s/p 2 hemoclips, hiatal hernia  . Esophageal biopsy  10/30/2012    Negative for HPylori x2  . Splenectomy       Current Outpatient Prescriptions  Medication Sig Dispense Refill  . diltiazem (CARDIZEM CD) 180 MG 24 hr capsule Take 1 capsule (180 mg total) by mouth daily. For treatment of your high blood pressure.  30 capsule  3  . levalbuterol (XOPENEX HFA) 45 MCG/ACT inhaler Inhale 2 puffs into the lungs 3 (three) times daily.  1 Inhaler  3  . Multiple Vitamin (MULTIVITAMIN WITH MINERALS) TABS Take 1 tablet by mouth daily.      . pantoprazole (PROTONIX) 40 MG tablet Take 1 tablet (40 mg total) by mouth 2 (two) times daily before a meal. For treatment of your ulcer.  60 tablet  3  . thiamine 100 MG tablet Take 1 tablet (100 mg total) by mouth daily.      . polyethylene glycol-electrolytes (TRILYTE) 420 G solution Take 4,000 mLs by mouth as directed.  4000 mL  0   No current facility-administered medications for this visit.    Allergies as of 12/07/2012 - Review Complete 12/07/2012  Allergen Reaction Noted  . Penicillins Rash 10/29/2012    Family History:There is no known family history of colorectal carcinoma   Problem Relation Age of Onset  . Lung cancer Father   . Cirrhosis Brother     ETOH  . Drug abuse Sister   . Seizures Brother  History   Social History  . Marital Status: Widowed    Spouse Name: N/A    Number of Children: 1  . Years of Education: N/A   Occupational History  . unemployed    Social History Main Topics  . Smoking status: Current Every Day Smoker -- 0.50 packs/day for 30 years    Types: Cigarettes  . Smokeless tobacco: Not on file  . Alcohol Use: No     Comment: QUIT Jan 2014, previously 2-40oz daily or more  . Drug Use: Yes     Comment: marijuana, RX narcs  . Sexually Active: Yes   Other Topics Concern  . Not on file   Social History Narrative   Lives w/ mother.      Review of Systems: See HPI, otherwise negative ROS  Physical Exam: BP 132/92  Pulse 88  Temp(Src) 97.5 F (36.4 C) (Oral)  Ht 5\' 4"  (1.626 m)  Wt 134 lb 9.6 oz (61.054 kg)   BMI 23.09 kg/m2 No LMP for male patient. General:   Alert,  Well-developed, well-nourished, pleasant and cooperative in NAD Head:  Normocephalic and atraumatic. Eyes:  Sclera clear, no icterus.   Conjunctiva pink. Ears:  Normal auditory acuity. Nose:  No deformity, discharge, or lesions. Mouth:  No deformity or lesions,oropharynx pink & moist. Neck:  Supple; no masses or thyromegaly. Lungs:  Clear throughout to auscultation.   No wheezes, crackles, or rhonchi. No acute distress. Heart:  Regular rate and rhythm; no murmurs, clicks, rubs,  or gallops. Abdomen:  Normal bowel sounds.  No bruits.  Soft, non-tender and non-distended without masses, hepatosplenomegaly or hernias noted.  No guarding or rebound tenderness.   Rectal:  Deferred. Msk:  Symmetrical without gross deformities. Normal posture. Pulses:  Normal pulses noted. Extremities:  No edema. Neurologic:  Alert and  oriented x4;  grossly normal neurologically. Skin:  Intact without significant lesions or rashes.  +mulitple tattoos. Lymph Nodes:  No significant cervical adenopathy. Psych:  Alert and cooperative. Normal mood and affect.

## 2012-12-07 NOTE — Progress Notes (Signed)
Faxed to PCP

## 2012-12-07 NOTE — Assessment & Plan Note (Signed)
Elevated LFTs while inpatient felt to be secondary to ETOH hepatitis.  Will recheck now that he has quit drinking.

## 2012-12-07 NOTE — Assessment & Plan Note (Addendum)
Raymond Fox is a pleasant 52 y.o. male with recent GI bleed & anemia secondary to PUD.  Biopsies negative for h pylori.  He has done well on BID protonix.  He was commended on ETOH cessation.  He was counseled on discontinuing daily aspirin, quitting marijuana & tobacco cessation.  He also had hematochezia prior to hospital admission & has never had a colonoscopy.  No recent hematochezia.  Colonoscopy with Dr. Darrick Penna to determine source with propofol given hx polysubstance abuse.  Differentials include benign anorectal source, colon polyp or carcinoma, less likely IBD.  I have discussed risks & benefits which include, but are not limited to, bleeding, infection, perforation & drug reaction.  The patient agrees with this plan & written consent will be obtained.    STOP ASPIRIN for pain You can take tylenol arthritis as directed on box for pain instead Do not drink alcohol with tylenol or use other acetaminophen products. Be sure to take your Blood pressure pills & follow up with Health Dept for your blood pressure

## 2012-12-08 NOTE — Progress Notes (Signed)
Quick Note:  Called and informed pt. ______ 

## 2012-12-08 NOTE — Progress Notes (Signed)
Quick Note:  Please let pt know that his lab work looks great! Much better than when he was in the hospital. Keep on the good work! Keep colonoscopy as planned. UX:LKGM,WNUUVOZD D., PA  ______

## 2012-12-09 ENCOUNTER — Encounter (HOSPITAL_COMMUNITY)
Admission: RE | Admit: 2012-12-09 | Discharge: 2012-12-09 | Disposition: A | Discharge status: Home / Self Care | Payer: Medicare PPO | Source: Ambulatory Visit | Attending: Gastroenterology | Admitting: Gastroenterology

## 2012-12-09 ENCOUNTER — Encounter (HOSPITAL_COMMUNITY): Payer: Self-pay

## 2012-12-09 NOTE — Patient Instructions (Signed)
JOVONTE COMMINS  12/09/2012   Your procedure is scheduled on:  Tuesday, 12/15/12  [facility] to [facility] [place] [facility].  Call this number if you have problems the morning of surgery: [phone]   Remember:   Do not eat food or drink liquids after midnight.   Take these medicines the morning of surgery with A SIP OF WATER: diltiazem and albuterol   Do not wear jewelry, make-up or nail polish.  Do not wear lotions, powders, or perfumes. You may wear deodorant.  Do not shave 48 hours prior to surgery. Men may shave face and neck.  Do not bring valuables to the hospital.  Contacts, dentures or bridgework may not be worn into surgery.  Leave suitcase in the car. After surgery it may be brought to your room.  For patients admitted to the hospital, checkout time is 11:00 AM the day of  discharge.   Patients discharged the day of surgery will not be allowed to drive  home.  Name and phone number of your driver: family  Special Instructions: Please follow instructions given to you at Dr. Catha Brow office concerning prep and diet for this Procedure.   Please read over the following fact sheets that you were given: Pain Booklet, Coughing and Deep Breathing, Anesthesia Post-op Instructions and Care and Recovery After Surgery   Colonoscopy Care After Read the instructions outlined below and refer to this sheet in the next few weeks. These discharge instructions provide you with general information on caring for yourself after you leave the hospital. Your doctor may also give you specific instructions. While your treatment has been planned according to the most current medical practices available, unavoidable complications occasionally occur. If you have any problems or questions after discharge, call your doctor. HOME CARE INSTRUCTIONS ACTIVITY:  You may resume your regular activity, but move at a slower pace for the next 24 hours.  Take frequent rest periods for the next 24 hours.  Walking will help  get rid of the air and reduce the bloated feeling in your belly (abdomen).  No driving for 24 hours (because of the medicine (anesthesia) used during the test).  You may shower.  Do not sign any important legal documents or operate any machinery for 24 hours (because of the anesthesia used during the test). NUTRITION:  Drink plenty of fluids.  You may resume your normal diet as instructed by your doctor.  Begin with a light meal and progress to your normal diet. Heavy or fried foods are harder to digest and may make you feel sick to your stomach (nauseated).  Avoid alcoholic beverages for 24 hours or as instructed. MEDICATIONS:  You may resume your normal medications unless your doctor tells you otherwise. WHAT TO EXPECT TODAY:  Some feelings of bloating in the abdomen.  Passage of more gas than usual.  Spotting of blood in your stool or on the toilet paper. IF YOU HAD POLYPS REMOVED DURING THE COLONOSCOPY:  No aspirin products for 7 days or as instructed.  No alcohol for 7 days or as instructed.  Eat a soft diet for the next 24 hours. FINDING OUT THE RESULTS OF YOUR TEST Not all test results are available during your visit. If your test results are not back during the visit, make an appointment with your caregiver to find out the results. Do not assume everything is normal if you have not heard from your caregiver or the medical facility. It is important for you to follow up on  all of your test results.  SEEK IMMEDIATE MEDICAL CARE IF:  You have more than a spotting of blood in your stool.  Your belly is swollen (abdominal distention).  You are nauseated or vomiting.  You have a fever.  You have abdominal pain or discomfort that is severe or gets worse throughout the day. Document Released: 05/21/2004 Document Revised: 12/30/2011 Document Reviewed: 05/19/2008 [facility] Patient Information 2013 [place], [place].

## 2012-12-15 ENCOUNTER — Encounter (HOSPITAL_COMMUNITY): Payer: Self-pay | Admitting: Anesthesiology

## 2012-12-15 ENCOUNTER — Ambulatory Visit (HOSPITAL_COMMUNITY): Payer: Medicare PPO | Admitting: Anesthesiology

## 2012-12-15 ENCOUNTER — Encounter (HOSPITAL_COMMUNITY): Payer: Self-pay | Admitting: *Deleted

## 2012-12-15 ENCOUNTER — Encounter (HOSPITAL_COMMUNITY)
Admission: RE | Disposition: A | Discharge status: Home / Self Care | Payer: Self-pay | Source: Ambulatory Visit | Attending: Gastroenterology

## 2012-12-15 ENCOUNTER — Ambulatory Visit (HOSPITAL_COMMUNITY)
Admission: RE | Admit: 2012-12-15 | Discharge: 2012-12-15 | Disposition: A | Discharge status: Home / Self Care | Payer: Medicare PPO | Source: Ambulatory Visit | Attending: Gastroenterology | Admitting: Gastroenterology

## 2012-12-15 DIAGNOSIS — K2970 Gastritis, unspecified, without bleeding: Secondary | ICD-10-CM

## 2012-12-15 DIAGNOSIS — Z79899 Other long term (current) drug therapy: Secondary | ICD-10-CM | POA: Insufficient documentation

## 2012-12-15 DIAGNOSIS — K2990 Gastroduodenitis, unspecified, without bleeding: Secondary | ICD-10-CM

## 2012-12-15 DIAGNOSIS — K2980 Duodenitis without bleeding: Secondary | ICD-10-CM

## 2012-12-15 DIAGNOSIS — K648 Other hemorrhoids: Secondary | ICD-10-CM

## 2012-12-15 DIAGNOSIS — Z8711 Personal history of peptic ulcer disease: Secondary | ICD-10-CM

## 2012-12-15 DIAGNOSIS — D509 Iron deficiency anemia, unspecified: Secondary | ICD-10-CM | POA: Insufficient documentation

## 2012-12-15 DIAGNOSIS — I10 Essential (primary) hypertension: Secondary | ICD-10-CM | POA: Insufficient documentation

## 2012-12-15 DIAGNOSIS — K921 Melena: Secondary | ICD-10-CM | POA: Insufficient documentation

## 2012-12-15 DIAGNOSIS — K625 Hemorrhage of anus and rectum: Secondary | ICD-10-CM

## 2012-12-15 HISTORY — PX: ESOPHAGOGASTRODUODENOSCOPY (EGD) WITH PROPOFOL: SHX5813

## 2012-12-15 HISTORY — PX: COLONOSCOPY WITH PROPOFOL: SHX5780

## 2012-12-15 SURGERY — COLONOSCOPY WITH PROPOFOL
Anesthesia: Monitor Anesthesia Care

## 2012-12-15 MED ORDER — PROPOFOL 10 MG/ML IV BOLUS
INTRAVENOUS | Status: DC | PRN
  Administered 2012-12-15 (×3): 30 mg via INTRAVENOUS

## 2012-12-15 MED ORDER — ONDANSETRON HCL 4 MG/2ML IJ SOLN
INTRAMUSCULAR | Status: AC
  Filled 2012-12-15: qty 2

## 2012-12-15 MED ORDER — FENTANYL CITRATE 0.05 MG/ML IJ SOLN: 25.0000 ug | INTRAMUSCULAR | Status: DC | PRN

## 2012-12-15 MED ORDER — FENTANYL CITRATE 0.05 MG/ML IJ SOLN
INTRAMUSCULAR | Status: AC
  Filled 2012-12-15: qty 2

## 2012-12-15 MED ORDER — LIDOCAINE HCL (PF) 1 % IJ SOLN
INTRAMUSCULAR | Status: AC
  Filled 2012-12-15: qty 5

## 2012-12-15 MED ORDER — PROPOFOL INFUSION 10 MG/ML OPTIME
INTRAVENOUS | Status: DC | PRN
  Administered 2012-12-15: 125 ug/kg/min via INTRAVENOUS

## 2012-12-15 MED ORDER — MINERAL OIL PO OIL
TOPICAL_OIL | ORAL | Status: AC
  Filled 2012-12-15: qty 30

## 2012-12-15 MED ORDER — FENTANYL CITRATE 0.05 MG/ML IJ SOLN
INTRAMUSCULAR | Status: DC | PRN
  Administered 2012-12-15 (×3): 50 ug via INTRAVENOUS

## 2012-12-15 MED ORDER — LIDOCAINE HCL (CARDIAC) 20 MG/ML IV SOLN
INTRAVENOUS | Status: DC | PRN
  Administered 2012-12-15: 50 mg via INTRAVENOUS

## 2012-12-15 MED ORDER — MIDAZOLAM HCL 2 MG/2ML IJ SOLN
1.0000 mg | INTRAMUSCULAR | Status: DC | PRN
  Administered 2012-12-15: 2 mg via INTRAVENOUS

## 2012-12-15 MED ORDER — GLYCOPYRROLATE 0.2 MG/ML IJ SOLN
INTRAMUSCULAR | Status: AC
  Filled 2012-12-15: qty 1

## 2012-12-15 MED ORDER — FENTANYL CITRATE 0.05 MG/ML IJ SOLN
25.0000 ug | INTRAMUSCULAR | Status: DC | PRN
  Administered 2012-12-15: 25 ug via INTRAVENOUS

## 2012-12-15 MED ORDER — LACTATED RINGERS IV SOLN: INTRAVENOUS | Status: DC

## 2012-12-15 MED ORDER — LACTATED RINGERS IV SOLN
INTRAVENOUS | Status: DC | PRN
  Administered 2012-12-15: 08:00:00 via INTRAVENOUS

## 2012-12-15 MED ORDER — GLYCOPYRROLATE 0.2 MG/ML IJ SOLN
0.2000 mg | Freq: Once | INTRAMUSCULAR | Status: AC
  Administered 2012-12-15: 0.2 mg via INTRAVENOUS

## 2012-12-15 MED ORDER — PROPOFOL 10 MG/ML IV EMUL
INTRAVENOUS | Status: AC
  Filled 2012-12-15: qty 20

## 2012-12-15 MED ORDER — MIDAZOLAM HCL 2 MG/2ML IJ SOLN
INTRAMUSCULAR | Status: AC
  Filled 2012-12-15: qty 2

## 2012-12-15 MED ORDER — WATER FOR IRRIGATION, STERILE IR SOLN
Status: DC | PRN
  Administered 2012-12-15: 1000 mL

## 2012-12-15 MED ORDER — ONDANSETRON HCL 4 MG/2ML IJ SOLN
4.0000 mg | Freq: Once | INTRAMUSCULAR | Status: DC | PRN
Start: 2012-12-15 — End: 2012-12-15

## 2012-12-15 MED ORDER — ONDANSETRON HCL 4 MG/2ML IJ SOLN
4.0000 mg | Freq: Once | INTRAMUSCULAR | Status: AC
Start: 2012-12-15 — End: 2012-12-15
  Administered 2012-12-15: 4 mg via INTRAVENOUS

## 2012-12-15 MED ORDER — STERILE WATER FOR IRRIGATION IR SOLN
Status: DC | PRN
  Administered 2012-12-15: 08:00:00

## 2012-12-15 SURGICAL SUPPLY — 21 items
ELECT REM PT RETURN 9FT ADLT (ELECTROSURGICAL)
ELECTRODE REM PT RTRN 9FT ADLT (ELECTROSURGICAL) IMPLANT
FCP BXJMBJMB 240X2.8X (CUTTING FORCEPS)
FLOOR PAD 36X40 (MISCELLANEOUS) ×2
FORCEPS BIOP RAD 4 LRG CAP 4 (CUTTING FORCEPS) IMPLANT
FORCEPS BIOP RJ4 240 W/NDL (CUTTING FORCEPS)
FORCEPS BXJMBJMB 240X2.8X (CUTTING FORCEPS) IMPLANT
INJECTOR/SNARE I SNARE (MISCELLANEOUS) IMPLANT
LUBRICANT JELLY 4.5OZ STERILE (MISCELLANEOUS) ×2 IMPLANT
MANIFOLD NEPTUNE II (INSTRUMENTS) ×2 IMPLANT
NEEDLE SCLEROTHERAPY 25GX240 (NEEDLE) IMPLANT
PAD FLOOR 36X40 (MISCELLANEOUS) ×1 IMPLANT
PROBE APC STR FIRE (PROBE) IMPLANT
PROBE INJECTION GOLD (MISCELLANEOUS)
PROBE INJECTION GOLD 7FR (MISCELLANEOUS) IMPLANT
SNARE SHORT THROW 13M SML OVAL (MISCELLANEOUS) ×2 IMPLANT
SYR 50ML LL SCALE MARK (SYRINGE) ×2 IMPLANT
TRAP SPECIMEN MUCOUS 40CC (MISCELLANEOUS) IMPLANT
TUBING ENDO SMARTCAP PENTAX (MISCELLANEOUS) IMPLANT
TUBING IRRIGATION ENDOGATOR (MISCELLANEOUS) ×2 IMPLANT
WATER STERILE IRR 1000ML POUR (IV SOLUTION) ×4 IMPLANT

## 2012-12-15 NOTE — Anesthesia Preprocedure Evaluation (Signed)
Anesthesia Evaluation  Patient identified by MRN, date of birth, ID band Patient confused    Reviewed: Allergy & Precautions, H&P , NPO status , Patient's Chart, lab work & pertinent test results  Airway Mallampati: I TM Distance: >3 FB Neck ROM: Full    Dental  (+) Edentulous Upper and Poor Dentition   Pulmonary shortness of breath, asthma , pneumonia -, unresolved,  + rhonchi         Cardiovascular hypertension, negative cardio ROS  Rhythm:Regular Rate:Tachycardia     Neuro/Psych negative neurological ROS     GI/Hepatic PUD, GERD-  Medicated,(+)     substance abuse  alcohol use and marijuana use, Hepatitis -  Endo/Other  negative endocrine ROS  Renal/GU negative Renal ROS     Musculoskeletal   Abdominal Normal abdominal exam  (+)   Peds  Hematology   Anesthesia Other Findings   Reproductive/Obstetrics                           Anesthesia Physical Anesthesia Plan  ASA: III and emergent  Anesthesia Plan: MAC   Post-op Pain Management:    Induction: Intravenous  Airway Management Planned: Mask  Additional Equipment:   Intra-op Plan:   Post-operative Plan:   Informed Consent: I have reviewed the patients History and Physical, chart, labs and discussed the procedure including the risks, benefits and alternatives for the proposed anesthesia with the patient or authorized representative who has indicated his/her understanding and acceptance.     Plan Discussed with: CRNA  Anesthesia Plan Comments: (Propofol sedation. Unable to contact next of kin, pt not fully oriented, I signed consent as emergency consent.)        Anesthesia Quick Evaluation

## 2012-12-15 NOTE — Transfer of Care (Signed)
Immediate Anesthesia Transfer of Care Note  Patient: Raymond Fox  Procedure(s) Performed: Procedure(s) with comments: COLONOSCOPY WITH PROPOFOL (N/A) - in cecum at 0751; end at 0800 ; total withdrawal time = 9 minutes ESOPHAGOGASTRODUODENOSCOPY (EGD) WITH PROPOFOL (N/A) - start at 0806  Patient Location: PACU  Anesthesia Type:MAC  Level of Consciousness: awake, alert  and oriented  Airway & Oxygen Therapy: Patient Spontanous Breathing and Patient connected to face mask oxygen  Post-op Assessment: Report given to PACU RN and Post -op Vital signs reviewed and stable  Post vital signs: Reviewed and stable  Complications: No apparent anesthesia complications

## 2012-12-15 NOTE — Preoperative (Signed)
Beta Blockers   Reason not to administer Beta Blockers:Not Applicable 

## 2012-12-15 NOTE — Addendum Note (Signed)
Addendum created 12/15/12 1007 by Franco Nones, CRNA   Modules edited: Charges VN

## 2012-12-15 NOTE — H&P (Signed)
Primary Care Physician:  Tylene Fantasia., PA Primary Gastroenterologist:  Dr. Darrick Penna  Pre-Procedure History & Physical: HPI:  Raymond Fox is a 52 y.o. male here for BRBPR/pud.   Past Medical History  Diagnosis Date  . Rectal bleeding   . Polysubstance abuse     Rx narcotics, marijuana, etoh  . ETOH abuse   . Shortness of breath   . Asthma   . Pneumonia 2010  . Anemia   . Bleeding duodenal ulcer 11/01/2012  . Gastric ulcer 11/01/2012  . Anemia associated with acute blood loss 11/01/2012  . 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 Helicobacter pylori antibody positive 11/03/2012    But biopsy negative for H. pylori.  Marland Kitchen Hypertension     Past Surgical History  Procedure Laterality Date  . Joint replacement    . Total hip arthroplasty  ?2013  . Back surgery    . Colon surgery      ?pt cannot remember  . Esophagogastroduodenoscopy (egd) with propofol  10/30/2012    Kenyatta Keidel-multiple PUD s/p 2 hemoclips, hiatal hernia  . Esophageal biopsy  10/30/2012    Negative for HPylori x2  . Splenectomy      Prior to Admission medications   Medication Sig Start Date End Date Taking? Authorizing Provider  diltiazem (CARDIZEM CD) 180 MG 24 hr capsule Take 1 capsule (180 mg total) by mouth daily. For treatment of your high blood pressure. 11/04/12  Yes Elliot Cousin, MD  levalbuterol [facility] HFA) 45 MCG/ACT inhaler Inhale 2 puffs into the lungs 3 (three) times daily. 11/04/12  Yes Elliot Cousin, MD  Multiple Vitamin (MULTIVITAMIN WITH MINERALS) TABS Take 1 tablet by mouth daily. 11/04/12  Yes Elliot Cousin, MD  pantoprazole (PROTONIX) 40 MG tablet Take 1 tablet (40 mg total) by mouth 2 (two) times daily before a meal. For treatment of your ulcer. 11/04/12  Yes Elliot Cousin, MD  polyethylene glycol-electrolytes (TRILYTE) 420 G solution Take 4,000 mLs by mouth as directed. 12/07/12  Yes West Bali, MD  thiamine 100 MG tablet Take 1 tablet (100 mg total) by mouth daily. 11/04/12  Yes Elliot Cousin, MD    Allergies as of 12/07/2012 - Review Complete 12/07/2012  Allergen Reaction Noted  . Penicillins Rash 10/29/2012    Family History  Problem Relation Age of Onset  . Lung cancer Father   . Cirrhosis Brother     ETOH  . Drug abuse Sister   . Seizures Brother     History   Social History  . Marital Status: Widowed    Spouse Name: N/A    Number of Children: 1  . Years of Education: N/A   Occupational History  . unemployed    Social History Main Topics  . Smoking status: Current Every Day Smoker -- 0.50 packs/day for 30 years    Types: Cigarettes  . Smokeless tobacco: Not on file  . Alcohol Use: No     Comment: QUIT Jan 2014, previously 2-40oz daily or more  . Drug Use: Yes     Comment: marijuana, RX narcs  . Sexually Active: Yes   Other Topics Concern  . Not on file   Social History Narrative   Lives w/ mother.      Review of Systems: See HPI, otherwise negative ROS   Physical Exam: BP 131/93  Temp(Src) 97.6 F (36.4 C) (Oral)  Resp 21  SpO2 95% General:   Alert,  pleasant and cooperative in NAD Head:  Normocephalic and atraumatic. Neck:  Supple;  Lungs:  Clear throughout to auscultation.    Heart:  Regular rate and rhythm. Abdomen:  Soft, nontender and nondistended. Normal bowel sounds, without guarding, and without rebound.   Neurologic:  Alert and  oriented x4;  grossly normal neurologically.  Impression/Plan:     BRBPR/pud  PLAN: EGD/TCS TODAY

## 2012-12-15 NOTE — Anesthesia Postprocedure Evaluation (Signed)
  Anesthesia Post-op Note  Patient: Raymond Fox  Procedure(s) Performed: Procedure(s) with comments: COLONOSCOPY WITH PROPOFOL (N/A) - in cecum at 0751; end at 0800 ; total withdrawal time = 9 minutes ESOPHAGOGASTRODUODENOSCOPY (EGD) WITH PROPOFOL (N/A) - start at 0806  Patient Location: PACU  Anesthesia Type:MAC  Level of Consciousness: awake, alert  and oriented  Airway and Oxygen Therapy: Patient Spontanous Breathing  Post-op Pain: none  Post-op Assessment: Post-op Vital signs reviewed, Patient's Cardiovascular Status Stable, Respiratory Function Stable, Patent Airway, No signs of Nausea or vomiting, Adequate PO intake, Pain level controlled, No headache, No backache, No residual numbness and No residual motor weakness  Post-op Vital Signs: Reviewed and stable  Complications: No apparent anesthesia complications

## 2012-12-16 ENCOUNTER — Encounter (HOSPITAL_COMMUNITY): Payer: Self-pay | Admitting: Gastroenterology

## 2012-12-16 NOTE — Op Note (Signed)
[facility] [address] [place], [address]   COLONOSCOPY PROCEDURE REPORT  PATIENT: Raymond, Fox  MR#: [id number] BIRTHDATE: 12-Jul-1961 , 52  yrs. old GENDER: Male ENDOSCOPIST: Jonette Eva, MD REFERRED JX:[id number] Muse, PA PROCEDURE DATE:  12/15/2012 PROCEDURE:   Colonoscopy, screening INDICATIONS:Rectal Bleeding.  ADMITTED JAN 2014 FOR MELENA DUE TO PUD(GASTRIC/DUODENAL) MEDICATIONS: MAC sedation, administered by CRNA  DESCRIPTION OF PROCEDURE:    Physical exam was performed.  Informed consent was obtained from the patient after explaining the benefits, risks, and alternatives to procedure.  The patient was connected to monitor and placed in left lateral position. Continuous oxygen was provided by nasal cannula and IV medicine administered through an indwelling cannula.  After administration of sedation and rectal exam, the patients rectum was intubated and the     colonoscope was advanced under direct visualization to the cecum.  The scope was removed slowly by carefully examining the color, texture, anatomy, and integrity mucosa on the way out.  The patient was recovered in endoscopy and discharged home in satisfactory condition.    COLON FINDINGS: The colon was otherwise normal.  There was no diverticulosis, inflammation, polyps or cancers unless previously stated and Moderate sized internal hemorrhoids were found.  PREP QUALITY: good. CECAL W/D TIME: 9 minutes COMPLICATIONS: None  ENDOSCOPIC IMPRESSION: 1.   The colon IS normal 2.   Moderate sized internal hemorrhoids-DEFINITE CAUSE FOR RECTAL BLEEDING  RECOMMENDATIONS: AWAIT BIOPSY HIGH FIBER DIET TCS IN 10 YEARS WITH PROPOFOL/OVERTUBE       _______________________________ Rosalie DoctorJonette Eva, MD 12/15/2012 12:15 PM

## 2012-12-16 NOTE — Op Note (Signed)
[facility] [address] [place], [address]   ENDOSCOPY PROCEDURE REPORT  PATIENT: Raymond Fox, Raymond Fox  MR#: [id number] BIRTHDATE: 1961-05-29 , 52  yrs. old GENDER: Male  ENDOSCOPIST: Jonette Eva, MD REFERRED GE:[id number] Muse, PA  PROCEDURE DATE: 12/15/2012 PROCEDURE:   EGD, screening  INDICATIONS:GASTRIC/DUODENAL ULCERS ON EGD JAN 2014.  REPEAT EGD TO ASSESS HEALING.  PT ABSTAINING FROM ETOH FOR PAST 2 MOS.  NO ABD PAIN, OR MELENA.Marland Kitchen MEDICATIONS: MAC sedation, administered by CRNA TOPICAL ANESTHETIC:   Cetacaine Spray  DESCRIPTION OF PROCEDURE:     Physical exam was performed.  Informed consent was obtained from the patient after explaining the benefits, risks, and alternatives to the procedure.  The patient was connected to the monitor and placed in the left lateral position.  Continuous oxygen was provided by nasal cannula and IV medicine administered through an indwelling cannula.  After administration of sedation, the patients esophagus was intubated and the     endoscope was advanced under direct visualization to the second portion of the duodenum.  The scope was removed slowly by carefully examining the color, texture, anatomy, and integrity of the mucosa on the way out.  The patient was recovered in endoscopy and discharged home in satisfactory condition.   ESOPHAGUS: A mild Schatzki ring was found at the gastroesophageal junction.   The esophagus was otherwise normal.   STOMACH: Mild gastritis (inflammation) was found in the gastric antrum, gastric body, and on the greater curvature of the gastric body. DUODENUM: Mild duodenal inflammation was found.   The duodenal mucosa showed no abnormalities in the 2nd part of the duodenum.   COMPLICATIONS:   None  ENDOSCOPIC IMPRESSION: 1.   Schatzki ring 2.   MILD Gastritis (inflammation) 3.   MILD Duodenitis 4.   The duodenal mucosa showed no abnormalities in the 2nd part of the  duodenum  RECOMMENDATIONS: CONTINUE PROTONIX.  TAKE 30 MINUTES PRIOR TO MEALS TWICE DAILY. AVOID ITEMS THAT TRIGGER GASTRITIS. FOLLOW A LOW FAT/HIGH FIBER DIET.  AVOID ITEMS THAT CAUSE BLOATING.   FOLLOW UP IN 4 MOS. May reduce protonix TO QD after OPV JUN 2014   REPEAT EXAM:   _______________________________ Rosalie DoctorJonette Eva, MD 12/15/2012 12:23 PM       PATIENT NAME:  Raymond Fox, Raymond Fox MR#: [id number]

## 2013-03-25 NOTE — Progress Notes (Signed)
EGD FEB 2014 GASTRITIS/DUODENITIS TCS-IH NEXT TCS IN 10 YEARS WITH PROPOFOL/OVERTUBE FEB 2014 HB 13.8, PLT 424, NL HFP/CR  REVIEWED.   OPV IN AUG/SEP 2014 E15 ANEMIA. NEEDS REPEAT CBC.

## 2013-04-07 ENCOUNTER — Encounter: Payer: Self-pay | Admitting: Gastroenterology

## 2013-04-12 ENCOUNTER — Other Ambulatory Visit: Payer: Self-pay

## 2013-04-12 ENCOUNTER — Telehealth: Payer: Self-pay | Admitting: Gastroenterology

## 2013-04-12 DIAGNOSIS — D649 Anemia, unspecified: Secondary | ICD-10-CM

## 2013-04-12 NOTE — Telephone Encounter (Signed)
Pt has OV for 8/27 at 1030 with SF and needs a repeat CBC

## 2013-04-12 NOTE — Telephone Encounter (Signed)
Lab order on file for August.

## 2013-04-12 NOTE — Progress Notes (Signed)
Pt is aware of OV in August and appt card was mailed. Nurse was made aware of CBC to be done

## 2013-05-04 ENCOUNTER — Other Ambulatory Visit: Payer: Self-pay

## 2013-05-04 DIAGNOSIS — D649 Anemia, unspecified: Secondary | ICD-10-CM

## 2013-06-16 ENCOUNTER — Telehealth: Payer: Self-pay | Admitting: Gastroenterology

## 2013-06-16 ENCOUNTER — Ambulatory Visit: Payer: Medicare PPO | Admitting: Gastroenterology

## 2013-06-16 NOTE — Telephone Encounter (Signed)
Pt was a no show

## 2013-06-16 NOTE — Telephone Encounter (Signed)
REVIEWED.  

## 2016-02-04 DIAGNOSIS — X58XXXA Exposure to other specified factors, initial encounter: Secondary | ICD-10-CM | POA: Diagnosis not present

## 2016-02-04 DIAGNOSIS — I10 Essential (primary) hypertension: Secondary | ICD-10-CM | POA: Diagnosis not present

## 2016-02-04 DIAGNOSIS — F17200 Nicotine dependence, unspecified, uncomplicated: Secondary | ICD-10-CM | POA: Diagnosis not present

## 2016-02-04 DIAGNOSIS — S52201G Unspecified fracture of shaft of right ulna, subsequent encounter for closed fracture with delayed healing: Secondary | ICD-10-CM | POA: Diagnosis not present

## 2016-02-04 DIAGNOSIS — D509 Iron deficiency anemia, unspecified: Secondary | ICD-10-CM | POA: Diagnosis not present

## 2016-02-04 DIAGNOSIS — S52611D Displaced fracture of right ulna styloid process, subsequent encounter for closed fracture with routine healing: Secondary | ICD-10-CM | POA: Diagnosis not present

## 2016-02-04 DIAGNOSIS — R220 Localized swelling, mass and lump, head: Secondary | ICD-10-CM | POA: Diagnosis not present

## 2016-02-04 DIAGNOSIS — R05 Cough: Secondary | ICD-10-CM | POA: Diagnosis not present

## 2016-02-04 DIAGNOSIS — Z880 Allergy status to penicillin: Secondary | ICD-10-CM | POA: Diagnosis not present

## 2016-02-04 DIAGNOSIS — L568 Other specified acute skin changes due to ultraviolet radiation: Secondary | ICD-10-CM | POA: Diagnosis not present

## 2016-02-04 DIAGNOSIS — R51 Headache: Secondary | ICD-10-CM | POA: Diagnosis not present

## 2016-02-04 DIAGNOSIS — J449 Chronic obstructive pulmonary disease, unspecified: Secondary | ICD-10-CM | POA: Diagnosis not present

## 2016-02-04 DIAGNOSIS — R600 Localized edema: Secondary | ICD-10-CM | POA: Diagnosis not present

## 2016-02-04 DIAGNOSIS — H05223 Edema of bilateral orbit: Secondary | ICD-10-CM | POA: Diagnosis not present

## 2016-02-04 DIAGNOSIS — S52501D Unspecified fracture of the lower end of right radius, subsequent encounter for closed fracture with routine healing: Secondary | ICD-10-CM | POA: Diagnosis not present

## 2016-02-04 DIAGNOSIS — F10229 Alcohol dependence with intoxication, unspecified: Secondary | ICD-10-CM | POA: Diagnosis not present

## 2016-02-04 DIAGNOSIS — E860 Dehydration: Secondary | ICD-10-CM | POA: Diagnosis not present

## 2016-02-05 DIAGNOSIS — F1010 Alcohol abuse, uncomplicated: Secondary | ICD-10-CM | POA: Diagnosis not present

## 2016-02-05 DIAGNOSIS — I10 Essential (primary) hypertension: Secondary | ICD-10-CM | POA: Diagnosis not present

## 2016-02-14 DIAGNOSIS — Z125 Encounter for screening for malignant neoplasm of prostate: Secondary | ICD-10-CM | POA: Diagnosis not present

## 2016-02-14 DIAGNOSIS — Z131 Encounter for screening for diabetes mellitus: Secondary | ICD-10-CM | POA: Diagnosis not present

## 2016-02-14 DIAGNOSIS — R5383 Other fatigue: Secondary | ICD-10-CM | POA: Diagnosis not present

## 2016-02-14 DIAGNOSIS — D649 Anemia, unspecified: Secondary | ICD-10-CM | POA: Diagnosis not present

## 2016-02-14 DIAGNOSIS — Z13220 Encounter for screening for lipoid disorders: Secondary | ICD-10-CM | POA: Diagnosis not present

## 2016-02-14 DIAGNOSIS — R0600 Dyspnea, unspecified: Secondary | ICD-10-CM | POA: Diagnosis not present

## 2016-02-14 DIAGNOSIS — F1010 Alcohol abuse, uncomplicated: Secondary | ICD-10-CM | POA: Diagnosis not present

## 2016-02-14 DIAGNOSIS — I10 Essential (primary) hypertension: Secondary | ICD-10-CM | POA: Diagnosis not present

## 2016-02-14 DIAGNOSIS — F17200 Nicotine dependence, unspecified, uncomplicated: Secondary | ICD-10-CM | POA: Diagnosis not present

## 2016-02-14 DIAGNOSIS — E559 Vitamin D deficiency, unspecified: Secondary | ICD-10-CM | POA: Diagnosis not present

## 2016-02-28 DIAGNOSIS — R0600 Dyspnea, unspecified: Secondary | ICD-10-CM | POA: Diagnosis not present

## 2016-04-03 DIAGNOSIS — D649 Anemia, unspecified: Secondary | ICD-10-CM | POA: Diagnosis not present

## 2016-04-03 DIAGNOSIS — E559 Vitamin D deficiency, unspecified: Secondary | ICD-10-CM | POA: Diagnosis not present

## 2016-04-03 DIAGNOSIS — Z23 Encounter for immunization: Secondary | ICD-10-CM | POA: Diagnosis not present

## 2016-04-03 DIAGNOSIS — I10 Essential (primary) hypertension: Secondary | ICD-10-CM | POA: Diagnosis not present

## 2016-04-03 DIAGNOSIS — F1010 Alcohol abuse, uncomplicated: Secondary | ICD-10-CM | POA: Diagnosis not present

## 2016-04-08 ENCOUNTER — Encounter: Payer: Self-pay | Admitting: Gastroenterology

## 2016-05-16 ENCOUNTER — Encounter: Payer: Self-pay | Admitting: Gastroenterology

## 2016-05-16 ENCOUNTER — Ambulatory Visit: Payer: Medicare PPO | Admitting: Gastroenterology

## 2016-05-16 ENCOUNTER — Telehealth: Payer: Self-pay | Admitting: Gastroenterology

## 2016-05-16 NOTE — Telephone Encounter (Signed)
Letter mailed

## 2016-05-16 NOTE — Telephone Encounter (Signed)
Pt was a no show

## 2016-11-12 ENCOUNTER — Emergency Department (HOSPITAL_COMMUNITY): Payer: Medicare HMO

## 2016-11-12 ENCOUNTER — Encounter (HOSPITAL_COMMUNITY): Payer: Self-pay

## 2016-11-12 ENCOUNTER — Inpatient Hospital Stay (HOSPITAL_COMMUNITY)
Admission: EM | Admit: 2016-11-12 | Discharge: 2016-11-21 | DRG: 378 | Disposition: A | Discharge status: Skilled Nursing Facility | Payer: Medicare HMO | Attending: Internal Medicine | Admitting: Internal Medicine

## 2016-11-12 DIAGNOSIS — K254 Chronic or unspecified gastric ulcer with hemorrhage: Secondary | ICD-10-CM | POA: Diagnosis present

## 2016-11-12 DIAGNOSIS — D62 Acute posthemorrhagic anemia: Secondary | ICD-10-CM | POA: Diagnosis not present

## 2016-11-12 DIAGNOSIS — Z8701 Personal history of pneumonia (recurrent): Secondary | ICD-10-CM | POA: Diagnosis not present

## 2016-11-12 DIAGNOSIS — K922 Gastrointestinal hemorrhage, unspecified: Secondary | ICD-10-CM | POA: Diagnosis not present

## 2016-11-12 DIAGNOSIS — K3189 Other diseases of stomach and duodenum: Secondary | ICD-10-CM | POA: Diagnosis not present

## 2016-11-12 DIAGNOSIS — R0602 Shortness of breath: Secondary | ICD-10-CM | POA: Diagnosis not present

## 2016-11-12 DIAGNOSIS — Z96641 Presence of right artificial hip joint: Secondary | ICD-10-CM | POA: Diagnosis present

## 2016-11-12 DIAGNOSIS — F1010 Alcohol abuse, uncomplicated: Secondary | ICD-10-CM | POA: Diagnosis not present

## 2016-11-12 DIAGNOSIS — F17210 Nicotine dependence, cigarettes, uncomplicated: Secondary | ICD-10-CM | POA: Diagnosis not present

## 2016-11-12 DIAGNOSIS — K2980 Duodenitis without bleeding: Secondary | ICD-10-CM | POA: Diagnosis present

## 2016-11-12 DIAGNOSIS — K2970 Gastritis, unspecified, without bleeding: Secondary | ICD-10-CM | POA: Diagnosis present

## 2016-11-12 DIAGNOSIS — F10231 Alcohol dependence with withdrawal delirium: Secondary | ICD-10-CM | POA: Diagnosis present

## 2016-11-12 DIAGNOSIS — K264 Chronic or unspecified duodenal ulcer with hemorrhage: Secondary | ICD-10-CM | POA: Diagnosis not present

## 2016-11-12 DIAGNOSIS — J45909 Unspecified asthma, uncomplicated: Secondary | ICD-10-CM | POA: Diagnosis present

## 2016-11-12 DIAGNOSIS — Z8711 Personal history of peptic ulcer disease: Secondary | ICD-10-CM

## 2016-11-12 DIAGNOSIS — K920 Hematemesis: Secondary | ICD-10-CM | POA: Diagnosis not present

## 2016-11-12 DIAGNOSIS — D509 Iron deficiency anemia, unspecified: Secondary | ICD-10-CM | POA: Diagnosis present

## 2016-11-12 DIAGNOSIS — R278 Other lack of coordination: Secondary | ICD-10-CM | POA: Diagnosis not present

## 2016-11-12 DIAGNOSIS — J9811 Atelectasis: Secondary | ICD-10-CM | POA: Diagnosis not present

## 2016-11-12 DIAGNOSIS — J479 Bronchiectasis, uncomplicated: Secondary | ICD-10-CM | POA: Diagnosis present

## 2016-11-12 DIAGNOSIS — F1910 Other psychoactive substance abuse, uncomplicated: Secondary | ICD-10-CM | POA: Diagnosis not present

## 2016-11-12 DIAGNOSIS — D72828 Other elevated white blood cell count: Secondary | ICD-10-CM | POA: Diagnosis not present

## 2016-11-12 DIAGNOSIS — K269 Duodenal ulcer, unspecified as acute or chronic, without hemorrhage or perforation: Secondary | ICD-10-CM | POA: Diagnosis present

## 2016-11-12 DIAGNOSIS — F10239 Alcohol dependence with withdrawal, unspecified: Secondary | ICD-10-CM | POA: Diagnosis not present

## 2016-11-12 DIAGNOSIS — R41841 Cognitive communication deficit: Secondary | ICD-10-CM | POA: Diagnosis not present

## 2016-11-12 DIAGNOSIS — K259 Gastric ulcer, unspecified as acute or chronic, without hemorrhage or perforation: Secondary | ICD-10-CM | POA: Diagnosis present

## 2016-11-12 DIAGNOSIS — I7389 Other specified peripheral vascular diseases: Secondary | ICD-10-CM | POA: Diagnosis not present

## 2016-11-12 DIAGNOSIS — M6281 Muscle weakness (generalized): Secondary | ICD-10-CM

## 2016-11-12 DIAGNOSIS — R109 Unspecified abdominal pain: Secondary | ICD-10-CM | POA: Diagnosis not present

## 2016-11-12 DIAGNOSIS — Z9081 Acquired absence of spleen: Secondary | ICD-10-CM

## 2016-11-12 DIAGNOSIS — K449 Diaphragmatic hernia without obstruction or gangrene: Secondary | ICD-10-CM | POA: Diagnosis present

## 2016-11-12 DIAGNOSIS — F10939 Alcohol use, unspecified with withdrawal, unspecified: Secondary | ICD-10-CM | POA: Diagnosis present

## 2016-11-12 DIAGNOSIS — I10 Essential (primary) hypertension: Secondary | ICD-10-CM | POA: Diagnosis not present

## 2016-11-12 DIAGNOSIS — K921 Melena: Secondary | ICD-10-CM | POA: Diagnosis not present

## 2016-11-12 DIAGNOSIS — K250 Acute gastric ulcer with hemorrhage: Secondary | ICD-10-CM | POA: Diagnosis not present

## 2016-11-12 DIAGNOSIS — Z880 Allergy status to penicillin: Secondary | ICD-10-CM | POA: Diagnosis not present

## 2016-11-12 DIAGNOSIS — Z720 Tobacco use: Secondary | ICD-10-CM | POA: Diagnosis not present

## 2016-11-12 DIAGNOSIS — R41840 Attention and concentration deficit: Secondary | ICD-10-CM | POA: Diagnosis not present

## 2016-11-12 DIAGNOSIS — Z801 Family history of malignant neoplasm of trachea, bronchus and lung: Secondary | ICD-10-CM | POA: Diagnosis not present

## 2016-11-12 DIAGNOSIS — G4089 Other seizures: Secondary | ICD-10-CM | POA: Diagnosis not present

## 2016-11-12 DIAGNOSIS — Y900 Blood alcohol level of less than 20 mg/100 ml: Secondary | ICD-10-CM | POA: Diagnosis not present

## 2016-11-12 DIAGNOSIS — F10230 Alcohol dependence with withdrawal, uncomplicated: Secondary | ICD-10-CM | POA: Diagnosis not present

## 2016-11-12 DIAGNOSIS — R918 Other nonspecific abnormal finding of lung field: Secondary | ICD-10-CM | POA: Diagnosis not present

## 2016-11-12 HISTORY — DX: Other specified abnormal findings of blood chemistry: R79.89

## 2016-11-12 HISTORY — DX: Gastroduodenitis, unspecified, without bleeding: K29.90

## 2016-11-12 HISTORY — DX: Abnormal results of liver function studies: R94.5

## 2016-11-12 LAB — COMPREHENSIVE METABOLIC PANEL
ALBUMIN: 3.3 g/dL — AB (ref 3.5–5.0)
ALT: 9 U/L — ABNORMAL LOW (ref 17–63)
AST: 16 U/L (ref 15–41)
Alkaline Phosphatase: 80 U/L (ref 38–126)
Anion gap: 10 (ref 5–15)
BILIRUBIN TOTAL: 0.4 mg/dL (ref 0.3–1.2)
BUN: 41 mg/dL — AB (ref 6–20)
CO2: 24 mmol/L (ref 22–32)
Calcium: 9.1 mg/dL (ref 8.9–10.3)
Chloride: 97 mmol/L — ABNORMAL LOW (ref 101–111)
Creatinine, Ser: 0.99 mg/dL (ref 0.61–1.24)
GFR calc Af Amer: >60 mL/min (ref >60)
GFR calc non Af Amer: >60 mL/min (ref >60)
GLUCOSE: 116 mg/dL — AB (ref 65–99)
POTASSIUM: 4.3 mmol/L (ref 3.5–5.1)
Sodium: 131 mmol/L — ABNORMAL LOW (ref 135–145)
TOTAL PROTEIN: 7.8 g/dL (ref 6.5–8.1)

## 2016-11-12 LAB — CBC
HEMATOCRIT: 30 % — AB (ref 39.0–52.0)
Hemoglobin: 9.7 g/dL — ABNORMAL LOW (ref 13.0–17.0)
MCH: 25.8 pg — ABNORMAL LOW (ref 26.0–34.0)
MCHC: 32.3 g/dL (ref 30.0–36.0)
MCV: 79.8 fL (ref 78.0–100.0)
Platelets: 511 10*3/uL — ABNORMAL HIGH (ref 150–400)
RBC: 3.76 MIL/uL — ABNORMAL LOW (ref 4.22–5.81)
RDW: 18.3 % — AB (ref 11.5–15.5)
WBC: 21.8 10*3/uL — ABNORMAL HIGH (ref 4.0–10.5)

## 2016-11-12 LAB — RETICULOCYTES
RBC.: 3.74 MIL/uL — AB (ref 4.22–5.81)
RETIC COUNT ABSOLUTE: 63.6 10*3/uL (ref 19.0–186.0)
Retic Ct Pct: 1.7 % (ref 0.4–3.1)

## 2016-11-12 LAB — LACTIC ACID, PLASMA
LACTIC ACID, VENOUS: 1.5 mmol/L (ref 0.5–1.9)
Lactic Acid, Venous: 1.6 mmol/L (ref 0.5–1.9)

## 2016-11-12 LAB — TROPONIN I

## 2016-11-12 LAB — LIPASE, BLOOD: LIPASE: 16 U/L (ref 11–51)

## 2016-11-12 LAB — ETHANOL: Alcohol, Ethyl (B): <5 mg/dL (ref <5)

## 2016-11-12 LAB — PROTIME-INR
INR: 0.97
Prothrombin Time: 12.9 seconds (ref 11.4–15.2)

## 2016-11-12 LAB — RAPID URINE DRUG SCREEN, HOSP PERFORMED
AMPHETAMINES: NOT DETECTED
Barbiturates: NOT DETECTED
Benzodiazepines: POSITIVE — AB
Cocaine: NOT DETECTED
OPIATES: POSITIVE — AB
TETRAHYDROCANNABINOL: NOT DETECTED

## 2016-11-12 LAB — MRSA PCR SCREENING: MRSA by PCR: NEGATIVE

## 2016-11-12 LAB — HEMOGLOBIN: Hemoglobin: 7.8 g/dL — ABNORMAL LOW (ref 13.0–17.0)

## 2016-11-12 LAB — POC OCCULT BLOOD, ED: Fecal Occult Bld: POSITIVE — AB

## 2016-11-12 MED ORDER — LORAZEPAM 2 MG/ML IJ SOLN
0.0000 mg | Freq: Four times a day (QID) | INTRAMUSCULAR | Status: DC
  Administered 2016-11-12 – 2016-11-14 (×7): 2 mg via INTRAVENOUS
  Filled 2016-11-12 (×7): qty 1

## 2016-11-12 MED ORDER — LORAZEPAM 1 MG PO TABS: 1.0000 mg | ORAL_TABLET | Freq: Four times a day (QID) | ORAL | Status: AC | PRN

## 2016-11-12 MED ORDER — ONDANSETRON HCL 4 MG PO TABS: 4.0000 mg | ORAL_TABLET | Freq: Four times a day (QID) | ORAL | Status: DC | PRN

## 2016-11-12 MED ORDER — SODIUM CHLORIDE 0.9 % IV SOLN
Freq: Once | INTRAVENOUS | Status: AC
  Administered 2016-11-13: 01:00:00 via INTRAVENOUS

## 2016-11-12 MED ORDER — SODIUM CHLORIDE 0.9 % IV SOLN
INTRAVENOUS | Status: DC
  Administered 2016-11-12: 20:00:00 via INTRAVENOUS

## 2016-11-12 MED ORDER — ADULT MULTIVITAMIN W/MINERALS CH
1.0000 | ORAL_TABLET | Freq: Every day | ORAL | Status: DC
  Administered 2016-11-12 – 2016-11-21 (×10): 1 via ORAL
  Filled 2016-11-12 (×10): qty 1

## 2016-11-12 MED ORDER — LORAZEPAM 2 MG/ML IJ SOLN
1.0000 mg | Freq: Four times a day (QID) | INTRAMUSCULAR | Status: AC | PRN
  Administered 2016-11-13 – 2016-11-15 (×8): 1 mg via INTRAVENOUS
  Filled 2016-11-12 (×8): qty 1

## 2016-11-12 MED ORDER — PIPERACILLIN-TAZOBACTAM 3.375 G IVPB
3.3750 g | Freq: Three times a day (TID) | INTRAVENOUS | Status: DC
  Administered 2016-11-12 – 2016-11-14 (×5): 3.375 g via INTRAVENOUS
  Filled 2016-11-12 (×5): qty 50

## 2016-11-12 MED ORDER — SODIUM CHLORIDE 0.9 % IV SOLN
80.0000 mg | Freq: Once | INTRAVENOUS | Status: AC
  Administered 2016-11-12: 18:00:00 80 mg via INTRAVENOUS
  Filled 2016-11-12: qty 80

## 2016-11-12 MED ORDER — SODIUM CHLORIDE 0.9 % IV SOLN
8.0000 mg/h | INTRAVENOUS | Status: AC
  Administered 2016-11-12 – 2016-11-15 (×8): 8 mg/h via INTRAVENOUS
  Filled 2016-11-12 (×8): qty 80

## 2016-11-12 MED ORDER — INFLUENZA VAC SPLIT QUAD 0.5 ML IM SUSY: 0.5000 mL | PREFILLED_SYRINGE | INTRAMUSCULAR | Status: DC

## 2016-11-12 MED ORDER — IOPAMIDOL (ISOVUE-300) INJECTION 61%
100.0000 mL | Freq: Once | INTRAVENOUS | Status: AC | PRN
  Administered 2016-11-12: 100 mL via INTRAVENOUS

## 2016-11-12 MED ORDER — IOPAMIDOL (ISOVUE-300) INJECTION 61%: 30.0000 mL | Freq: Once | INTRAVENOUS | Status: DC | PRN

## 2016-11-12 MED ORDER — SODIUM CHLORIDE 0.9 % IV SOLN
INTRAVENOUS | Status: DC
  Administered 2016-11-13 (×2): via INTRAVENOUS

## 2016-11-12 MED ORDER — SODIUM CHLORIDE 0.9 % IV BOLUS (SEPSIS)
1000.0000 mL | Freq: Once | INTRAVENOUS | Status: AC
  Administered 2016-11-12: 1000 mL via INTRAVENOUS

## 2016-11-12 MED ORDER — THIAMINE HCL 100 MG/ML IJ SOLN
100.0000 mg | Freq: Every day | INTRAMUSCULAR | Status: DC
  Administered 2016-11-14: 100 mg via INTRAVENOUS
  Filled 2016-11-12 (×2): qty 2

## 2016-11-12 MED ORDER — LORAZEPAM 2 MG/ML IJ SOLN
1.0000 mg | Freq: Once | INTRAMUSCULAR | Status: AC
  Administered 2016-11-12: 1 mg via INTRAVENOUS
  Filled 2016-11-12: qty 1

## 2016-11-12 MED ORDER — ONDANSETRON HCL 4 MG/2ML IJ SOLN: 4.0000 mg | Freq: Four times a day (QID) | INTRAMUSCULAR | Status: DC | PRN

## 2016-11-12 MED ORDER — VITAMIN B-1 100 MG PO TABS
100.0000 mg | ORAL_TABLET | Freq: Every day | ORAL | Status: DC
  Administered 2016-11-12 – 2016-11-21 (×9): 100 mg via ORAL
  Filled 2016-11-12 (×9): qty 1

## 2016-11-12 MED ORDER — FOLIC ACID 1 MG PO TABS
1.0000 mg | ORAL_TABLET | Freq: Every day | ORAL | Status: DC
  Administered 2016-11-12 – 2016-11-21 (×10): 1 mg via ORAL
  Filled 2016-11-12 (×10): qty 1

## 2016-11-12 MED ORDER — SODIUM CHLORIDE 0.9 % IV SOLN
INTRAVENOUS | Status: AC
  Administered 2016-11-12: 21:00:00 via INTRAVENOUS

## 2016-11-12 MED ORDER — LORAZEPAM 2 MG/ML IJ SOLN
0.0000 mg | Freq: Four times a day (QID) | INTRAMUSCULAR | Status: DC
  Administered 2016-11-12: 1 mg via INTRAVENOUS
  Filled 2016-11-12: qty 1

## 2016-11-12 MED ORDER — SODIUM CHLORIDE 0.9 % IV SOLN
50.0000 ug/h | INTRAVENOUS | Status: DC
  Administered 2016-11-12 – 2016-11-13 (×3): 50 ug/h via INTRAVENOUS
  Filled 2016-11-12 (×5): qty 1

## 2016-11-12 MED ORDER — PANTOPRAZOLE SODIUM 40 MG IV SOLR: 40.0000 mg | Freq: Two times a day (BID) | INTRAVENOUS | Status: DC

## 2016-11-12 MED ORDER — LORAZEPAM 2 MG/ML IJ SOLN: 0.0000 mg | Freq: Two times a day (BID) | INTRAMUSCULAR | Status: DC

## 2016-11-12 MED ORDER — THIAMINE HCL 100 MG/ML IJ SOLN
100.0000 mg | Freq: Every day | INTRAMUSCULAR | Status: DC
  Administered 2016-11-12: 100 mg via INTRAVENOUS
  Filled 2016-11-12: qty 2

## 2016-11-12 MED ORDER — OCTREOTIDE ACETATE 500 MCG/ML IJ SOLN
INTRAMUSCULAR | Status: AC
  Filled 2016-11-12: qty 1

## 2016-11-12 MED ORDER — VITAMIN B-1 100 MG PO TABS: 100.0000 mg | ORAL_TABLET | Freq: Every day | ORAL | Status: DC

## 2016-11-12 NOTE — ED Notes (Signed)
Pt's mom states that he gets alcohol withdrawal and seizes and she states that he has not been drinking.  Ethyl alcohol level <5.  Notified MD.

## 2016-11-12 NOTE — ED Triage Notes (Signed)
Pt reports that he started passing bright red in stool today at 12. . Complaining of abdominal pain for several days , worsening today. Has hx of bleeding ulcer

## 2016-11-12 NOTE — Progress Notes (Signed)
eLink Physician-Brief Progress Note Patient Name: Raymond LainWilliam R Fox DOB: [birth year] MRN: [id number]   Date of Service  11/12/2016  HPI/Events of Note  ETOH abuse with possible GI bleed.  Confused, no distress, awake, on [place].   eICU Interventions  No interventions at this time.         Shane Crutchradeep Dasani Thurlow 11/12/2016, 9:56 PM

## 2016-11-12 NOTE — Progress Notes (Signed)
Pharmacy Antibiotic Note  Raymond Fox is a 56 y.o. male admitted on 11/12/2016 with intra abdominal infection.  Pharmacy has been consulted for zosyn dosing.  Plan: Zosyn 3.375g IV q8h (4 hour infusion).  Height: 5\' 6"  (167.6 cm) Weight: 117 lb 1 oz (53.1 kg) IBW/kg (Calculated) : 63.8  Temp (24hrs), Avg:98.1 F (36.7 C), Min:97.6 F (36.4 C), Max:98.6 F (37 C)   Recent Labs Lab 11/12/16 1556 11/12/16 1629 11/12/16 1931  WBC 21.8*  --   --   CREATININE 0.99  --   --   LATICACIDVEN  --  1.6 1.5    Estimated Creatinine Clearance: 61.8 mL/min (by C-G formula based on SCr of 0.99 mg/dL).    Allergies  Allergen Reactions  . Penicillins Rash    Has patient had a PCN reaction causing immediate rash, facial/tongue/throat swelling, SOB or lightheadedness with hypotension: Yes Has patient had a PCN reaction causing severe rash involving mucus membranes or skin necrosis: No Has patient had a PCN reaction that required hospitalization No Has patient had a PCN reaction occurring within the last 10 years: No If all of the above answers are "NO", then may proceed with Cephalosporin use. 1    Antimicrobials this admission: zosyn 1/23 >>     Thank you for allowing pharmacy to be a part of this patient's care.  Raymond Fox Raymond Fox, Raymond Fox Raymond Fox 11/12/2016 9:51 PM

## 2016-11-12 NOTE — H&P (Signed)
History and Physical    Raymond Fox VQQ:[id number] DOB: [birth year] DOA: 11/12/2016  PCP: Tylene Fantasia., PA-C  Patient coming from: home  Chief Complaint:   bleeding  HPI: Raymond Fox is a 56 y.o. male with medical history significant of etoh abuse, gastric ulcer, duodenal ulcer, asthma, polysubstance abuse comes in with 2 days of epigastric abdominal pain and what started off as melana, then progressed to [facility] colored stool, then he started getting nauseated and vomiting dark reddish vomit today.  He denies any fevers.  His last drink was yesterday.  Pt is very nauseated and is coughing a lot.  He has a h/o seizures before I presume from etoh withdrawal.  Pt referred for admission for gib.  Pt had egd in 2014 when he had a bleeding ulcer, at that time had no varices.  Dr fields called with GI and is aware of pt, advised starting an octreotide drip in addition to protonix drip.   Review of Systems: As per HPI otherwise 10 point review of systems negative.   Past Medical History:  Diagnosis Date  . Alcoholic encephalopathy (HCC) 4/33/2951  . Anemia   . Anemia associated with acute blood loss 11/01/2012  . Asthma   . Bleeding duodenal ulcer 11/01/2012  . Elevated LFTs   . ETOH abuse   . Gastric ulcer 11/01/2012  . Gastritis and duodenitis   . Helicobacter pylori antibody positive 11/03/2012   But biopsy negative for H. pylori.  Marland Kitchen Hypertension   . Pneumonia 2010  . Polysubstance abuse    Rx narcotics, marijuana, etoh  . Rectal bleeding   . Shortness of breath     Past Surgical History:  Procedure Laterality Date  . BACK SURGERY    . BIOPSY  10/30/2012   Negative for HPylori x2  . COLON SURGERY     ?pt cannot remember  . COLONOSCOPY WITH PROPOFOL N/A 12/15/2012   Procedure: COLONOSCOPY WITH PROPOFOL;  Surgeon: West Bali, MD;  Location: [facility];  Service: Endoscopy;  Laterality: N/A;  in cecum at 0751; end at 0800 ; total withdrawal time = 9 minutes  .  ESOPHAGOGASTRODUODENOSCOPY (EGD) WITH PROPOFOL  10/30/2012   Fields-multiple PUD s/p 2 hemoclips, hiatal hernia  . ESOPHAGOGASTRODUODENOSCOPY (EGD) WITH PROPOFOL N/A 12/15/2012   Procedure: ESOPHAGOGASTRODUODENOSCOPY (EGD) WITH PROPOFOL;  Surgeon: West Bali, MD;  Location: [facility];  Service: Endoscopy;  Laterality: N/A;  start at 0806  . JOINT REPLACEMENT    . SPLENECTOMY    . TOTAL HIP ARTHROPLASTY  ?2013     reports that he has been smoking Cigarettes.  He has a 30.00 pack-year smoking history. He has never used smokeless tobacco. He reports that he drinks alcohol. He reports that he uses drugs, including Marijuana.  Allergies  Allergen Reactions  . Penicillins Rash    Has patient had a PCN reaction causing immediate rash, facial/tongue/throat swelling, SOB or lightheadedness with hypotension: Yes Has patient had a PCN reaction causing severe rash involving mucus membranes or skin necrosis: No Has patient had a PCN reaction that required hospitalization No Has patient had a PCN reaction occurring within the last 10 years: No If all of the above answers are "NO", then may proceed with Cephalosporin use. 1    Family History  Problem Relation Age of Onset  . Lung cancer Father   . Cirrhosis Brother     ETOH  . Drug abuse Sister   . Seizures Brother     Prior  to Admission medications   Medication Sig Start Date End Date Taking? Authorizing Provider  acetaminophen (TYLENOL) 500 MG tablet Take 500 mg by mouth every 6 (six) hours as needed.   Yes Historical Provider, MD  aspirin-acetaminophen-caffeine (EXCEDRIN EXTRA STRENGTH) [phone] MG tablet Take 2 tablets by mouth every 6 (six) hours as needed for headache.   Yes Historical Provider, MD    Physical Exam: Vitals:   11/12/16 1715 11/12/16 1730 11/12/16 1750 11/12/16 1800  BP:  (!) 164/106 (!) 153/101 152/95  Pulse: (!) 127 (!) 134 117 112  Resp:      Temp:      SpO2: 92% 93%  96%  Weight:      Height:         Constitutional: NAD,  Asterixis present, mentating normally Vitals:   11/12/16 1715 11/12/16 1730 11/12/16 1750 11/12/16 1800  BP:  (!) 164/106 (!) 153/101 152/95  Pulse: (!) 127 (!) 134 117 112  Resp:      Temp:      SpO2: 92% 93%  96%  Weight:      Height:       Eyes: PERRL, lids and conjunctivae normal ENMT: Mucous membranes are moist. Posterior pharynx clear of any exudate or lesions.Normal dentition.  Neck: normal, supple, no masses, no thyromegaly Respiratory: clear to auscultation bilaterally, no wheezing, no crackles. Normal respiratory effort. No accessory muscle use.  Cardiovascular: Regular rate and rhythm, no murmurs / rubs / gallops. No extremity edema. 2+ pedal pulses. No carotid bruits.  Abdomen: epigastric tenderness, no masses palpated. No hepatosplenomegaly. Bowel sounds positive.  Musculoskeletal: no clubbing / cyanosis. No joint deformity upper and lower extremities. Good ROM, no contractures. Normal muscle tone.  Skin: no rashes, lesions, ulcers. No induration Neurologic: CN 2-12 grossly intact. Sensation intact, DTR normal. Strength 5/5 in all 4.  Psychiatric: Normal judgment and insight. Alert and oriented x 3. Normal mood.    Labs on Admission: I have personally reviewed following labs and imaging studies  CBC:  Recent Labs Lab 11/12/16 1556  WBC 21.8*  HGB 9.7*  HCT 30.0*  MCV 79.8  PLT 511*   Basic Metabolic Panel:  Recent Labs Lab 11/12/16 1556  NA 131*  K 4.3  CL 97*  CO2 24  GLUCOSE 116*  BUN 41*  CREATININE 0.99  CALCIUM 9.1   GFR: Estimated Creatinine Clearance: 73.9 mL/min (by C-G formula based on SCr of 0.99 mg/dL). Liver Function Tests:  Recent Labs Lab 11/12/16 1556  AST 16  ALT 9*  ALKPHOS 80  BILITOT 0.4  PROT 7.8  ALBUMIN 3.3*    Recent Labs Lab 11/12/16 1629  LIPASE 16    Cardiac Enzymes:  Recent Labs Lab 11/12/16 1629  TROPONINI <0.03     Radiological Exams on Admission: Dg Chest 2  View  Result Date: 11/12/2016 CLINICAL DATA:  Bright red blood in stool.  Abdominal pain. EXAM: CHEST  2 VIEW COMPARISON:  02/04/2016 FINDINGS: Multiple old left rib fractures. New densities at the left costophrenic angle. Evidence for lung hyperinflation. Heart and mediastinum are within normal limits. Again noted is a compression fracture in the mid thoracic spine. No large pleural effusions. IMPRESSION: Chronic hyperinflation with new left basilar densities. Findings could represent atelectasis. Electronically Signed   By: Richarda Overlie M.D.   On: 11/12/2016 18:56   Ct Abdomen Pelvis W Contrast  Result Date: 11/12/2016 CLINICAL DATA:  56 year old male with history of hematochezia at noon today. Abdominal pain for the past several  days, worsening today. History of bleeding ulcer. EXAM: CT ABDOMEN AND PELVIS WITH CONTRAST TECHNIQUE: Multidetector CT imaging of the abdomen and pelvis was performed using the standard protocol following bolus administration of intravenous contrast. CONTRAST:  ISOVUE-300 IOPAMIDOL (ISOVUE-300) INJECTION 61% COMPARISON:  CT the abdomen and pelvis 10/29/2012. FINDINGS: Lower chest: Scattered areas of mild cylindrical bronchiectasis are noted in the visualize lung bases, similar to prior studies. Hepatobiliary: No cystic or solid hepatic lesions. No intra or extrahepatic biliary ductal dilatation. Gallbladder is unremarkable in appearance. Pancreas: No pancreatic mass. No pancreatic ductal dilatation. No pancreatic or peripancreatic fluid or inflammatory changes. Spleen: Status post splenectomy, with a large splenule immediately medial to the left kidney, similar to the prior study. Adrenals/Urinary Tract: Bilateral kidneys and bilateral adrenal glands are normal in appearance. No hydroureteronephrosis. Urinary bladder is unremarkable in appearance (partially obscured by beam hardening artifact from the patient's right hip arthroplasty). Stomach/Bowel: The appearance of the  stomach is normal. There is no pathologic dilatation of small bowel or colon. The appendix is not confidently identified and may be surgically absent. Regardless, there are no inflammatory changes noted adjacent to the cecum to suggest the presence of an acute appendicitis at this time. Vascular/Lymphatic: Aortic atherosclerosis, without evidence of aneurysm or dissection in the abdominal or pelvic vasculature. No lymphadenopathy noted in the abdomen or pelvis. Reproductive: Prostate gland seminal vesicles are unremarkable in appearance. Other: No significant volume of ascites.  No pneumoperitoneum. Musculoskeletal: Status post PLIF at L5-S1 with interbody graft at L5-S1 interspace. There are no aggressive appearing lytic or blastic lesions noted in the visualized portions of the skeleton. Status post right hip arthroplasty. IMPRESSION: 1. No acute findings in the abdomen or pelvis to account for the patient's symptoms. 2. Scattered areas of cylindrical bronchiectasis are again noted in the lung bases bilaterally, chronic and similar to prior examinations. 3. Aortic atherosclerosis. 4. Additional incidental findings, as above. Electronically Signed   By: Trudie Reed M.D.   On: 11/12/2016 18:41    EKG: Independently reviewed. Sinus tachycardia cxr reviewed no edema or infiltrate appreciated Old records reviewed   Assessment/Plan 56 yo male with h/o etoh abuse comes in with 2 days of abdominal pain with associated melena/vomiting blood concerning for ugib  Principal Problem:   GI bleeding- pt has h/o ulcers.  Unknown what his varices status is, none per EGD but in 2014.  Place on both protonix drip and octreotide drip.  Think there is a component of withdrawal here, hgb is 9.  Consult GI for egd in the am.  Keep npo.  Prn zofran and pain meds ordered.  Repeat hgb at 11pm tonight, if any further bleeding will transfuse if h/h drops below 8.    Active Problems:   History of splenectomy- noted, due to  significant leukocytosis will cover with iv zosyn.   Alcohol abuse- noted, placed on CIWA protocol.  Given ativan now.     h/o Bleeding duodenal ulcer- noted, on protonix   h/o Gastric ulcer- noted, on protonic   Alcohol withdrawal syndrome (HCC)- CIWA, ativan ordered, give dose now   Anemia associated with acute blood loss- repeat h/h at 11pm tonight.  Transfuse if becomes unstable or hgb drops below 8  Prior to obtaining history from pt, I asked girlfriend to give [place] privacy and to leave the room after introducing myself as his physician.  Apparently she became very upset about this, after explaining this was to protect his privacy and it was nothing personal  against her she understood but was still offended.  I apologized and explained we would give her the same courtesy and privacy if she were the patient.  ICU RN present during entire conversation.    DVT prophylaxis: scds Code Status:  full Family Communication: none  Disposition Plan:  Per day team Consults called:  Dr. Darrick PennaFields with GI Admission status:   admission   Naquan Garman A MD [facility]  If 7PM-7AM, please contact night-coverage [email or web address] Password [email or web address]  11/12/2016, 7:57 PM

## 2016-11-12 NOTE — ED Provider Notes (Signed)
[facility] Provider Note   CSN: [id number] Arrival date & time: 11/12/16  1437     History   Chief Complaint Chief Complaint  Patient presents with  . Rectal Bleeding    HPI Raymond Fox is a 56 y.o. male.  HPI  Pt was seen at 1630.  Per pt's family and pt, c/o gradual onset and persistence of constant generalized abd "pain" for the past 2 days.  Has been associated with multiple intermittent episodes of N/V and "black stools and vomit." States today his stools "turned red."  Endorses hx of similar symptoms, dx "ulcer." States he also has been "cutting down drinking" etoh over the past few days. Denies tremors or withdrawal symptoms at this time. Denies fevers, no back pain, no rash, no CP/SOB.       Past Medical History:  Diagnosis Date  . Alcoholic encephalopathy (HCC) 5/64/3329  . Anemia   . Anemia associated with acute blood loss 11/01/2012  . Asthma   . Bleeding duodenal ulcer 11/01/2012  . Elevated LFTs   . ETOH abuse   . Gastric ulcer 11/01/2012  . Gastritis and duodenitis   . Helicobacter pylori antibody positive 11/03/2012   But biopsy negative for H. pylori.  Marland Kitchen Hypertension   . Pneumonia 2010  . Polysubstance abuse    Rx narcotics, marijuana, etoh  . Rectal bleeding   . Shortness of breath     Patient Active Problem List   Diagnosis Date Noted  . Anemia 12/07/2012  . Elevated LFTs 12/07/2012  . Alcoholic encephalopathy (HCC) 51/88/4166  . Helicobacter pylori antibody positive 11/03/2012  . Tachycardia 11/02/2012  . Hypokalemia 11/01/2012  . History of splenectomy 11/01/2012  . Hyponatremia 11/01/2012  . Alcoholic hepatitis 11/01/2012  . Alcohol abuse 11/01/2012  . Bleeding duodenal ulcer 11/01/2012  . Gastric ulcer 11/01/2012  . Hypotension 11/01/2012  . Alcohol withdrawal syndrome (HCC) 11/01/2012  . Anemia associated with acute blood loss 11/01/2012  . GI bleed 10/29/2012  . Community acquired pneumonia 10/29/2012  . Tobacco abuse  10/29/2012    Past Surgical History:  Procedure Laterality Date  . BACK SURGERY    . BIOPSY  10/30/2012   Negative for HPylori x2  . COLON SURGERY     ?pt cannot remember  . COLONOSCOPY WITH PROPOFOL N/A 12/15/2012   Procedure: COLONOSCOPY WITH PROPOFOL;  Surgeon: West Bali, MD;  Location: [facility];  Service: Endoscopy;  Laterality: N/A;  in cecum at 0751; end at 0800 ; total withdrawal time = 9 minutes  . ESOPHAGOGASTRODUODENOSCOPY (EGD) WITH PROPOFOL  10/30/2012   Fields-multiple PUD s/p 2 hemoclips, hiatal hernia  . ESOPHAGOGASTRODUODENOSCOPY (EGD) WITH PROPOFOL N/A 12/15/2012   Procedure: ESOPHAGOGASTRODUODENOSCOPY (EGD) WITH PROPOFOL;  Surgeon: West Bali, MD;  Location: [facility];  Service: Endoscopy;  Laterality: N/A;  start at 0806  . JOINT REPLACEMENT    . SPLENECTOMY    . TOTAL HIP ARTHROPLASTY  ?2013       Home Medications    Prior to Admission medications   Medication Sig Start Date End Date Taking? Authorizing Provider  acetaminophen (TYLENOL) 500 MG tablet Take 500 mg by mouth every 6 (six) hours as needed.   Yes Historical Provider, MD  aspirin-acetaminophen-caffeine (EXCEDRIN EXTRA STRENGTH) [phone] MG tablet Take 2 tablets by mouth every 6 (six) hours as needed for headache.   Yes Historical Provider, MD    Family History Family History  Problem Relation Age of Onset  . Lung cancer Father   .  Cirrhosis Brother     ETOH  . Drug abuse Sister   . Seizures Brother     Social History Social History  Substance Use Topics  . Smoking status: Current Every Day Smoker    Packs/day: 1.00    Years: 30.00    Types: Cigarettes  . Smokeless tobacco: Never Used  . Alcohol use Yes     Comment: drinks typically a 40 oz     Allergies   Penicillins   Review of Systems Review of Systems ROS: Statement: All systems negative except as marked or noted in the HPI; Constitutional: Negative for fever and chills. ; ; Eyes: Negative for eye pain, redness and  discharge. ; ; ENMT: Negative for ear pain, hoarseness, nasal congestion, sinus pressure and sore throat. ; ; Cardiovascular: Negative for chest pain, palpitations, diaphoresis, dyspnea and peripheral edema. ; ; Respiratory: Negative for cough, wheezing and stridor. ; ; Gastrointestinal: +nausea, vomiting, diarrhea, abdominal pain, blood in stool, hematemesis. Negative for jaundice. . ; ; Genitourinary: Negative for dysuria, flank pain and hematuria. ; ; Musculoskeletal: Negative for back pain and neck pain. Negative for swelling and trauma.; ; Skin: Negative for pruritus, rash, abrasions, blisters, bruising and skin lesion.; ; Neuro: Negative for headache, lightheadedness and neck stiffness. Negative for weakness, altered level of consciousness, altered mental status, extremity weakness, paresthesias, involuntary movement, seizure and syncope.       Physical Exam Updated Vital Signs BP 152/95   Pulse 112   Temp 97.6 F (36.4 C)   Resp 18   Ht 5\' 6"  (1.676 m)   Wt 140 lb (63.5 kg)   SpO2 96%   BMI 22.60 kg/m   Patient Vitals for the past 24 hrs:  BP Temp Pulse Resp SpO2 Height Weight  11/12/16 1800 152/95 - 112 - 96 % - -  11/12/16 1750 (!) 153/101 - 117 - - - -  11/12/16 1730 (!) 164/106 - (!) 134 - 93 % - -  11/12/16 1715 - - (!) 127 - 92 % - -  11/12/16 1700 140/91 - 118 - 95 % - -  11/12/16 1645 - - (!) 123 - 96 % - -  11/12/16 1630 (!) 141/124 - 119 - 95 % - -  11/12/16 1525 125/88 97.6 F (36.4 C) (!) 137 18 93 % 5\' 6"  (1.676 m) 140 lb (63.5 kg)     Physical Exam 1635: Physical examination:  Nursing notes reviewed; Vital signs and O2 SAT reviewed;  Constitutional: Well developed, Well nourished, Well hydrated, Uncomfortable appearing; Head:  Normocephalic, atraumatic; Eyes: EOMI, PERRL, No scleral icterus; ENMT: Mouth and pharynx normal, Mucous membranes moist; Neck: Supple, Full range of motion, No lymphadenopathy; Cardiovascular: Tachycardic rate and rhythm, No gallop;  Respiratory: Breath sounds clear & equal bilaterally, No wheezes.  Speaking full sentences with ease, Normal respiratory effort/excursion; Chest: Nontender, Movement normal; Abdomen: Soft, +diffuse tenderness to palp.  Nondistended, Normal bowel sounds. Pt having maroon stool during exam, heme positive.; Genitourinary: No CVA tenderness; Extremities: Pulses normal, No tenderness, No edema, No calf edema or asymmetry.; Neuro: AA&Ox3, Major CN grossly intact.  Speech clear. No gross focal motor or sensory deficits in extremities.; Skin: Color normal, Warm, Dry.   ED Treatments / Results  Labs (all labs ordered are listed, but only abnormal results are displayed)   EKG  EKG Interpretation  Date/Time:  Tuesday November 12 2016 15:26:57 EST Ventricular Rate:  134 PR Interval:  138 QRS Duration: 80 QT Interval:  290 QTC  Calculation: 433 R Axis:   80 Text Interpretation:  Sinus tachycardia Otherwise normal ECG When compared with ECG of 12/15/2012 Rate faster Confirmed by [facility]  MD, Nicholos Johns [phone]) on 11/12/2016 4:59:46 PM       Radiology   Procedures Procedures (including critical care time)  Medications Ordered in ED Medications  iopamidol (ISOVUE-300) 61 % injection 30 mL (not administered)  pantoprazole (PROTONIX) 80 mg in sodium chloride 0.9 % 250 mL (0.32 mg/mL) infusion (8 mg/hr Intravenous New Bag/Given 11/12/16 1808)  pantoprazole (PROTONIX) injection 40 mg (not administered)  0.9 %  sodium chloride infusion (not administered)  LORazepam (ATIVAN) injection 0-4 mg (1 mg Intravenous Given 11/12/16 1808)    Followed by  LORazepam (ATIVAN) injection 0-4 mg (not administered)  thiamine (VITAMIN B-1) tablet 100 mg (not administered)    Or  thiamine (B-1) injection 100 mg (not administered)  pantoprazole (PROTONIX) 80 mg in sodium chloride 0.9 % 100 mL IVPB (80 mg Intravenous New Bag/Given 11/12/16 1736)  sodium chloride 0.9 % bolus 1,000 mL (1,000 mLs Intravenous New Bag/Given  11/12/16 1736)  iopamidol (ISOVUE-300) 61 % injection 100 mL (100 mLs Intravenous Contrast Given 11/12/16 1821)     Initial Impression / Assessment and Plan / ED Course  I have reviewed the triage vital signs and the nursing notes.  Pertinent labs & imaging results that were available during my care of the patient were reviewed by me and considered in my medical decision making (see chart for details).  MDM Reviewed: previous chart, nursing note and vitals Reviewed previous: labs and ECG Interpretation: labs, ECG, x-ray and CT scan Total time providing critical care: 30-74 minutes. This excludes time spent performing separately reportable procedures and services. Consults: gastrointestinal and admitting MD   CRITICAL CARE Performed by: Laray Anger Total critical care time: 35 minutes Critical care time was exclusive of separately billable procedures and treating other patients. Critical care was necessary to treat or prevent imminent or life-threatening deterioration. Critical care was time spent personally by me on the following activities: development of treatment plan with patient and/or surrogate as well as nursing, discussions with consultants, evaluation of patient's response to treatment, examination of patient, obtaining history from patient or surrogate, ordering and performing treatments and interventions, ordering and review of laboratory studies, ordering and review of radiographic studies, pulse oximetry and re-evaluation of patient's condition.   Results for orders placed or performed during the hospital encounter of 11/12/16  Comprehensive metabolic panel  Result Value Ref Range   Sodium 131 (L) 135 - 145 mmol/L   Potassium 4.3 3.5 - 5.1 mmol/L   Chloride 97 (L) 101 - 111 mmol/L   CO2 24 22 - 32 mmol/L   Glucose, Bld 116 (H) 65 - 99 mg/dL   BUN 41 (H) 6 - 20 mg/dL   Creatinine, Ser 1.19 0.61 - 1.24 mg/dL   Calcium 9.1 8.9 - 14.7 mg/dL   Total Protein 7.8 6.5 -  8.1 g/dL   Albumin 3.3 (L) 3.5 - 5.0 g/dL   AST 16 15 - 41 U/L   ALT 9 (L) 17 - 63 U/L   Alkaline Phosphatase 80 38 - 126 U/L   Total Bilirubin 0.4 0.3 - 1.2 mg/dL   GFR calc non Af Amer >60 >60 mL/min   GFR calc Af Amer >60 >60 mL/min   Anion gap 10 5 - 15  CBC  Result Value Ref Range   WBC 21.8 (H) 4.0 - 10.5 K/uL   RBC 3.76 (L)  4.22 - 5.81 MIL/uL   Hemoglobin 9.7 (L) 13.0 - 17.0 g/dL   HCT 16.130.0 (L) 09.639.0 - 04.552.0 %   MCV 79.8 78.0 - 100.0 fL   MCH 25.8 (L) 26.0 - 34.0 pg   MCHC 32.3 30.0 - 36.0 g/dL   RDW 40.918.3 (H) 81.111.5 - 91.415.5 %   Platelets 511 (H) 150 - 400 K/uL  Lactic acid, plasma  Result Value Ref Range   Lactic Acid, Venous 1.6 0.5 - 1.9 mmol/L  Lipase, blood  Result Value Ref Range   Lipase 16 11 - 51 U/L  Troponin I  Result Value Ref Range   Troponin I <0.03 <0.03 ng/mL  Ethanol  Result Value Ref Range   Alcohol, Ethyl (B) <5 <5 mg/dL  POC occult blood, ED  Result Value Ref Range   Fecal Occult Bld POSITIVE (A) NEGATIVE  Type and screen [facility]  Result Value Ref Range   ABO/RH(D) O POS    Antibody Screen NEG    Sample Expiration 11/15/2016    Ct Abdomen Pelvis W Contrast Result Date: 11/12/2016 CLINICAL DATA:  56 year old male with history of hematochezia at noon today. Abdominal pain for the past several days, worsening today. History of bleeding ulcer. EXAM: CT ABDOMEN AND PELVIS WITH CONTRAST TECHNIQUE: Multidetector CT imaging of the abdomen and pelvis was performed using the standard protocol following bolus administration of intravenous contrast. CONTRAST:  100mL ISOVUE-300 IOPAMIDOL (ISOVUE-300) INJECTION 61% COMPARISON:  CT the abdomen and pelvis 10/29/2012. FINDINGS: Lower chest: Scattered areas of mild cylindrical bronchiectasis are noted in the visualize lung bases, similar to prior studies. Hepatobiliary: No cystic or solid hepatic lesions. No intra or extrahepatic biliary ductal dilatation. Gallbladder is unremarkable in appearance. Pancreas:  No pancreatic mass. No pancreatic ductal dilatation. No pancreatic or peripancreatic fluid or inflammatory changes. Spleen: Status post splenectomy, with a large splenule immediately medial to the left kidney, similar to the prior study. Adrenals/Urinary Tract: Bilateral kidneys and bilateral adrenal glands are normal in appearance. No hydroureteronephrosis. Urinary bladder is unremarkable in appearance (partially obscured by beam hardening artifact from the patient's right hip arthroplasty). Stomach/Bowel: The appearance of the stomach is normal. There is no pathologic dilatation of small bowel or colon. The appendix is not confidently identified and may be surgically absent. Regardless, there are no inflammatory changes noted adjacent to the cecum to suggest the presence of an acute appendicitis at this time. Vascular/Lymphatic: Aortic atherosclerosis, without evidence of aneurysm or dissection in the abdominal or pelvic vasculature. No lymphadenopathy noted in the abdomen or pelvis. Reproductive: Prostate gland seminal vesicles are unremarkable in appearance. Other: No significant volume of ascites.  No pneumoperitoneum. Musculoskeletal: Status post PLIF at L5-S1 with interbody graft at L5-S1 interspace. There are no aggressive appearing lytic or blastic lesions noted in the visualized portions of the skeleton. Status post right hip arthroplasty. IMPRESSION: 1. No acute findings in the abdomen or pelvis to account for the patient's symptoms. 2. Scattered areas of cylindrical bronchiectasis are again noted in the lung bases bilaterally, chronic and similar to prior examinations. 3. Aortic atherosclerosis. 4. Additional incidental findings, as above. Electronically Signed   By: Trudie Reedaniel  Entrikin M.D.   On: 11/12/2016 18:41    Results for Victoriano LainGILLIE, Jojuan R (MRN [id number]) as of 11/12/2016 19:16  Ref. Range 10/31/2012 07:47 11/01/2012 05:13 11/02/2012 04:23 12/07/2012 12:59 11/12/2016 15:56  Hemoglobin Latest Ref  Range: 13.0 - 17.0 g/dL 08.610.2 (L) 57.810.7 (L) 46.910.6 (L) 13.8 9.7 (L)  HCT Latest Ref Range:  39.0 - 52.0 % 30.3 (L) 32.4 (L) 31.6 (L) 40.5 30.0 (L)    1910:   IV protonix bolus and gtt started on pt's arrival. CIWA protocol also started, given hx etoh use. H/H currently near baseline; T&S ordered.   T/C to GI Dr. Darrick Penna, case discussed, including:  HPI, pertinent PM/SHx, VS/PE, dx testing, ED course and treatment:  Agreeable to consult, requests to start IV octreotide given pt's continued etoh use.  T/C to [facility] Dr. Onalee Hua, case discussed, including:  HPI, pertinent PM/SHx, VS/PE, dx testing, ED course and treatment:  Agreeable to admit, requests to write temporary orders, obtain ICU bed to team [facility].   Final Clinical Impressions(s) / ED Diagnoses   Final diagnoses:  None    New Prescriptions New Prescriptions   No medications on file     Samuel Jester, DO 11/15/16 1547

## 2016-11-13 DIAGNOSIS — K264 Chronic or unspecified duodenal ulcer with hemorrhage: Secondary | ICD-10-CM

## 2016-11-13 DIAGNOSIS — Z9081 Acquired absence of spleen: Secondary | ICD-10-CM

## 2016-11-13 DIAGNOSIS — K259 Gastric ulcer, unspecified as acute or chronic, without hemorrhage or perforation: Secondary | ICD-10-CM

## 2016-11-13 DIAGNOSIS — F1010 Alcohol abuse, uncomplicated: Secondary | ICD-10-CM

## 2016-11-13 DIAGNOSIS — K922 Gastrointestinal hemorrhage, unspecified: Secondary | ICD-10-CM

## 2016-11-13 DIAGNOSIS — K920 Hematemesis: Secondary | ICD-10-CM

## 2016-11-13 DIAGNOSIS — D62 Acute posthemorrhagic anemia: Secondary | ICD-10-CM

## 2016-11-13 LAB — COMPREHENSIVE METABOLIC PANEL
ALBUMIN: 2.7 g/dL — AB (ref 3.5–5.0)
ALK PHOS: 65 U/L (ref 38–126)
ALT: 8 U/L — AB (ref 17–63)
ALT: 9 U/L — AB (ref 17–63)
AST: 17 U/L (ref 15–41)
AST: 19 U/L (ref 15–41)
Albumin: 2.9 g/dL — ABNORMAL LOW (ref 3.5–5.0)
Alkaline Phosphatase: 69 U/L (ref 38–126)
Anion gap: 5 (ref 5–15)
Anion gap: 7 (ref 5–15)
BILIRUBIN TOTAL: 1.4 mg/dL — AB (ref 0.3–1.2)
BUN: 25 mg/dL — AB (ref 6–20)
BUN: 15 mg/dL (ref 6–20)
CALCIUM: 8.2 mg/dL — AB (ref 8.9–10.3)
CHLORIDE: 99 mmol/L — AB (ref 101–111)
CO2: 27 mmol/L (ref 22–32)
CO2: 26 mmol/L (ref 22–32)
CREATININE: 0.95 mg/dL (ref 0.61–1.24)
CREATININE: 0.83 mg/dL (ref 0.61–1.24)
Calcium: 8.4 mg/dL — ABNORMAL LOW (ref 8.9–10.3)
Chloride: 102 mmol/L (ref 101–111)
GFR calc Af Amer: >60 mL/min (ref >60)
GFR calc non Af Amer: >60 mL/min (ref >60)
GFR calc non Af Amer: >60 mL/min (ref >60)
GLUCOSE: 123 mg/dL — AB (ref 65–99)
Glucose, Bld: 95 mg/dL (ref 65–99)
POTASSIUM: 4 mmol/L (ref 3.5–5.1)
Potassium: 5.1 mmol/L (ref 3.5–5.1)
SODIUM: 132 mmol/L — AB (ref 135–145)
Sodium: 134 mmol/L — ABNORMAL LOW (ref 135–145)
Total Bilirubin: 1 mg/dL (ref 0.3–1.2)
Total Protein: 6.2 g/dL — ABNORMAL LOW (ref 6.5–8.1)
Total Protein: 6.6 g/dL (ref 6.5–8.1)

## 2016-11-13 LAB — IRON AND TIBC
Iron: 32 ug/dL — ABNORMAL LOW (ref 45–182)
SATURATION RATIOS: 10 % — AB (ref 17.9–39.5)
TIBC: 314 ug/dL (ref 250–450)
UIBC: 282 ug/dL

## 2016-11-13 LAB — CBC
HCT: 30.1 % — ABNORMAL LOW (ref 39.0–52.0)
HEMOGLOBIN: 10.1 g/dL — AB (ref 13.0–17.0)
MCH: 26.9 pg (ref 26.0–34.0)
MCHC: 33.6 g/dL (ref 30.0–36.0)
MCV: 80.3 fL (ref 78.0–100.0)
Platelets: 368 10*3/uL (ref 150–400)
RBC: 3.75 MIL/uL — AB (ref 4.22–5.81)
RDW: 18.1 % — ABNORMAL HIGH (ref 11.5–15.5)
WBC: 15 10*3/uL — AB (ref 4.0–10.5)

## 2016-11-13 LAB — HEMOGLOBIN AND HEMATOCRIT, BLOOD
HCT: 30.4 % — ABNORMAL LOW (ref 39.0–52.0)
HCT: 31.3 % — ABNORMAL LOW (ref 39.0–52.0)
HEMATOCRIT: 26.5 % — AB (ref 39.0–52.0)
HEMOGLOBIN: 8.9 g/dL — AB (ref 13.0–17.0)
Hemoglobin: 10.1 g/dL — ABNORMAL LOW (ref 13.0–17.0)
Hemoglobin: 10.5 g/dL — ABNORMAL LOW (ref 13.0–17.0)

## 2016-11-13 LAB — FOLATE: Folate: 15.3 ng/mL (ref >5.9)

## 2016-11-13 LAB — PREPARE RBC (CROSSMATCH)

## 2016-11-13 LAB — FERRITIN: FERRITIN: 35 ng/mL (ref 24–336)

## 2016-11-13 LAB — VITAMIN B12: Vitamin B-12: 263 pg/mL (ref 180–914)

## 2016-11-13 MED ORDER — CHLORDIAZEPOXIDE HCL 5 MG PO CAPS
10.0000 mg | ORAL_CAPSULE | Freq: Three times a day (TID) | ORAL | Status: DC
  Administered 2016-11-13 – 2016-11-15 (×8): 10 mg via ORAL
  Filled 2016-11-13 (×10): qty 2

## 2016-11-13 MED ORDER — LORAZEPAM 2 MG/ML IJ SOLN
4.0000 mg | Freq: Once | INTRAMUSCULAR | Status: AC
  Administered 2016-11-13: 4 mg via INTRAVENOUS

## 2016-11-13 MED ORDER — LORAZEPAM 2 MG/ML IJ SOLN
INTRAMUSCULAR | Status: AC
  Filled 2016-11-13: qty 2

## 2016-11-13 MED ORDER — CLONIDINE HCL 0.1 MG PO TABS
0.1000 mg | ORAL_TABLET | Freq: Three times a day (TID) | ORAL | Status: DC
  Administered 2016-11-13 – 2016-11-18 (×15): 0.1 mg via ORAL
  Filled 2016-11-13 (×16): qty 1

## 2016-11-13 MED ORDER — OCTREOTIDE ACETATE 500 MCG/ML IJ SOLN
INTRAMUSCULAR | Status: AC
  Filled 2016-11-13: qty 1

## 2016-11-13 MED ORDER — PANTOPRAZOLE SODIUM 40 MG IV SOLR
INTRAVENOUS | Status: AC
  Filled 2016-11-13: qty 80

## 2016-11-13 MED ORDER — HYDRALAZINE HCL 20 MG/ML IJ SOLN
10.0000 mg | Freq: Four times a day (QID) | INTRAMUSCULAR | Status: DC | PRN
  Administered 2016-11-13 – 2016-11-15 (×3): 10 mg via INTRAVENOUS
  Filled 2016-11-13 (×3): qty 1

## 2016-11-13 NOTE — Progress Notes (Signed)
PROGRESS NOTE                                                                                                                                                                                                             Patient Demographics:    Raymond Fox, is a 56 y.o. male, DOB - June 13, 1961, UXL:[id number]  Admit date - 11/12/2016   Admitting Physician Haydee Monica, MD  Outpatient Primary MD for the patient is MUSE,ROCHELLE D., PA-C  LOS - 1  Chief Complaint  Patient presents with  . Rectal Bleeding       Brief Narrative   MUSSA GROESBECK is a 56 y.o. male with medical history significant of etoh abuse, gastric ulcer, duodenal ulcer, asthma, polysubstance abuse comes in with 2 days of epigastric abdominal pain and what started off as melana, then progressed to [facility] colored stool, then he started getting nauseated and vomiting dark reddish vomit today.  He denies any fevers.  His last drink was yesterday.  Pt is very nauseated and is coughing a lot.  He has a h/o seizures before I presume from etoh withdrawal.  Pt referred for admission for gib.  Pt had egd in 2014 when he had a bleeding ulcer, at that time had no varices.  Dr fields called with GI and is aware of pt, advised starting an octreotide drip in addition to protonix drip   Subjective:    [name] today has, No headache, No chest pain, No abdominal pain - No Nausea, No new weakness tingling or numbness, No Cough - SOB. Appears to be unreliable historian at this time.   Assessment  & Plan :     1.Upper GI bleed. History of peptic ulcer disease in the past. Also alcoholic hence at risk for developing esophageal varices. Did develop blood loss related acute anemia, was transfused 2 units of packed RBC, continue to monitor H&H, for now continue IV PPI and octreotide, GI on board.  2. Alcohol abuse with DTs. Schedule Librium and  3. High blood  pressure. Likely due to #2 above. Placed on Catapres and as needed hydralazine.    Family Communication  :  None  Code Status :  Full  Diet : Diet NPO time specified Except for: Sips with Meds   Disposition Plan  :  Stepdown  Consults  :  GI  Procedures  :    DVT Prophylaxis  :  SCDs   Lab Results  Component Value Date   PLT 368 11/13/2016    Inpatient Medications  Scheduled Meds: . chlordiazePOXIDE  10 mg Oral TID  . cloNIDine  0.1 mg Oral TID  . folic acid  1 mg Oral Daily  . Influenza vac split quadrivalent PF  0.5 mL Intramuscular Tomorrow-1000  . LORazepam  0-4 mg Intravenous Q6H   Followed by  . [START ON 11/14/2016] LORazepam  0-4 mg Intravenous Q12H  . multivitamin with minerals  1 tablet Oral Daily  . piperacillin-tazobactam (ZOSYN)  IV  3.375 g Intravenous Q8H  . thiamine  100 mg Oral Daily   Or  . thiamine  100 mg Intravenous Daily   Continuous Infusions: . sodium chloride 100 mL/hr at 11/13/16 0600  . octreotide  (SANDOSTATIN)    IV infusion 50 mcg/hr (11/13/16 0634)  . pantoprozole (PROTONIX) infusion 8 mg/hr (11/13/16 0600)   PRN Meds:.hydrALAZINE, LORazepam **OR** LORazepam, ondansetron **OR** ondansetron (ZOFRAN) IV  Antibiotics  :    Anti-infectives    Start     Dose/Rate Route Frequency Ordered Stop   11/12/16 2200  piperacillin-tazobactam (ZOSYN) IVPB 3.375 g     3.375 g 12.5 mL/hr over 240 Minutes Intravenous Every 8 hours 11/12/16 2150           Objective:   Vitals:   11/13/16 0800 11/13/16 0900 11/13/16 1000 11/13/16 1154  BP: (!) 148/91 (!) 141/92 135/79   Pulse: 79 78 71   Resp:   (!) 23   Temp:    97.4 F (36.3 C)  TempSrc:    Oral  SpO2: 99% 97% 98%   Weight:      Height:        Wt Readings from Last 3 Encounters:  11/13/16 54.4 kg (119 lb 14.9 oz)  12/07/12 61.1 kg (134 lb 9.6 oz)  11/02/12 60 kg (132 lb 4.4 oz)     Intake/Output Summary (Last 24 hours) at 11/13/16 1324 Last data filed at 11/13/16 1000  Gross  per 24 hour  Intake          4320.83 ml  Output              500 ml  Net          3820.83 ml     Physical Exam  Awake but confused, in DTs, No new F.N deficits,  [place].AT,PERRAL Supple Neck,No JVD, No cervical lymphadenopathy appriciated.  Symmetrical Chest wall movement, Good air movement bilaterally, CTAB RRR,No Gallops,Rubs or new Murmurs, No Parasternal Heave +ve B.Sounds, Abd Soft, No tenderness, No organomegaly appriciated, No rebound - guarding or rigidity. No Cyanosis, Clubbing or edema, No new Rash or bruise       Data Review:    CBC  Recent Labs Lab 11/12/16 1556 11/12/16 2300 11/13/16 0624 11/13/16 1016  WBC 21.8*  --  15.0*  --   HGB 9.7* 7.8* 10.1* 10.1*  HCT 30.0*  --  30.1* 30.4*  PLT 511*  --  368  --   MCV 79.8  --  80.3  --   MCH 25.8*  --  26.9  --   MCHC 32.3  --  33.6  --   RDW 18.3*  --  18.1*  --     Chemistries   Recent Labs Lab 11/12/16 1556 11/13/16 0624  NA 131* 134*  K 4.3 5.1  CL 97* 102  CO2  24 27  GLUCOSE 116* 123*  BUN 41* 25*  CREATININE 0.99 0.95  CALCIUM 9.1 8.2*  AST 16 17  ALT 9* 8*  ALKPHOS 80 65  BILITOT 0.4 1.4*   ------------------------------------------------------------------------------------------------------------------ No results for input(s): CHOL, HDL, LDLCALC, TRIG, CHOLHDL, LDLDIRECT in the last 72 hours.  No results found for: HGBA1C ------------------------------------------------------------------------------------------------------------------ No results for input(s): TSH, T4TOTAL, T3FREE, THYROIDAB in the last 72 hours.  Invalid input(s): FREET3 ------------------------------------------------------------------------------------------------------------------  Recent Labs  11/12/16 1556 11/13/16 0624  VITAMINB12 263  --   FOLATE  --  15.3  FERRITIN 35  --   TIBC 314  --   IRON 32*  --   RETICCTPCT 1.7  --     Coagulation profile  Recent Labs Lab 11/12/16 1918  INR 0.97    No  results for input(s): DDIMER in the last 72 hours.  Cardiac Enzymes  Recent Labs Lab 11/12/16 1629  TROPONINI <0.03   ------------------------------------------------------------------------------------------------------------------ No results found for: BNP  Micro Results Recent Results (from the past 240 hour(s))  MRSA PCR Screening     Status: None   Collection Time: 11/12/16  9:27 PM  Result Value Ref Range Status   MRSA by PCR NEGATIVE NEGATIVE Final    Comment:        The GeneXpert MRSA Assay (FDA approved for NASAL specimens only), is one component of a comprehensive MRSA colonization surveillance program. It is not intended to diagnose MRSA infection nor to guide or monitor treatment for MRSA infections.     Radiology Reports Dg Chest 2 View  Result Date: 11/12/2016 CLINICAL DATA:  Bright red blood in stool.  Abdominal pain. EXAM: CHEST  2 VIEW COMPARISON:  02/04/2016 FINDINGS: Multiple old left rib fractures. New densities at the left costophrenic angle. Evidence for lung hyperinflation. Heart and mediastinum are within normal limits. Again noted is a compression fracture in the mid thoracic spine. No large pleural effusions. IMPRESSION: Chronic hyperinflation with new left basilar densities. Findings could represent atelectasis. Electronically Signed   By: Richarda OverlieAdam  Henn M.D.   On: 11/12/2016 18:56   Ct Abdomen Pelvis W Contrast  Result Date: 11/12/2016 CLINICAL DATA:  56 year old male with history of hematochezia at noon today. Abdominal pain for the past several days, worsening today. History of bleeding ulcer. EXAM: CT ABDOMEN AND PELVIS WITH CONTRAST TECHNIQUE: Multidetector CT imaging of the abdomen and pelvis was performed using the standard protocol following bolus administration of intravenous contrast. CONTRAST:  100mL ISOVUE-300 IOPAMIDOL (ISOVUE-300) INJECTION 61% COMPARISON:  CT the abdomen and pelvis 10/29/2012. FINDINGS: Lower chest: Scattered areas of mild  cylindrical bronchiectasis are noted in the visualize lung bases, similar to prior studies. Hepatobiliary: No cystic or solid hepatic lesions. No intra or extrahepatic biliary ductal dilatation. Gallbladder is unremarkable in appearance. Pancreas: No pancreatic mass. No pancreatic ductal dilatation. No pancreatic or peripancreatic fluid or inflammatory changes. Spleen: Status post splenectomy, with a large splenule immediately medial to the left kidney, similar to the prior study. Adrenals/Urinary Tract: Bilateral kidneys and bilateral adrenal glands are normal in appearance. No hydroureteronephrosis. Urinary bladder is unremarkable in appearance (partially obscured by beam hardening artifact from the patient's right hip arthroplasty). Stomach/Bowel: The appearance of the stomach is normal. There is no pathologic dilatation of small bowel or colon. The appendix is not confidently identified and may be surgically absent. Regardless, there are no inflammatory changes noted adjacent to the cecum to suggest the presence of an acute appendicitis at this time. Vascular/Lymphatic: Aortic atherosclerosis, without evidence  of aneurysm or dissection in the abdominal or pelvic vasculature. No lymphadenopathy noted in the abdomen or pelvis. Reproductive: Prostate gland seminal vesicles are unremarkable in appearance. Other: No significant volume of ascites.  No pneumoperitoneum. Musculoskeletal: Status post PLIF at L5-S1 with interbody graft at L5-S1 interspace. There are no aggressive appearing lytic or blastic lesions noted in the visualized portions of the skeleton. Status post right hip arthroplasty. IMPRESSION: 1. No acute findings in the abdomen or pelvis to account for the patient's symptoms. 2. Scattered areas of cylindrical bronchiectasis are again noted in the lung bases bilaterally, chronic and similar to prior examinations. 3. Aortic atherosclerosis. 4. Additional incidental findings, as above. Electronically Signed    By: Trudie Reed M.D.   On: 11/12/2016 18:41    Time Spent in minutes  30   Avaya Mcjunkins K M.D on 11/13/2016 at 1:24 PM  Between 7am to 7pm - Pager - [phone]  After 7pm go to [email or web address] - password [facility]  [facility] -  Office  [phone]

## 2016-11-13 NOTE — Consult Note (Signed)
Reason for Consult: Referring Physician:   MARX Fox is an 56 y.o. male.  HPI: Admitted thru the ED yesterday with rectal bleeding. Has been bleeding for a couple of days.  He says the blood was dark red. Denies prior hx of rectal bleeding. Patient is a poor historian. Per records, hx significant for etoh abuse. He says he has vomited some blood.  Hx of seizures. Admission hemoglobin 9.7. Hemoglobin today is 10.1.  Has received 2 units of PRBCs. Had BM during the night without any obvious blood. Apparently became restless during the night. EGD 12/15/2012:  Ulcers on EGD in January of 2014 Schatzki ring, mild gastritis, mild duodenitis. The duodenal mucosa showed no abnormalities in the 2nd part of the duodenum.   EGD in 10/30/2012:  ENDOSCOPIC IMPRESSION: 1.   PUD-CAUSE FOR MELENA AND POSSIBLY BRBPR 2.  HIATAL HERNIA    Past Medical History:  Diagnosis Date  . Alcoholic encephalopathy ([place]) 11/03/2012  . Anemia   . Anemia associated with acute blood loss 11/01/2012  . Asthma   . Bleeding duodenal ulcer 11/01/2012  . Elevated LFTs   . ETOH abuse   . Gastric ulcer 11/01/2012  . Gastritis and duodenitis   . Helicobacter pylori antibody positive 11/03/2012   But biopsy negative for H. pylori.  Marland Kitchen Hypertension   . Pneumonia 2010  . Polysubstance abuse    Rx narcotics, marijuana, etoh  . Rectal bleeding   . Shortness of breath     Past Surgical History:  Procedure Laterality Date  . BACK SURGERY    . BIOPSY  10/30/2012   Negative for HPylori x2  . COLON SURGERY     ?pt cannot remember  . COLONOSCOPY WITH PROPOFOL N/A 12/15/2012   Procedure: COLONOSCOPY WITH PROPOFOL;  Surgeon: Danie Binder, MD;  Location: [facility];  Service: Endoscopy;  Laterality: N/A;  in cecum at 0751; end at 0800 ; total withdrawal time = 9 minutes  . ESOPHAGOGASTRODUODENOSCOPY (EGD) WITH PROPOFOL  10/30/2012   Fields-multiple PUD s/p 2 hemoclips, hiatal hernia  . ESOPHAGOGASTRODUODENOSCOPY (EGD) WITH  PROPOFOL N/A 12/15/2012   Procedure: ESOPHAGOGASTRODUODENOSCOPY (EGD) WITH PROPOFOL;  Surgeon: Danie Binder, MD;  Location: [facility];  Service: Endoscopy;  Laterality: N/A;  start at 0806  . JOINT REPLACEMENT    . SPLENECTOMY    . TOTAL HIP ARTHROPLASTY  ?2013    Family History  Problem Relation Age of Onset  . Lung cancer Father   . Cirrhosis Brother     ETOH  . Drug abuse Sister   . Seizures Brother     Social History:  reports that he has been smoking Cigarettes.  He has a 30.00 pack-year smoking history. He has never used smokeless tobacco. He reports that he drinks alcohol. He reports that he uses drugs, including Marijuana.  Allergies:  Allergies  Allergen Reactions  . Penicillins Rash    Has patient had a PCN reaction causing immediate rash, facial/tongue/throat swelling, SOB or lightheadedness with hypotension: Yes Has patient had a PCN reaction causing severe rash involving mucus membranes or skin necrosis: No Has patient had a PCN reaction that required hospitalization No Has patient had a PCN reaction occurring within the last 10 years: No If all of the above answers are "NO", then may proceed with Cephalosporin use. 1    Medications: I have reviewed the patient's current medications.  Results for orders placed or performed during the hospital encounter of 11/12/16 (from the past 48 hour(s))  Comprehensive  metabolic panel     Status: Abnormal   Collection Time: 11/12/16  3:56 PM  Result Value Ref Range   Sodium 131 (L) 135 - 145 mmol/L   Potassium 4.3 3.5 - 5.1 mmol/L   Chloride 97 (L) 101 - 111 mmol/L   CO2 24 22 - 32 mmol/L   Glucose, Bld 116 (H) 65 - 99 mg/dL   BUN 41 (H) 6 - 20 mg/dL   Creatinine, Ser 0.99 0.61 - 1.24 mg/dL   Calcium 9.1 8.9 - 10.3 mg/dL   Total Protein 7.8 6.5 - 8.1 g/dL   Albumin 3.3 (L) 3.5 - 5.0 g/dL   AST 16 15 - 41 U/L   ALT 9 (L) 17 - 63 U/L   Alkaline Phosphatase 80 38 - 126 U/L   Total Bilirubin 0.4 0.3 - 1.2 mg/dL   GFR calc  non Af Amer >60 >60 mL/min   GFR calc Af Amer >60 >60 mL/min    Comment: (NOTE) The eGFR has been calculated using the CKD EPI equation. This calculation has not been validated in all clinical situations. eGFR's persistently <60 mL/min signify possible Chronic Kidney Disease.    Anion gap 10 5 - 15  CBC     Status: Abnormal   Collection Time: 11/12/16  3:56 PM  Result Value Ref Range   WBC 21.8 (H) 4.0 - 10.5 K/uL   RBC 3.76 (L) 4.22 - 5.81 MIL/uL   Hemoglobin 9.7 (L) 13.0 - 17.0 g/dL   HCT 30.0 (L) 39.0 - 52.0 %   MCV 79.8 78.0 - 100.0 fL   MCH 25.8 (L) 26.0 - 34.0 pg   MCHC 32.3 30.0 - 36.0 g/dL   RDW 18.3 (H) 11.5 - 15.5 %   Platelets 511 (H) 150 - 400 K/uL  Type and screen [facility]     Status: None (Preliminary result)   Collection Time: 11/12/16  3:56 PM  Result Value Ref Range   ABO/RH(D) O POS    Antibody Screen NEG    Sample Expiration 11/15/2016    Unit Number [id number]    Blood Component Type RED CELLS,LR    Unit division 00    Status of Unit ISSUED    Transfusion Status OK TO TRANSFUSE    Crossmatch Result Compatible    Unit Number [id number]    Blood Component Type RED CELLS,LR    Unit division 00    Status of Unit ISSUED    Transfusion Status OK TO TRANSFUSE    Crossmatch Result Compatible   Reticulocytes     Status: Abnormal   Collection Time: 11/12/16  3:56 PM  Result Value Ref Range   Retic Ct Pct 1.7 0.4 - 3.1 %   RBC. 3.74 (L) 4.22 - 5.81 MIL/uL   Retic Count, Manual 63.6 19.0 - 186.0 K/uL  Prepare RBC     Status: None   Collection Time: 11/12/16  3:56 PM  Result Value Ref Range   Order Confirmation ORDER PROCESSED BY BLOOD BANK   Ethanol     Status: None   Collection Time: 11/12/16  4:00 PM  Result Value Ref Range   Alcohol, Ethyl (B) <5 <5 mg/dL    Comment:        LOWEST DETECTABLE LIMIT FOR SERUM ALCOHOL IS 5 mg/dL FOR MEDICAL PURPOSES ONLY   Lactic acid, plasma     Status: None   Collection Time: 11/12/16  4:29 PM   Result Value Ref Range   Lactic Acid, Venous  1.6 0.5 - 1.9 mmol/L  Lipase, blood     Status: None   Collection Time: 11/12/16  4:29 PM  Result Value Ref Range   Lipase 16 11 - 51 U/L  Troponin I     Status: None   Collection Time: 11/12/16  4:29 PM  Result Value Ref Range   Troponin I <0.03 <0.03 ng/mL  POC occult blood, ED     Status: Abnormal   Collection Time: 11/12/16  4:43 PM  Result Value Ref Range   Fecal Occult Bld POSITIVE (A) NEGATIVE  Protime-INR     Status: None   Collection Time: 11/12/16  7:18 PM  Result Value Ref Range   Prothrombin Time 12.9 11.4 - 15.2 seconds   INR 0.97   Lactic acid, plasma     Status: None   Collection Time: 11/12/16  7:31 PM  Result Value Ref Range   Lactic Acid, Venous 1.5 0.5 - 1.9 mmol/L  MRSA PCR Screening     Status: None   Collection Time: 11/12/16  9:27 PM  Result Value Ref Range   MRSA by PCR NEGATIVE NEGATIVE    Comment:        The GeneXpert MRSA Assay (FDA approved for NASAL specimens only), is one component of a comprehensive MRSA colonization surveillance program. It is not intended to diagnose MRSA infection nor to guide or monitor treatment for MRSA infections.   Urine rapid drug screen (hosp performed)     Status: Abnormal   Collection Time: 11/12/16 10:00 PM  Result Value Ref Range   Opiates POSITIVE (A) NONE DETECTED   Cocaine NONE DETECTED NONE DETECTED   Benzodiazepines POSITIVE (A) NONE DETECTED   Amphetamines NONE DETECTED NONE DETECTED   Tetrahydrocannabinol NONE DETECTED NONE DETECTED   Barbiturates NONE DETECTED NONE DETECTED    Comment:        DRUG SCREEN FOR MEDICAL PURPOSES ONLY.  IF CONFIRMATION IS NEEDED FOR ANY PURPOSE, NOTIFY LAB WITHIN 5 DAYS.        LOWEST DETECTABLE LIMITS FOR URINE DRUG SCREEN Drug Class       Cutoff (ng/mL) Amphetamine      1000 Barbiturate      200 Benzodiazepine   427 Tricyclics       062 Opiates          300 Cocaine          300 THC              50    Hemoglobin     Status: Abnormal   Collection Time: 11/12/16 11:00 PM  Result Value Ref Range   Hemoglobin 7.8 (L) 13.0 - 17.0 g/dL  CBC     Status: Abnormal   Collection Time: 11/13/16  6:24 AM  Result Value Ref Range   WBC 15.0 (H) 4.0 - 10.5 K/uL   RBC 3.75 (L) 4.22 - 5.81 MIL/uL   Hemoglobin 10.1 (L) 13.0 - 17.0 g/dL    Comment: DELTA CHECK NOTED   HCT 30.1 (L) 39.0 - 52.0 %   MCV 80.3 78.0 - 100.0 fL   MCH 26.9 26.0 - 34.0 pg   MCHC 33.6 30.0 - 36.0 g/dL   RDW 18.1 (H) 11.5 - 15.5 %   Platelets 368 150 - 400 K/uL  Comprehensive metabolic panel     Status: Abnormal   Collection Time: 11/13/16  6:24 AM  Result Value Ref Range   Sodium 134 (L) 135 - 145 mmol/L   Potassium 5.1 3.5 -  5.1 mmol/L   Chloride 102 101 - 111 mmol/L   CO2 27 22 - 32 mmol/L   Glucose, Bld 123 (H) 65 - 99 mg/dL   BUN 25 (H) 6 - 20 mg/dL   Creatinine, Ser 0.95 0.61 - 1.24 mg/dL   Calcium 8.2 (L) 8.9 - 10.3 mg/dL   Total Protein 6.2 (L) 6.5 - 8.1 g/dL   Albumin 2.7 (L) 3.5 - 5.0 g/dL   AST 17 15 - 41 U/L   ALT 8 (L) 17 - 63 U/L   Alkaline Phosphatase 65 38 - 126 U/L   Total Bilirubin 1.4 (H) 0.3 - 1.2 mg/dL   GFR calc non Af Amer >60 >60 mL/min   GFR calc Af Amer >60 >60 mL/min    Comment: (NOTE) The eGFR has been calculated using the CKD EPI equation. This calculation has not been validated in all clinical situations. eGFR's persistently <60 mL/min signify possible Chronic Kidney Disease.    Anion gap 5 5 - 15    Dg Chest 2 View  Result Date: 11/12/2016 CLINICAL DATA:  Bright red blood in stool.  Abdominal pain. EXAM: CHEST  2 VIEW COMPARISON:  02/04/2016 FINDINGS: Multiple old left rib fractures. New densities at the left costophrenic angle. Evidence for lung hyperinflation. Heart and mediastinum are within normal limits. Again noted is a compression fracture in the mid thoracic spine. No large pleural effusions. IMPRESSION: Chronic hyperinflation with new left basilar densities. Findings could  represent atelectasis. Electronically Signed   By: Markus Daft M.D.   On: 11/12/2016 18:56   Ct Abdomen Pelvis W Contrast  Result Date: 11/12/2016 CLINICAL DATA:  56 year old male with history of hematochezia at noon today. Abdominal pain for the past several days, worsening today. History of bleeding ulcer. EXAM: CT ABDOMEN AND PELVIS WITH CONTRAST TECHNIQUE: Multidetector CT imaging of the abdomen and pelvis was performed using the standard protocol following bolus administration of intravenous contrast. CONTRAST:  126m ISOVUE-300 IOPAMIDOL (ISOVUE-300) INJECTION 61% COMPARISON:  CT the abdomen and pelvis 10/29/2012. FINDINGS: Lower chest: Scattered areas of mild cylindrical bronchiectasis are noted in the visualize lung bases, similar to prior studies. Hepatobiliary: No cystic or solid hepatic lesions. No intra or extrahepatic biliary ductal dilatation. Gallbladder is unremarkable in appearance. Pancreas: No pancreatic mass. No pancreatic ductal dilatation. No pancreatic or peripancreatic fluid or inflammatory changes. Spleen: Status post splenectomy, with a large splenule immediately medial to the left kidney, similar to the prior study. Adrenals/Urinary Tract: Bilateral kidneys and bilateral adrenal glands are normal in appearance. No hydroureteronephrosis. Urinary bladder is unremarkable in appearance (partially obscured by beam hardening artifact from the patient's right hip arthroplasty). Stomach/Bowel: The appearance of the stomach is normal. There is no pathologic dilatation of small bowel or colon. The appendix is not confidently identified and may be surgically absent. Regardless, there are no inflammatory changes noted adjacent to the cecum to suggest the presence of an acute appendicitis at this time. Vascular/Lymphatic: Aortic atherosclerosis, without evidence of aneurysm or dissection in the abdominal or pelvic vasculature. No lymphadenopathy noted in the abdomen or pelvis. Reproductive: Prostate  gland seminal vesicles are unremarkable in appearance. Other: No significant volume of ascites.  No pneumoperitoneum. Musculoskeletal: Status post PLIF at L5-S1 with interbody graft at L5-S1 interspace. There are no aggressive appearing lytic or blastic lesions noted in the visualized portions of the skeleton. Status post right hip arthroplasty. IMPRESSION: 1. No acute findings in the abdomen or pelvis to account for the patient's symptoms. 2. Scattered areas  of cylindrical bronchiectasis are again noted in the lung bases bilaterally, chronic and similar to prior examinations. 3. Aortic atherosclerosis. 4. Additional incidental findings, as above. Electronically Signed   By: Vinnie Langton M.D.   On: 11/12/2016 18:41    ROS Blood pressure 122/80, pulse 80, temperature 98 F (36.7 C), temperature source Oral, resp. rate (!) 24, height '5\' 6"'$  (1.676 m), weight 119 lb 14.9 oz (54.4 kg), SpO2 98 %. Physical Exam Alert  Skin warm and dry. Oral mucosa is moist.   . Sclera anicteric, conjunctivae is pink. Thyroid not enlarged. No cervical lymphadenopathy. Lungs clear. Heart regular rate and rhythm.  Abdomen is soft. Bowel sounds are positive. No hepatomegaly. No abdominal masses felt. No tenderness.  No edema to lower extremities.   Assessment/Plan: Probably UGI bleed requiring 2 units of PRBCs. . Will discuss with Dr. Laural Golden.   Simra Fiebig W 11/13/2016, 8:42 AM

## 2016-11-14 DIAGNOSIS — K920 Hematemesis: Secondary | ICD-10-CM

## 2016-11-14 DIAGNOSIS — F1010 Alcohol abuse, uncomplicated: Secondary | ICD-10-CM

## 2016-11-14 DIAGNOSIS — K254 Chronic or unspecified gastric ulcer with hemorrhage: Secondary | ICD-10-CM

## 2016-11-14 LAB — TYPE AND SCREEN
ABO/RH(D): O POS
Antibody Screen: NEGATIVE
UNIT DIVISION: 0
Unit division: 0

## 2016-11-14 LAB — COMPREHENSIVE METABOLIC PANEL
ALK PHOS: 70 U/L (ref 38–126)
ALT: 7 U/L — ABNORMAL LOW (ref 17–63)
ANION GAP: 7 (ref 5–15)
AST: 17 U/L (ref 15–41)
Albumin: 2.9 g/dL — ABNORMAL LOW (ref 3.5–5.0)
BILIRUBIN TOTAL: 0.8 mg/dL (ref 0.3–1.2)
BUN: 11 mg/dL (ref 6–20)
CALCIUM: 8.5 mg/dL — AB (ref 8.9–10.3)
CO2: 26 mmol/L (ref 22–32)
Chloride: 98 mmol/L — ABNORMAL LOW (ref 101–111)
Creatinine, Ser: 0.87 mg/dL (ref 0.61–1.24)
GLUCOSE: 76 mg/dL (ref 65–99)
POTASSIUM: 3.8 mmol/L (ref 3.5–5.1)
Sodium: 131 mmol/L — ABNORMAL LOW (ref 135–145)
TOTAL PROTEIN: 6.7 g/dL (ref 6.5–8.1)

## 2016-11-14 LAB — CBC
HEMATOCRIT: 31.2 % — AB (ref 39.0–52.0)
HEMOGLOBIN: 10.5 g/dL — AB (ref 13.0–17.0)
MCH: 27.1 pg (ref 26.0–34.0)
MCHC: 33.7 g/dL (ref 30.0–36.0)
MCV: 80.4 fL (ref 78.0–100.0)
Platelets: 440 10*3/uL — ABNORMAL HIGH (ref 150–400)
RBC: 3.88 MIL/uL — ABNORMAL LOW (ref 4.22–5.81)
RDW: 18.7 % — AB (ref 11.5–15.5)
WBC: 12.9 10*3/uL — ABNORMAL HIGH (ref 4.0–10.5)

## 2016-11-14 LAB — MAGNESIUM: MAGNESIUM: 1.8 mg/dL (ref 1.7–2.4)

## 2016-11-14 MED ORDER — DEXTROSE-NACL 5-0.45 % IV SOLN: INTRAVENOUS | Status: DC

## 2016-11-14 MED ORDER — POTASSIUM CHLORIDE IN NACL 20-0.9 MEQ/L-% IV SOLN
INTRAVENOUS | Status: AC
  Administered 2016-11-14 – 2016-11-16 (×5): via INTRAVENOUS

## 2016-11-14 MED ORDER — DEXTROSE 5 % IV SOLN: 1.0000 g | INTRAVENOUS | Status: DC

## 2016-11-14 MED ORDER — NICOTINE 21 MG/24HR TD PT24
21.0000 mg | MEDICATED_PATCH | Freq: Every day | TRANSDERMAL | Status: DC
  Administered 2016-11-14 – 2016-11-21 (×8): 21 mg via TRANSDERMAL
  Filled 2016-11-14 (×8): qty 1

## 2016-11-14 NOTE — Progress Notes (Signed)
Patient was very impulsive, agitated and restless at beginning of shift, but with a PRN dose and scheduled doses of Ativan, along with relaxation techniques patient calmed down and rested well for majority of the night.  Patient refused PO meds last night, and continues to have a frequent productive strong cough. Sputum is clear ad thick.  No BM last night.  Patient speech is becoming clearer. Patient still disoriented to situation, date and time. Occasionally get's location correct.   Patient requests a Nicotine patch this morning. Smokes 1 pack/day per patient.   Patient is now questioning when he can go home as well.  Overall patient had a restful night.  Genelle Balameron D Shahram Alexopoulos, RN

## 2016-11-14 NOTE — Progress Notes (Signed)
Subjective:  Patient sedated on Ativan. Arousable, mumbles but does not provide any conversation. According to nursing staff, he has been able to follow commands, speech has been slurred but when awake that is improved. Currently with hand mittens.  Reported coughing several minutes after he takes his medication. Occurs when he is sitting upright. No coughing during swallowing of medication or sips of liquids.  Objective: Vital signs in last 24 hours: Temp:  [97.4 F (36.3 C)-98.6 F (37 C)] 98.6 F (37 C) (01/25 0804) Pulse Rate:  [71-103] 77 (01/25 0804) Resp:  [11-31] 23 (01/25 0804) BP: (124-143)/(69-95) 124/81 (01/25 0600) SpO2:  [91 %-98 %] 94 % (01/25 0804) Weight:  [115 lb 4.8 oz (52.3 kg)] 115 lb 4.8 oz (52.3 kg) (01/25 0500) Last BM Date: 11/12/16 General:   Sedated but arousable NAD Head:  Normocephalic and atraumatic. Chest: CTA bilaterally without rales, rhonchi, crackles.    Heart:  Regular rate and rhythm; no murmurs, clicks, rubs,  or gallops. Abdomen:  Soft,  Nondistended. Normal bowel sounds, without guarding, and without rebound.   Extremities:  Without clubbing, deformity or edema. Neurologic:  Unable to evaluate given sedation. Skin:  Intact without significant lesions or rashes. Psych:  Unable to evaluate   Intake/Output from previous day: 01/24 0701 - 01/25 0700 In: 2292.5 [P.O.:50; I.V.:2142.5; IV Piggyback:100] Out: 1700 [Urine:1700] Intake/Output this shift: No intake/output data recorded.  Lab Results: CBC  Recent Labs  11/12/16 1556  11/13/16 0624  11/13/16 1510 11/13/16 1818 11/14/16 0414  WBC 21.8*  --  15.0*  --   --   --  12.9*  HGB 9.7*  < > 10.1*  < > 10.5* 8.9* 10.5*  HCT 30.0*  --  30.1*  < > 31.3* 26.5* 31.2*  MCV 79.8  --  80.3  --   --   --  80.4  PLT 511*  --  368  --   --   --  440*  < > = values in this interval not displayed. BMET  Recent Labs  11/13/16 0624 11/13/16 1510 11/14/16 0414  NA 134* 132* 131*  K 5.1 4.0  3.8  CL 102 99* 98*  CO2 27 26 26   GLUCOSE 123* 95 76  BUN 25* 15 11  CREATININE 0.95 0.83 0.87  CALCIUM 8.2* 8.4* 8.5*   LFTs  Recent Labs  11/13/16 0624 11/13/16 1510 11/14/16 0414  BILITOT 1.4* 1.0 0.8  ALKPHOS 65 69 70  AST 17 19 17   ALT 8* 9* 7*  PROT 6.2* 6.6 6.7  ALBUMIN 2.7* 2.9* 2.9*    Recent Labs  11/12/16 1629  LIPASE 16   PT/INR  Recent Labs  11/12/16 1918  LABPROT 12.9  INR 0.97      Imaging Studies: Dg Chest 2 View  Result Date: 11/12/2016 CLINICAL DATA:  Bright red blood in stool.  Abdominal pain. EXAM: CHEST  2 VIEW COMPARISON:  02/04/2016 FINDINGS: Multiple old left rib fractures. New densities at the left costophrenic angle. Evidence for lung hyperinflation. Heart and mediastinum are within normal limits. Again noted is a compression fracture in the mid thoracic spine. No large pleural effusions. IMPRESSION: Chronic hyperinflation with new left basilar densities. Findings could represent atelectasis. Electronically Signed   By: Richarda Overlie M.D.   On: 11/12/2016 18:56   Ct Abdomen Pelvis W Contrast  Result Date: 11/12/2016 CLINICAL DATA:  56 year old male with history of hematochezia at noon today. Abdominal pain for the past several days, worsening today. History of  bleeding ulcer. EXAM: CT ABDOMEN AND PELVIS WITH CONTRAST TECHNIQUE: Multidetector CT imaging of the abdomen and pelvis was performed using the standard protocol following bolus administration of intravenous contrast. CONTRAST:  100mL ISOVUE-300 IOPAMIDOL (ISOVUE-300) INJECTION 61% COMPARISON:  CT the abdomen and pelvis 10/29/2012. FINDINGS: Lower chest: Scattered areas of mild cylindrical bronchiectasis are noted in the visualize lung bases, similar to prior studies. Hepatobiliary: No cystic or solid hepatic lesions. No intra or extrahepatic biliary ductal dilatation. Gallbladder is unremarkable in appearance. Pancreas: No pancreatic mass. No pancreatic ductal dilatation. No pancreatic or  peripancreatic fluid or inflammatory changes. Spleen: Status post splenectomy, with a large splenule immediately medial to the left kidney, similar to the prior study. Adrenals/Urinary Tract: Bilateral kidneys and bilateral adrenal glands are normal in appearance. No hydroureteronephrosis. Urinary bladder is unremarkable in appearance (partially obscured by beam hardening artifact from the patient's right hip arthroplasty). Stomach/Bowel: The appearance of the stomach is normal. There is no pathologic dilatation of small bowel or colon. The appendix is not confidently identified and may be surgically absent. Regardless, there are no inflammatory changes noted adjacent to the cecum to suggest the presence of an acute appendicitis at this time. Vascular/Lymphatic: Aortic atherosclerosis, without evidence of aneurysm or dissection in the abdominal or pelvic vasculature. No lymphadenopathy noted in the abdomen or pelvis. Reproductive: Prostate gland seminal vesicles are unremarkable in appearance. Other: No significant volume of ascites.  No pneumoperitoneum. Musculoskeletal: Status post PLIF at L5-S1 with interbody graft at L5-S1 interspace. There are no aggressive appearing lytic or blastic lesions noted in the visualized portions of the skeleton. Status post right hip arthroplasty. IMPRESSION: 1. No acute findings in the abdomen or pelvis to account for the patient's symptoms. 2. Scattered areas of cylindrical bronchiectasis are again noted in the lung bases bilaterally, chronic and similar to prior examinations. 3. Aortic atherosclerosis. 4. Additional incidental findings, as above. Electronically Signed   By: Trudie Reedaniel  Entrikin M.D.   On: 11/12/2016 18:41  [2 weeks]   Assessment: 56 year old gentleman with upper GI bleed possibly secondary to recurrent peptic ulcer disease in the setting of NSAID use. Cannot exclude Mallory-Weiss tear. Currently unable to provide medical history, sedated with Ativan due to  agitation overnight/DTs. He has received 2 units of packed red blood cells hemoglobin up appropriately. There is been no evidence of bleeding overnight. No BM since admission. Per prior history documented in his chart, there was history of melena, maroon stool and hematemesis.  Plan: 1. Recommend upper endoscopy when stable. Likely consider tomorrow in the OR. Patient currently being treated for DTs. Hemoglobin is stable. 2. Continue PPI. 3. Continue NPO as long as he remains sedated.  Leanna BattlesLeslie S. Dixon BoosLewis, PA-C [facility] [phone] 1/25/20189:58 AM     LOS: 2 days

## 2016-11-14 NOTE — Progress Notes (Addendum)
PROGRESS NOTE                                                                                                                                                                                                             Patient Demographics:    Raymond Fox, is a 56 y.o. male, DOB - 1961/06/27, ZOX:[id number]  Admit date - 11/12/2016   Admitting Physician Haydee Monica, MD  Outpatient Primary MD for the patient is MUSE,ROCHELLE D., PA-C  LOS - 2  Chief Complaint  Patient presents with  . Rectal Bleeding       Brief Narrative   Raymond Fox is a 56 y.o. male with medical history significant of etoh abuse, gastric ulcer, duodenal ulcer, asthma, polysubstance abuse comes in with 2 days of epigastric abdominal pain and what started off as melana, then progressed to [facility] colored stool, then he started getting nauseated and vomiting dark reddish vomit today.  He denies any fevers.  His last drink was yesterday.  Pt is very nauseated and is coughing a lot.  He has a h/o seizures before I presume from etoh withdrawal.  Pt referred for admission for gib.  Pt had egd in 2014 when he had a bleeding ulcer, at that time had no varices.  Dr fields called with GI and is aware of pt, advised starting an octreotide drip in addition to protonix drip   Subjective:    Raymond Fox today Is sleepy, denies any headache chest or abdominal pain.   Assessment  & Plan :     1.Upper GI bleed. History of peptic ulcer disease in the past. Did develop blood loss related acute anemia, was transfused 2 units of packed RBC, continue to monitor H&H, for now continue IV PPIFor GI , GI on board will likely require an EGD sometime this admission.    2. Alcohol abuse with DTs. Schedule Librium and  3. High blood pressure. Likely due to #2 above. Placed on Catapres and as needed hydralazine.  4. Smoking. Counseled to quit. Neck or down  patch.  No signs of infection. Hold antibiotics for now and monitor. He does have history of splenectomy in the past.    Family Communication  :  None  Code Status :  Full  Diet : Diet NPO time specified Except for: Sips with Meds  Disposition Plan  :  Stepdown  Consults  :  GI  Procedures  :    DVT Prophylaxis  :  SCDs   Lab Results  Component Value Date   PLT 440 (H) 11/14/2016    Inpatient Medications  Scheduled Meds: . chlordiazePOXIDE  10 mg Oral TID  . cloNIDine  0.1 mg Oral TID  . folic acid  1 mg Oral Daily  . Influenza vac split quadrivalent PF  0.5 mL Intramuscular Tomorrow-1000  . multivitamin with minerals  1 tablet Oral Daily  . nicotine  21 mg Transdermal Daily  . thiamine  100 mg Oral Daily   Or  . thiamine  100 mg Intravenous Daily   Continuous Infusions: . 0.9 % NaCl with KCl 20 mEq / L    . pantoprozole (PROTONIX) infusion 8 mg/hr (11/14/16 0110)   PRN Meds:.hydrALAZINE, LORazepam **OR** LORazepam, [DISCONTINUED] ondansetron **OR** ondansetron (ZOFRAN) IV  Antibiotics  :    Anti-infectives    Start     Dose/Rate Route Frequency Ordered Stop   11/14/16 0915  cefTRIAXone (ROCEPHIN) 1 g in dextrose 5 % 50 mL IVPB  Status:  Discontinued     1 g 100 mL/hr over 30 Minutes Intravenous Every 24 hours 11/14/16 0906 11/14/16 0906   11/12/16 2200  piperacillin-tazobactam (ZOSYN) IVPB 3.375 g  Status:  Discontinued     3.375 g 12.5 mL/hr over 240 Minutes Intravenous Every 8 hours 11/12/16 2150 11/14/16 0906         Objective:   Vitals:   11/14/16 0400 11/14/16 0500 11/14/16 0600 11/14/16 0804  BP: (!) 140/91  124/81   Pulse: 75  71 77  Resp: (!) 23  (!) 23 (!) 23  Temp: 98.1 F (36.7 C)   98.6 F (37 C)  TempSrc: Axillary   Axillary  SpO2: 94%  97% 94%  Weight:  52.3 kg (115 lb 4.8 oz)    Height:        Wt Readings from Last 3 Encounters:  11/14/16 52.3 kg (115 lb 4.8 oz)  12/07/12 61.1 kg (134 lb 9.6 oz)  11/02/12 60 kg (132 lb 4.4  oz)     Intake/Output Summary (Last 24 hours) at 11/14/16 0907 Last data filed at 11/14/16 0500  Gross per 24 hour  Intake           2292.5 ml  Output             1700 ml  Net            592.5 ml     Physical Exam  Awake but confused, in DTs, No new F.N deficits,  [place].AT,PERRAL Supple Neck,No JVD, No cervical lymphadenopathy appriciated.  Symmetrical Chest wall movement, Good air movement bilaterally, CTAB RRR,No Gallops,Rubs or new Murmurs, No Parasternal Heave +ve B.Sounds, Abd Soft, No tenderness, No organomegaly appriciated, No rebound - guarding or rigidity. No Cyanosis, Clubbing or edema, No new Rash or bruise       Data Review:    CBC  Recent Labs Lab 11/12/16 1556  11/13/16 0624 11/13/16 1016 11/13/16 1510 11/13/16 1818 11/14/16 0414  WBC 21.8*  --  15.0*  --   --   --  12.9*  HGB 9.7*  < > 10.1* 10.1* 10.5* 8.9* 10.5*  HCT 30.0*  --  30.1* 30.4* 31.3* 26.5* 31.2*  PLT 511*  --  368  --   --   --  440*  MCV 79.8  --  80.3  --   --   --  80.4  MCH 25.8*  --  26.9  --   --   --  27.1  MCHC 32.3  --  33.6  --   --   --  33.7  RDW 18.3*  --  18.1*  --   --   --  18.7*  < > = values in this interval not displayed.  Chemistries   Recent Labs Lab 11/12/16 1556 11/13/16 0624 11/13/16 1510 11/14/16 0414  NA 131* 134* 132* 131*  K 4.3 5.1 4.0 3.8  CL 97* 102 99* 98*  CO2 24 27 26 26   GLUCOSE 116* 123* 95 76  BUN 41* 25* 15 11  CREATININE 0.99 0.95 0.83 0.87  CALCIUM 9.1 8.2* 8.4* 8.5*  MG  --   --   --  1.8  AST 16 17 19 17   ALT 9* 8* 9* 7*  ALKPHOS 80 65 69 70  BILITOT 0.4 1.4* 1.0 0.8   ------------------------------------------------------------------------------------------------------------------ No results for input(s): CHOL, HDL, LDLCALC, TRIG, CHOLHDL, LDLDIRECT in the last 72 hours.  No results found for: HGBA1C ------------------------------------------------------------------------------------------------------------------ No results  for input(s): TSH, T4TOTAL, T3FREE, THYROIDAB in the last 72 hours.  Invalid input(s): FREET3 ------------------------------------------------------------------------------------------------------------------  Recent Labs  11/12/16 1556 11/13/16 0624  VITAMINB12 263  --   FOLATE  --  15.3  FERRITIN 35  --   TIBC 314  --   IRON 32*  --   RETICCTPCT 1.7  --     Coagulation profile  Recent Labs Lab 11/12/16 1918  INR 0.97    No results for input(s): DDIMER in the last 72 hours.  Cardiac Enzymes  Recent Labs Lab 11/12/16 1629  TROPONINI <0.03   ------------------------------------------------------------------------------------------------------------------ No results found for: BNP  Micro Results Recent Results (from the past 240 hour(s))  MRSA PCR Screening     Status: None   Collection Time: 11/12/16  9:27 PM  Result Value Ref Range Status   MRSA by PCR NEGATIVE NEGATIVE Final    Comment:        The GeneXpert MRSA Assay (FDA approved for NASAL specimens only), is one component of a comprehensive MRSA colonization surveillance program. It is not intended to diagnose MRSA infection nor to guide or monitor treatment for MRSA infections.     Radiology Reports Dg Chest 2 View  Result Date: 11/12/2016 CLINICAL DATA:  Bright red blood in stool.  Abdominal pain. EXAM: CHEST  2 VIEW COMPARISON:  02/04/2016 FINDINGS: Multiple old left rib fractures. New densities at the left costophrenic angle. Evidence for lung hyperinflation. Heart and mediastinum are within normal limits. Again noted is a compression fracture in the mid thoracic spine. No large pleural effusions. IMPRESSION: Chronic hyperinflation with new left basilar densities. Findings could represent atelectasis. Electronically Signed   By: Richarda Overlie M.D.   On: 11/12/2016 18:56   Ct Abdomen Pelvis W Contrast  Result Date: 11/12/2016 CLINICAL DATA:  56 year old male with history of hematochezia at noon  today. Abdominal pain for the past several days, worsening today. History of bleeding ulcer. EXAM: CT ABDOMEN AND PELVIS WITH CONTRAST TECHNIQUE: Multidetector CT imaging of the abdomen and pelvis was performed using the standard protocol following bolus administration of intravenous contrast. CONTRAST:  ISOVUE-300 IOPAMIDOL (ISOVUE-300) INJECTION 61% COMPARISON:  CT the abdomen and pelvis 10/29/2012. FINDINGS: Lower chest: Scattered areas of mild cylindrical bronchiectasis are noted in the visualize lung bases, similar to prior studies. Hepatobiliary: No cystic or solid hepatic lesions. No intra or extrahepatic biliary ductal dilatation. Gallbladder  is unremarkable in appearance. Pancreas: No pancreatic mass. No pancreatic ductal dilatation. No pancreatic or peripancreatic fluid or inflammatory changes. Spleen: Status post splenectomy, with a large splenule immediately medial to the left kidney, similar to the prior study. Adrenals/Urinary Tract: Bilateral kidneys and bilateral adrenal glands are normal in appearance. No hydroureteronephrosis. Urinary bladder is unremarkable in appearance (partially obscured by beam hardening artifact from the patient's right hip arthroplasty). Stomach/Bowel: The appearance of the stomach is normal. There is no pathologic dilatation of small bowel or colon. The appendix is not confidently identified and may be surgically absent. Regardless, there are no inflammatory changes noted adjacent to the cecum to suggest the presence of an acute appendicitis at this time. Vascular/Lymphatic: Aortic atherosclerosis, without evidence of aneurysm or dissection in the abdominal or pelvic vasculature. No lymphadenopathy noted in the abdomen or pelvis. Reproductive: Prostate gland seminal vesicles are unremarkable in appearance. Other: No significant volume of ascites.  No pneumoperitoneum. Musculoskeletal: Status post PLIF at L5-S1 with interbody graft at L5-S1 interspace. There are no  aggressive appearing lytic or blastic lesions noted in the visualized portions of the skeleton. Status post right hip arthroplasty. IMPRESSION: 1. No acute findings in the abdomen or pelvis to account for the patient's symptoms. 2. Scattered areas of cylindrical bronchiectasis are again noted in the lung bases bilaterally, chronic and similar to prior examinations. 3. Aortic atherosclerosis. 4. Additional incidental findings, as above. Electronically Signed   By: Trudie Reed M.D.   On: 11/12/2016 18:41    Time Spent in minutes  30   SINGH,PRASHANT K M.D on 11/14/2016 at 9:07 AM  Between 7am to 7pm - Pager - [phone]  After 7pm go to [email or web address] - password [facility]  [facility] -  Office  [phone]

## 2016-11-14 NOTE — Progress Notes (Signed)
Texted Dr. Thedore MinsSingh about patient's heart rate being in the 130s-140s. PRN Ativan was given to patient per CIWA scale and increased agitation.

## 2016-11-14 NOTE — Progress Notes (Signed)
Obtained consent by patient's mother for EGD procedure via telephone with Shana Chuterystal M., Rn. Consent in chart.

## 2016-11-15 ENCOUNTER — Inpatient Hospital Stay (HOSPITAL_COMMUNITY): Payer: Medicare HMO

## 2016-11-15 LAB — COMPREHENSIVE METABOLIC PANEL
ALK PHOS: 64 U/L (ref 38–126)
ALT: 7 U/L — AB (ref 17–63)
AST: 19 U/L (ref 15–41)
Albumin: 2.9 g/dL — ABNORMAL LOW (ref 3.5–5.0)
Anion gap: 5 (ref 5–15)
BUN: 10 mg/dL (ref 6–20)
CALCIUM: 8.2 mg/dL — AB (ref 8.9–10.3)
CHLORIDE: 104 mmol/L (ref 101–111)
CO2: 23 mmol/L (ref 22–32)
CREATININE: 0.75 mg/dL (ref 0.61–1.24)
Glucose, Bld: 80 mg/dL (ref 65–99)
Potassium: 3.7 mmol/L (ref 3.5–5.1)
Sodium: 132 mmol/L — ABNORMAL LOW (ref 135–145)
Total Bilirubin: 0.5 mg/dL (ref 0.3–1.2)
Total Protein: 6.4 g/dL — ABNORMAL LOW (ref 6.5–8.1)

## 2016-11-15 LAB — CBC
HCT: 31 % — ABNORMAL LOW (ref 39.0–52.0)
Hemoglobin: 10.3 g/dL — ABNORMAL LOW (ref 13.0–17.0)
MCH: 26.8 pg (ref 26.0–34.0)
MCHC: 33.2 g/dL (ref 30.0–36.0)
MCV: 80.5 fL (ref 78.0–100.0)
PLATELETS: 473 10*3/uL — AB (ref 150–400)
RBC: 3.85 MIL/uL — AB (ref 4.22–5.81)
RDW: 18.7 % — AB (ref 11.5–15.5)
WBC: 13.8 10*3/uL — AB (ref 4.0–10.5)

## 2016-11-15 MED ORDER — LORAZEPAM 2 MG/ML IJ SOLN
1.0000 mg | Freq: Once | INTRAMUSCULAR | Status: AC
  Administered 2016-11-15: 1 mg via INTRAVENOUS
  Filled 2016-11-15: qty 1

## 2016-11-15 NOTE — Progress Notes (Signed)
REVIEWED. CONTINUE TO MONITOR SYMPTOMS. Pt not a candidate for sedation at this time unless evidence for active gi bleeding.  Patient chart reviewed. Spoke to nursing staff. The patient remains in delirium tremens with acute confusion. He is in the bed with protective mitts on speaking nonsensical. Remains on CIWA protocol. No hematochezia or melena noted. Hemoglobin is stable in the mid 10 range. No further acute GI bleeding noted. We will continue to monitor and consider upper endoscopy for likely upper GI bleed when he stabilizes from an alcohol withdrawal standpoint.   Thank you for allowing us to participate in the care of Raymond Fox  Wynne DustEric Gill, DNP, AGNP-C Adult & Gerontological Nurse Practitioner [facility]

## 2016-11-15 NOTE — Progress Notes (Signed)
PROGRESS NOTE                                                                                                                                                                                                             Patient Demographics:    Raymond Fox, is a 56 y.o. male, DOB - [birth year], WJX:[id number]  Admit date - 11/12/2016   Admitting Physician Haydee Monica, MD  Outpatient Primary MD for the patient is MUSE,ROCHELLE D., PA-C  LOS - 3  Chief Complaint  Patient presents with  . Rectal Bleeding       Brief Narrative   ILIYA Fox is a 56 y.o. male with medical history significant of etoh abuse, gastric ulcer, duodenal ulcer, asthma, polysubstance abuse comes in with 2 days of epigastric abdominal pain and what started off as melana, then progressed to [facility] colored stool, then he started getting nauseated and vomiting dark reddish vomit today.  He denies any fevers.  His last drink was yesterday.  Pt is very nauseated and is coughing a lot.  He has a h/o seizures before I presume from etoh withdrawal.  Pt referred for admission for gib.  Pt had egd in 2014 when he had a bleeding ulcer, at that time had no varices.  Dr fields called with GI and is aware of pt, advised starting an octreotide drip in addition to protonix drip   Subjective:    [name] today Is sleepy, denies any headache chest or abdominal pain.   Assessment  & Plan :     1.Upper GI bleed. History of peptic ulcer disease in the past. Did develop blood loss related acute anemia, was transfused 2 units of packed RBC, now stable H&H, for now continue IV PPI , GI on board will likely require an EGD sometime this admission.    2. Alcohol abuse with DTs. Schedule Librium and CIWA protocol.  3. High blood pressure. Likely due to #2 above. Placed on Catapres and as needed hydralazine.  4. Smoking. Counseled to quit. Neck or down  patch.  5. Left basilar atelectasis. Add IS and flutter valve.   No signs of infection Except mild reactionary leukocytosis, he remains afebrile, no cough or shortness of breath. Hold antibiotics for now and monitor. He does have history of splenectomy in the past.  Family Communication  :  None  Code Status :  Full  Diet : DIET DYS 2 Room service appropriate? No; Fluid consistency: Nectar Thick   Disposition Plan  :  Stepdown  Consults  :  GI  Procedures  :    DVT Prophylaxis  :  SCDs   Lab Results  Component Value Date   PLT 473 (H) 11/15/2016    Inpatient Medications  Scheduled Meds: . chlordiazePOXIDE  10 mg Oral TID  . cloNIDine  0.1 mg Oral TID  . folic acid  1 mg Oral Daily  . Influenza vac split quadrivalent PF  0.5 mL Intramuscular Tomorrow-1000  . multivitamin with minerals  1 tablet Oral Daily  . nicotine  21 mg Transdermal Daily  . thiamine  100 mg Oral Daily   Or  . thiamine  100 mg Intravenous Daily   Continuous Infusions: . 0.9 % NaCl with KCl 20 mEq / L 100 mL/hr at 11/15/16 0600  . pantoprozole (PROTONIX) infusion 8 mg/hr (11/15/16 0600)   PRN Meds:.hydrALAZINE, LORazepam **OR** LORazepam, [DISCONTINUED] ondansetron **OR** ondansetron (ZOFRAN) IV  Antibiotics  :    Anti-infectives    Start     Dose/Rate Route Frequency Ordered Stop   11/14/16 0915  cefTRIAXone (ROCEPHIN) 1 g in dextrose 5 % 50 mL IVPB  Status:  Discontinued     1 g 100 mL/hr over 30 Minutes Intravenous Every 24 hours 11/14/16 0906 11/14/16 0906   11/12/16 2200  piperacillin-tazobactam (ZOSYN) IVPB 3.375 g  Status:  Discontinued     3.375 g 12.5 mL/hr over 240 Minutes Intravenous Every 8 hours 11/12/16 2150 11/14/16 0906         Objective:   Vitals:   11/15/16 0400 11/15/16 0413 11/15/16 0450 11/15/16 0827  BP:  (!) 165/102    Pulse:  94    Resp:  (!) 26    Temp: 97.7 F (36.5 C)     TempSrc: Axillary     SpO2:  96%  93%  Weight:   53.7 kg (118 lb 6.2 oz)    Height:        Wt Readings from Last 3 Encounters:  11/15/16 53.7 kg (118 lb 6.2 oz)  12/07/12 61.1 kg (134 lb 9.6 oz)  11/02/12 60 kg (132 lb 4.4 oz)     Intake/Output Summary (Last 24 hours) at 11/15/16 0927 Last data filed at 11/15/16 0600  Gross per 24 hour  Intake          3033.33 ml  Output             1000 ml  Net          2033.33 ml     Physical Exam  Awake but confused, in DTs, No new F.N deficits,  [place].AT,PERRAL Supple Neck,No JVD, No cervical lymphadenopathy appriciated.  Symmetrical Chest wall movement, Good air movement bilaterally, CTAB RRR,No Gallops,Rubs or new Murmurs, No Parasternal Heave +ve B.Sounds, Abd Soft, No tenderness, No organomegaly appriciated, No rebound - guarding or rigidity. No Cyanosis, Clubbing or edema, No new Rash or bruise       Data Review:    CBC  Recent Labs Lab 11/12/16 1556  11/13/16 0624 11/13/16 1016 11/13/16 1510 11/13/16 1818 11/14/16 0414 11/15/16 0436  WBC 21.8*  --  15.0*  --   --   --  12.9* 13.8*  HGB 9.7*  < > 10.1* 10.1* 10.5* 8.9* 10.5* 10.3*  HCT 30.0*  --  30.1* 30.4* 31.3* 26.5*  31.2* 31.0*  PLT 511*  --  368  --   --   --  440* 473*  MCV 79.8  --  80.3  --   --   --  80.4 80.5  MCH 25.8*  --  26.9  --   --   --  27.1 26.8  MCHC 32.3  --  33.6  --   --   --  33.7 33.2  RDW 18.3*  --  18.1*  --   --   --  18.7* 18.7*  < > = values in this interval not displayed.  Chemistries   Recent Labs Lab 11/12/16 1556 11/13/16 0624 11/13/16 1510 11/14/16 0414 11/15/16 0436  NA 131* 134* 132* 131* 132*  K 4.3 5.1 4.0 3.8 3.7  CL 97* 102 99* 98* 104  CO2 24 27 26 26 23   GLUCOSE 116* 123* 95 76 80  BUN 41* 25* 15 11 10   CREATININE 0.99 0.95 0.83 0.87 0.75  CALCIUM 9.1 8.2* 8.4* 8.5* 8.2*  MG  --   --   --  1.8  --   AST 16 17 19 17 19   ALT 9* 8* 9* 7* 7*  ALKPHOS 80 65 69 70 64  BILITOT 0.4 1.4* 1.0 0.8 0.5    ------------------------------------------------------------------------------------------------------------------ No results for input(s): CHOL, HDL, LDLCALC, TRIG, CHOLHDL, LDLDIRECT in the last 72 hours.  No results found for: HGBA1C ------------------------------------------------------------------------------------------------------------------ No results for input(s): TSH, T4TOTAL, T3FREE, THYROIDAB in the last 72 hours.  Invalid input(s): FREET3 ------------------------------------------------------------------------------------------------------------------  Recent Labs  11/12/16 1556 11/13/16 0624  VITAMINB12 263  --   FOLATE  --  15.3  FERRITIN 35  --   TIBC 314  --   IRON 32*  --   RETICCTPCT 1.7  --     Coagulation profile  Recent Labs Lab 11/12/16 1918  INR 0.97    No results for input(s): DDIMER in the last 72 hours.  Cardiac Enzymes  Recent Labs Lab 11/12/16 1629  TROPONINI <0.03   ------------------------------------------------------------------------------------------------------------------ No results found for: BNP  Micro Results Recent Results (from the past 240 hour(s))  MRSA PCR Screening     Status: None   Collection Time: 11/12/16  9:27 PM  Result Value Ref Range Status   MRSA by PCR NEGATIVE NEGATIVE Final    Comment:        The GeneXpert MRSA Assay (FDA approved for NASAL specimens only), is one component of a comprehensive MRSA colonization surveillance program. It is not intended to diagnose MRSA infection nor to guide or monitor treatment for MRSA infections.     Radiology Reports Dg Chest 2 View  Result Date: 11/12/2016 CLINICAL DATA:  Bright red blood in stool.  Abdominal pain. EXAM: CHEST  2 VIEW COMPARISON:  02/04/2016 FINDINGS: Multiple old left rib fractures. New densities at the left costophrenic angle. Evidence for lung hyperinflation. Heart and mediastinum are within normal limits. Again noted is a compression  fracture in the mid thoracic spine. No large pleural effusions. IMPRESSION: Chronic hyperinflation with new left basilar densities. Findings could represent atelectasis. Electronically Signed   By: Richarda OverlieAdam  Henn M.D.   On: 11/12/2016 18:56   Ct Abdomen Pelvis W Contrast  Result Date: 11/12/2016 CLINICAL DATA:  56 year old male with history of hematochezia at noon today. Abdominal pain for the past several days, worsening today. History of bleeding ulcer. EXAM: CT ABDOMEN AND PELVIS WITH CONTRAST TECHNIQUE: Multidetector CT imaging of the abdomen and pelvis was performed using the standard protocol  following bolus administration of intravenous contrast. CONTRAST:  ISOVUE-300 IOPAMIDOL (ISOVUE-300) INJECTION 61% COMPARISON:  CT the abdomen and pelvis 10/29/2012. FINDINGS: Lower chest: Scattered areas of mild cylindrical bronchiectasis are noted in the visualize lung bases, similar to prior studies. Hepatobiliary: No cystic or solid hepatic lesions. No intra or extrahepatic biliary ductal dilatation. Gallbladder is unremarkable in appearance. Pancreas: No pancreatic mass. No pancreatic ductal dilatation. No pancreatic or peripancreatic fluid or inflammatory changes. Spleen: Status post splenectomy, with a large splenule immediately medial to the left kidney, similar to the prior study. Adrenals/Urinary Tract: Bilateral kidneys and bilateral adrenal glands are normal in appearance. No hydroureteronephrosis. Urinary bladder is unremarkable in appearance (partially obscured by beam hardening artifact from the patient's right hip arthroplasty). Stomach/Bowel: The appearance of the stomach is normal. There is no pathologic dilatation of small bowel or colon. The appendix is not confidently identified and may be surgically absent. Regardless, there are no inflammatory changes noted adjacent to the cecum to suggest the presence of an acute appendicitis at this time. Vascular/Lymphatic: Aortic atherosclerosis, without  evidence of aneurysm or dissection in the abdominal or pelvic vasculature. No lymphadenopathy noted in the abdomen or pelvis. Reproductive: Prostate gland seminal vesicles are unremarkable in appearance. Other: No significant volume of ascites.  No pneumoperitoneum. Musculoskeletal: Status post PLIF at L5-S1 with interbody graft at L5-S1 interspace. There are no aggressive appearing lytic or blastic lesions noted in the visualized portions of the skeleton. Status post right hip arthroplasty. IMPRESSION: 1. No acute findings in the abdomen or pelvis to account for the patient's symptoms. 2. Scattered areas of cylindrical bronchiectasis are again noted in the lung bases bilaterally, chronic and similar to prior examinations. 3. Aortic atherosclerosis. 4. Additional incidental findings, as above. Electronically Signed   By: Trudie Reed M.D.   On: 11/12/2016 18:41   Dg Chest Port 1 View  Result Date: 11/15/2016 CLINICAL DATA:  Shortness of Breath, asthma, hypertension. EXAM: PORTABLE CHEST 1 VIEW COMPARISON:  11/12/2016 FINDINGS: There is hyperinflation of the lungs compatible with COPD. Left basilar atelectasis or infiltrate again noted, unchanged. Right lung is clear. Heart is normal size. No visible effusion or acute bony abnormality. IMPRESSION: Hyperinflation/COPD.  Continued left base atelectasis or infiltrate. Electronically Signed   By: Charlett Nose M.D.   On: 11/15/2016 09:19    Time Spent in minutes  30   Maycel Riffe K M.D on 11/15/2016 at 9:27 AM  Between 7am to 7pm - Pager - [phone]  After 7pm go to [email or web address] - password [facility]  [facility] -  Office  [phone]

## 2016-11-16 LAB — CBC
HCT: 32.1 % — ABNORMAL LOW (ref 39.0–52.0)
Hemoglobin: 10.7 g/dL — ABNORMAL LOW (ref 13.0–17.0)
MCH: 27.1 pg (ref 26.0–34.0)
MCHC: 33.3 g/dL (ref 30.0–36.0)
MCV: 81.3 fL (ref 78.0–100.0)
PLATELETS: 506 10*3/uL — AB (ref 150–400)
RBC: 3.95 MIL/uL — ABNORMAL LOW (ref 4.22–5.81)
RDW: 19.2 % — ABNORMAL HIGH (ref 11.5–15.5)
WBC: 11.7 10*3/uL — ABNORMAL HIGH (ref 4.0–10.5)

## 2016-11-16 LAB — COMPREHENSIVE METABOLIC PANEL
ALBUMIN: 3 g/dL — AB (ref 3.5–5.0)
ALK PHOS: 64 U/L (ref 38–126)
ALT: 8 U/L — AB (ref 17–63)
AST: 20 U/L (ref 15–41)
Anion gap: 7 (ref 5–15)
BUN: 10 mg/dL (ref 6–20)
CALCIUM: 8.4 mg/dL — AB (ref 8.9–10.3)
CO2: 23 mmol/L (ref 22–32)
CREATININE: 0.73 mg/dL (ref 0.61–1.24)
Chloride: 104 mmol/L (ref 101–111)
GFR calc Af Amer: >60 mL/min (ref >60)
GFR calc non Af Amer: >60 mL/min (ref >60)
GLUCOSE: 81 mg/dL (ref 65–99)
Potassium: 3.7 mmol/L (ref 3.5–5.1)
SODIUM: 134 mmol/L — AB (ref 135–145)
Total Bilirubin: 0.4 mg/dL (ref 0.3–1.2)
Total Protein: 6.7 g/dL (ref 6.5–8.1)

## 2016-11-16 MED ORDER — LORAZEPAM 2 MG/ML IJ SOLN
2.0000 mg | Freq: Four times a day (QID) | INTRAMUSCULAR | Status: DC
  Administered 2016-11-16: 2 mg via INTRAVENOUS
  Filled 2016-11-16: qty 1

## 2016-11-16 MED ORDER — MAGNESIUM SULFATE IN D5W 1-5 GM/100ML-% IV SOLN
1.0000 g | Freq: Once | INTRAVENOUS | Status: AC
  Administered 2016-11-16: 1 g via INTRAVENOUS
  Filled 2016-11-16: qty 100

## 2016-11-16 MED ORDER — CHLORDIAZEPOXIDE HCL 5 MG PO CAPS
5.0000 mg | ORAL_CAPSULE | Freq: Three times a day (TID) | ORAL | Status: DC
  Administered 2016-11-16 (×3): 5 mg via ORAL
  Filled 2016-11-16 (×3): qty 1

## 2016-11-16 MED ORDER — LORAZEPAM 2 MG/ML IJ SOLN: 2.0000 mg | Freq: Four times a day (QID) | INTRAMUSCULAR | Status: DC | PRN

## 2016-11-16 NOTE — Progress Notes (Signed)
PROGRESS NOTE                                                                                                                                                                                                             Patient Demographics:    Raymond Fox, is a 56 y.o. male, DOB - Jul 08, 1961, ZOX:[id number]  Admit date - 11/12/2016   Admitting Physician Haydee Monica, MD  Outpatient Primary MD for the patient is MUSE,ROCHELLE D., PA-C  LOS - 4  Chief Complaint  Patient presents with  . Rectal Bleeding       Brief Narrative   Raymond Fox is a 56 y.o. male with medical history significant of etoh abuse, gastric ulcer, duodenal ulcer, asthma, polysubstance abuse comes in with 2 days of epigastric abdominal pain and what started off as melana, then progressed to [facility] colored stool, then he started getting nauseated and vomiting dark reddish vomit today.  He denies any fevers.  His last drink was yesterday.  Pt is very nauseated and is coughing a lot.  He has a h/o seizures before I presume from etoh withdrawal.  Pt referred for admission for gib.  Pt had egd in 2014 when he had a bleeding ulcer, at that time had no varices.  Dr fields called with GI and is aware of pt, advised starting an octreotide drip in addition to protonix drip   Subjective:    Raymond Fox today Is Strongly somnolent, unable to answer questions.   Assessment  & Plan :     1.Upper GI bleed. History of peptic ulcer disease in the past. Did develop blood loss related acute anemia, was transfused 2 units of packed RBC, now stable H&H, for now continue IV PPI , GI on board will likely require an EGD sometime this admission.    2. Alcohol abuse with DTs. Very somnolent morning of 11/16/2016, he is clearly overmedicated, reduce Librium, stop all Ativan until he shows signs of agitation if he does low-dose Ativan, continue thiamine and folic acid  supplementation.  3. High blood pressure. Likely due to #2 above. Placed on Catapres and as needed hydralazine.  4. Smoking. Counseled to quit. Neck or down patch.  5. Left basilar atelectasis. Added IS and flutter valve.   No signs of infection Except mild reactionary leukocytosis, he remains afebrile,  no cough or shortness of breath. Hold antibiotics for now and monitor. He does have history of splenectomy in the past.    Family Communication  :  None  Code Status :  Full  Diet : DIET DYS 2 Room service appropriate? No; Fluid consistency: Nectar Thick   Disposition Plan  :  Stepdown  Consults  :  GI  Procedures  :    DVT Prophylaxis  :  SCDs   Lab Results  Component Value Date   PLT 506 (H) 11/16/2016    Inpatient Medications  Scheduled Meds: . chlordiazePOXIDE  5 mg Oral TID  . cloNIDine  0.1 mg Oral TID  . folic acid  1 mg Oral Daily  . Influenza vac split quadrivalent PF  0.5 mL Intramuscular Tomorrow-1000  . magnesium sulfate 1 - 4 g bolus IVPB  1 g Intravenous Once  . multivitamin with minerals  1 tablet Oral Daily  . nicotine  21 mg Transdermal Daily  . thiamine  100 mg Oral Daily   Or  . thiamine  100 mg Intravenous Daily   Continuous Infusions: . 0.9 % NaCl with KCl 20 mEq / L 100 mL/hr at 11/16/16 0300   PRN Meds:.hydrALAZINE, [DISCONTINUED] ondansetron **OR** ondansetron (ZOFRAN) IV  Antibiotics  :    Anti-infectives    Start     Dose/Rate Route Frequency Ordered Stop   11/14/16 0915  cefTRIAXone (ROCEPHIN) 1 g in dextrose 5 % 50 mL IVPB  Status:  Discontinued     1 g 100 mL/hr over 30 Minutes Intravenous Every 24 hours 11/14/16 0906 11/14/16 0906   11/12/16 2200  piperacillin-tazobactam (ZOSYN) IVPB 3.375 g  Status:  Discontinued     3.375 g 12.5 mL/hr over 240 Minutes Intravenous Every 8 hours 11/12/16 2150 11/14/16 0906         Objective:   Vitals:   11/15/16 2343 11/16/16 0200 11/16/16 0400 11/16/16 0600  BP: (!) 156/91 (!) 165/100  138/79 (!) 122/93  Pulse: (!) 109 (!) 112 100 80  Resp: (!) 26 (!) 32 (!) 27 20  Temp:   97.7 F (36.5 C)   TempSrc:   Axillary   SpO2: 92% 96% 96% 98%  Weight:   52.1 kg (114 lb 13.8 oz)   Height:        Wt Readings from Last 3 Encounters:  11/16/16 52.1 kg (114 lb 13.8 oz)  12/07/12 61.1 kg (134 lb 9.6 oz)  11/02/12 60 kg (132 lb 4.4 oz)     Intake/Output Summary (Last 24 hours) at 11/16/16 0830 Last data filed at 11/16/16 0400  Gross per 24 hour  Intake             2740 ml  Output             3850 ml  Net            -1110 ml     Physical Exam  Awake but confused, in DTs, No new F.N deficits,  [place].AT,PERRAL Supple Neck,No JVD, No cervical lymphadenopathy appriciated.  Symmetrical Chest wall movement, Good air movement bilaterally, CTAB RRR,No Gallops,Rubs or new Murmurs, No Parasternal Heave +ve B.Sounds, Abd Soft, No tenderness, No organomegaly appriciated, No rebound - guarding or rigidity. No Cyanosis, Clubbing or edema, No new Rash or bruise       Data Review:    CBC  Recent Labs Lab 11/12/16 1556  11/13/16 [id number]  11/13/16 1510 11/13/16 1818 11/14/16 0414 11/15/16 0436 11/16/16 0518  WBC 21.8*  --  15.0*  --   --   --  12.9* 13.8* 11.7*  HGB 9.7*  < > 10.1*  < > 10.5* 8.9* 10.5* 10.3* 10.7*  HCT 30.0*  --  30.1*  < > 31.3* 26.5* 31.2* 31.0* 32.1*  PLT 511*  --  368  --   --   --  440* 473* 506*  MCV 79.8  --  80.3  --   --   --  80.4 80.5 81.3  MCH 25.8*  --  26.9  --   --   --  27.1 26.8 27.1  MCHC 32.3  --  33.6  --   --   --  33.7 33.2 33.3  RDW 18.3*  --  18.1*  --   --   --  18.7* 18.7* 19.2*  < > = values in this interval not displayed.  Chemistries   Recent Labs Lab 11/13/16 0624 11/13/16 1510 11/14/16 0414 11/15/16 0436 11/16/16 0518  NA 134* 132* 131* 132* 134*  K 5.1 4.0 3.8 3.7 3.7  CL 102 99* 98* 104 104  CO2 27 26 26 23 23   GLUCOSE 123* 95 76 80 81  BUN 25* 15 11 10 10   CREATININE 0.95 0.83 0.87 0.75 0.73  CALCIUM 8.2*  8.4* 8.5* 8.2* 8.4*  MG  --   --  1.8  --   --   AST 17 19 17 19 20   ALT 8* 9* 7* 7* 8*  ALKPHOS 65 69 70 64 64  BILITOT 1.4* 1.0 0.8 0.5 0.4   ------------------------------------------------------------------------------------------------------------------ No results for input(s): CHOL, HDL, LDLCALC, TRIG, CHOLHDL, LDLDIRECT in the last 72 hours.  No results found for: HGBA1C ------------------------------------------------------------------------------------------------------------------ No results for input(s): TSH, T4TOTAL, T3FREE, THYROIDAB in the last 72 hours.  Invalid input(s): FREET3 ------------------------------------------------------------------------------------------------------------------ No results for input(s): VITAMINB12, FOLATE, FERRITIN, TIBC, IRON, RETICCTPCT in the last 72 hours.  Coagulation profile  Recent Labs Lab 11/12/16 1918  INR 0.97    No results for input(s): DDIMER in the last 72 hours.  Cardiac Enzymes  Recent Labs Lab 11/12/16 1629  TROPONINI <0.03   ------------------------------------------------------------------------------------------------------------------ No results found for: BNP  Micro Results Recent Results (from the past 240 hour(s))  MRSA PCR Screening     Status: None   Collection Time: 11/12/16  9:27 PM  Result Value Ref Range Status   MRSA by PCR NEGATIVE NEGATIVE Final    Comment:        The GeneXpert MRSA Assay (FDA approved for NASAL specimens only), is one component of a comprehensive MRSA colonization surveillance program. It is not intended to diagnose MRSA infection nor to guide or monitor treatment for MRSA infections.     Radiology Reports Dg Chest 2 View  Result Date: 11/12/2016 CLINICAL DATA:  Bright red blood in stool.  Abdominal pain. EXAM: CHEST  2 VIEW COMPARISON:  02/04/2016 FINDINGS: Multiple old left rib fractures. New densities at the left costophrenic angle. Evidence for lung  hyperinflation. Heart and mediastinum are within normal limits. Again noted is a compression fracture in the mid thoracic spine. No large pleural effusions. IMPRESSION: Chronic hyperinflation with new left basilar densities. Findings could represent atelectasis. Electronically Signed   By: Richarda Overlie M.D.   On: 11/12/2016 18:56   Ct Abdomen Pelvis W Contrast  Result Date: 11/12/2016 CLINICAL DATA:  56 year old male with history of hematochezia at noon today. Abdominal pain for the past several days, worsening today. History of bleeding ulcer. EXAM: CT  ABDOMEN AND PELVIS WITH CONTRAST TECHNIQUE: Multidetector CT imaging of the abdomen and pelvis was performed using the standard protocol following bolus administration of intravenous contrast. CONTRAST:  ISOVUE-300 IOPAMIDOL (ISOVUE-300) INJECTION 61% COMPARISON:  CT the abdomen and pelvis 10/29/2012. FINDINGS: Lower chest: Scattered areas of mild cylindrical bronchiectasis are noted in the visualize lung bases, similar to prior studies. Hepatobiliary: No cystic or solid hepatic lesions. No intra or extrahepatic biliary ductal dilatation. Gallbladder is unremarkable in appearance. Pancreas: No pancreatic mass. No pancreatic ductal dilatation. No pancreatic or peripancreatic fluid or inflammatory changes. Spleen: Status post splenectomy, with a large splenule immediately medial to the left kidney, similar to the prior study. Adrenals/Urinary Tract: Bilateral kidneys and bilateral adrenal glands are normal in appearance. No hydroureteronephrosis. Urinary bladder is unremarkable in appearance (partially obscured by beam hardening artifact from the patient's right hip arthroplasty). Stomach/Bowel: The appearance of the stomach is normal. There is no pathologic dilatation of small bowel or colon. The appendix is not confidently identified and may be surgically absent. Regardless, there are no inflammatory changes noted adjacent to the cecum to suggest the presence  of an acute appendicitis at this time. Vascular/Lymphatic: Aortic atherosclerosis, without evidence of aneurysm or dissection in the abdominal or pelvic vasculature. No lymphadenopathy noted in the abdomen or pelvis. Reproductive: Prostate gland seminal vesicles are unremarkable in appearance. Other: No significant volume of ascites.  No pneumoperitoneum. Musculoskeletal: Status post PLIF at L5-S1 with interbody graft at L5-S1 interspace. There are no aggressive appearing lytic or blastic lesions noted in the visualized portions of the skeleton. Status post right hip arthroplasty. IMPRESSION: 1. No acute findings in the abdomen or pelvis to account for the patient's symptoms. 2. Scattered areas of cylindrical bronchiectasis are again noted in the lung bases bilaterally, chronic and similar to prior examinations. 3. Aortic atherosclerosis. 4. Additional incidental findings, as above. Electronically Signed   By: Trudie Reed M.D.   On: 11/12/2016 18:41   Dg Chest Port 1 View  Result Date: 11/15/2016 CLINICAL DATA:  Shortness of Breath, asthma, hypertension. EXAM: PORTABLE CHEST 1 VIEW COMPARISON:  11/12/2016 FINDINGS: There is hyperinflation of the lungs compatible with COPD. Left basilar atelectasis or infiltrate again noted, unchanged. Right lung is clear. Heart is normal size. No visible effusion or acute bony abnormality. IMPRESSION: Hyperinflation/COPD.  Continued left base atelectasis or infiltrate. Electronically Signed   By: Charlett Nose M.D.   On: 11/15/2016 09:19    Time Spent in minutes  30   [place] Paiz K M.D on 11/16/2016 at 8:30 AM  Between 7am to 7pm - Pager - [phone]  After 7pm go to [email or web address] - password [facility]  [facility] -  Office  [phone]

## 2016-11-16 NOTE — Progress Notes (Signed)
Patient is somnolent. Remains on CIWA protocol  No further bleeding. Discussed discussed with nursing staff.    Vital signs in last 24 hours: Temp:  [97.4 F (36.3 C)-98.7 F (37.1 C)] 97.8 F (36.6 C) (01/27 1157) Pulse Rate:  [80-152] 96 (01/27 0800) Resp:  [18-32] 25 (01/27 0800) BP: (117-175)/(79-112) 130/95 (01/27 0800) SpO2:  [74 %-100 %] 94 % (01/27 0800) Weight:  [114 lb 13.8 oz (52.1 kg)] 114 lb 13.8 oz (52.1 kg) (01/27 0400) Last BM Date: 11/12/16 Abdomen: Nondistended. Positive bowel sounds. Abdomen is soft obvious mass or organomegaly Extremities:  Without clubbing or edema.    Intake/Output from previous day: 01/26 0701 - 01/27 0700 In: 2740 [P.O.:340; I.V.:2400] Out: 3850 [Urine:3850] Intake/Output this shift: Total I/O In: 600 [I.V.:600] Out: 200 [Urine:200]  Lab Results:  Recent Labs  11/14/16 0414 11/15/16 0436 11/16/16 0518  WBC 12.9* 13.8* 11.7*  HGB 10.5* 10.3* 10.7*  HCT 31.2* 31.0* 32.1*  PLT 440* 473* 506*   BMET  Recent Labs  11/14/16 0414 11/15/16 0436 11/16/16 0518  NA 131* 132* 134*  K 3.8 3.7 3.7  CL 98* 104 104  CO2 26 23 23   GLUCOSE 76 80 81  BUN 11 10 10   CREATININE 0.87 0.75 0.73  CALCIUM 8.5* 8.2* 8.4*   LFT  Recent Labs  11/16/16 0518  PROT 6.7  ALBUMIN 3.0*  AST 20  ALT 8*  ALKPHOS 64  BILITOT 0.4   PT/INR No results for input(s): LABPROT, INR in the last 72 hours. Hepatitis Panel No results for input(s): HEPBSAG, HCVAB, HEPAIGM, HEPBIGM in the last 72 hours. C-Diff No results for input(s): CDIFFTOX in the last 72 hours.  Studies/Results: Dg Chest Port 1 View  Result Date: 11/15/2016 CLINICAL DATA:  Shortness of Breath, asthma, hypertension. EXAM: PORTABLE CHEST 1 VIEW COMPARISON:  11/12/2016 FINDINGS: There is hyperinflation of the lungs compatible with COPD. Left basilar atelectasis or infiltrate again noted, unchanged. Right lung is clear. Heart is normal size. No visible effusion or acute bony  abnormality. IMPRESSION: Hyperinflation/COPD.  Continued left base atelectasis or infiltrate. Electronically Signed   By: Charlett NoseKevin  Dover M.D.   On: 11/15/2016 09:19    Impression:  Upper GI bleed in an alcoholic male with the DTs. He remains hemodynamically stable. Hemoglobin stable. Clinically, no further bleeding.  Recommendations:  We'll reassess January 29.  EGD prior to discharge.

## 2016-11-17 DIAGNOSIS — K250 Acute gastric ulcer with hemorrhage: Secondary | ICD-10-CM

## 2016-11-17 LAB — COMPREHENSIVE METABOLIC PANEL
ALT: 8 U/L — AB (ref 17–63)
AST: 21 U/L (ref 15–41)
Albumin: 3.3 g/dL — ABNORMAL LOW (ref 3.5–5.0)
Alkaline Phosphatase: 69 U/L (ref 38–126)
Anion gap: 8 (ref 5–15)
BILIRUBIN TOTAL: 0.3 mg/dL (ref 0.3–1.2)
BUN: 7 mg/dL (ref 6–20)
CHLORIDE: 103 mmol/L (ref 101–111)
CO2: 23 mmol/L (ref 22–32)
CREATININE: 0.76 mg/dL (ref 0.61–1.24)
Calcium: 8.8 mg/dL — ABNORMAL LOW (ref 8.9–10.3)
GFR calc Af Amer: >60 mL/min (ref >60)
Glucose, Bld: 83 mg/dL (ref 65–99)
Potassium: 4.1 mmol/L (ref 3.5–5.1)
Sodium: 134 mmol/L — ABNORMAL LOW (ref 135–145)
TOTAL PROTEIN: 7.1 g/dL (ref 6.5–8.1)

## 2016-11-17 LAB — CBC
HCT: 32.6 % — ABNORMAL LOW (ref 39.0–52.0)
Hemoglobin: 10.8 g/dL — ABNORMAL LOW (ref 13.0–17.0)
MCH: 26.9 pg (ref 26.0–34.0)
MCHC: 33.1 g/dL (ref 30.0–36.0)
MCV: 81.1 fL (ref 78.0–100.0)
PLATELETS: 534 10*3/uL — AB (ref 150–400)
RBC: 4.02 MIL/uL — ABNORMAL LOW (ref 4.22–5.81)
RDW: 19.7 % — AB (ref 11.5–15.5)
WBC: 11.5 10*3/uL — AB (ref 4.0–10.5)

## 2016-11-17 MED ORDER — AMLODIPINE BESYLATE 5 MG PO TABS
10.0000 mg | ORAL_TABLET | Freq: Every day | ORAL | Status: DC
  Administered 2016-11-17 – 2016-11-21 (×5): 10 mg via ORAL
  Filled 2016-11-17 (×5): qty 2

## 2016-11-17 MED ORDER — METOPROLOL TARTRATE 25 MG PO TABS
25.0000 mg | ORAL_TABLET | Freq: Two times a day (BID) | ORAL | Status: DC
  Administered 2016-11-17 – 2016-11-18 (×4): 25 mg via ORAL
  Filled 2016-11-17 (×4): qty 1

## 2016-11-17 MED ORDER — CHLORDIAZEPOXIDE HCL 5 MG PO CAPS
10.0000 mg | ORAL_CAPSULE | Freq: Three times a day (TID) | ORAL | Status: DC
  Administered 2016-11-17 – 2016-11-18 (×6): 10 mg via ORAL
  Filled 2016-11-17 (×6): qty 2

## 2016-11-17 MED ORDER — METOPROLOL TARTRATE 5 MG/5ML IV SOLN: 5.0000 mg | INTRAVENOUS | Status: DC | PRN

## 2016-11-17 MED ORDER — LORAZEPAM 2 MG/ML IJ SOLN: 1.0000 mg | INTRAMUSCULAR | Status: DC | PRN

## 2016-11-17 NOTE — Progress Notes (Signed)
PT REMAINS CONFUSED ,BUT IS CALM AND COOPERATIVE. CONTINUES TO HALLUCINATE ABOUT PEOPLE BEING IN HIS ROOM.

## 2016-11-17 NOTE — Plan of Care (Signed)
Problem: Education: Goal: Knowledge of [facility] Education information/materials will improve Outcome: Not Progressing PT REMAINS CONFUSED AND DISORIENTED  Problem: Safety: Goal: Ability to remain free from injury will improve Outcome: Not Progressing PT REMAINS CONFUSED AND DISORIENTED

## 2016-11-17 NOTE — Progress Notes (Signed)
PROGRESS NOTE                                                                                                                                                                                                             Patient Demographics:    Raymond Fox, is a 56 y.o. male, DOB - 10-Aug-1961, WUJ:[id number]  Admit date - 11/12/2016   Admitting Physician Haydee Monica, MD  Outpatient Primary MD for the patient is MUSE,ROCHELLE D., PA-C  LOS - 5  Chief Complaint  Patient presents with  . Rectal Bleeding       Brief Narrative   Raymond Fox is a 56 y.o. male with medical history significant of etoh abuse, gastric ulcer, duodenal ulcer, asthma, polysubstance abuse comes in with 2 days of epigastric abdominal pain and what started off as melana, then progressed to [facility] colored stool, then he started getting nauseated and vomiting dark reddish vomit today.  He denies any fevers.  His last drink was yesterday.  Pt is very nauseated and is coughing a lot.  He has a h/o seizures before I presume from etoh withdrawal.  Pt referred for admission for gib.  Pt had egd in 2014 when he had a bleeding ulcer, at that time had no varices.  Dr fields called with GI and is aware of pt, advised starting an octreotide drip in addition to protonix drip   Subjective:    [name] today Is in bed, mildly sleepy, no headache no SOB, no abd pain.   Assessment  & Plan :     1.Upper GI bleed. History of peptic ulcer disease in the past. Did develop blood loss related acute anemia, was transfused 2 units of packed RBC, now stable H&H, for now continue IV PPI , GI on board will likely require an EGD sometime this admission.    2. Alcohol abuse with DTs. Stable on Librium, continue thiamine and folic acid supplementation. Ativan low dose only if needed.  3. High blood pressure. Likely due to #2 above. Placed on Catapres and Lopressor  and as needed hydralazine.  4. Smoking. Counseled to quit. Neck or down patch.  5. Left basilar atelectasis. Added IS and flutter valve.   No signs of infection Except mild reactionary leukocytosis, he remains afebrile, no cough or shortness of breath. Hold antibiotics for  now and monitor. He does have history of splenectomy in the past.    Family Communication  :  None  Code Status :  Full  Diet : DIET DYS 2 Room service appropriate? No; Fluid consistency: Nectar Thick Diet NPO time specified   Disposition Plan  :  Stepdown  Consults  :  GI  Procedures  :    DVT Prophylaxis  :  SCDs   Lab Results  Component Value Date   PLT 534 (H) 11/17/2016    Inpatient Medications  Scheduled Meds: . amLODipine  10 mg Oral Daily  . chlordiazePOXIDE  10 mg Oral TID  . cloNIDine  0.1 mg Oral TID  . folic acid  1 mg Oral Daily  . Influenza vac split quadrivalent PF  0.5 mL Intramuscular Tomorrow-1000  . metoprolol tartrate  25 mg Oral BID  . multivitamin with minerals  1 tablet Oral Daily  . nicotine  21 mg Transdermal Daily  . thiamine  100 mg Oral Daily   Or  . thiamine  100 mg Intravenous Daily   Continuous Infusions:  PRN Meds:.hydrALAZINE, metoprolol, [DISCONTINUED] ondansetron **OR** ondansetron (ZOFRAN) IV  Antibiotics  :    Anti-infectives    Start     Dose/Rate Route Frequency Ordered Stop   11/14/16 0915  cefTRIAXone (ROCEPHIN) 1 g in dextrose 5 % 50 mL IVPB  Status:  Discontinued     1 g 100 mL/hr over 30 Minutes Intravenous Every 24 hours 11/14/16 0906 11/14/16 0906   11/12/16 2200  piperacillin-tazobactam (ZOSYN) IVPB 3.375 g  Status:  Discontinued     3.375 g 12.5 mL/hr over 240 Minutes Intravenous Every 8 hours 11/12/16 2150 11/14/16 0906         Objective:   Vitals:   11/17/16 0500 11/17/16 0600 11/17/16 0700 11/17/16 0800  BP: (!) 156/111 (!) 163/107 (!) 159/103 (!) 159/108  Pulse: (!) 114 99 100 (!) 113  Resp: 15     Temp:    98.4 F (36.9 C)    TempSrc:    Oral  SpO2: 95% 95% 96% 95%  Weight: 52.1 kg (114 lb 13.8 oz)     Height:        Wt Readings from Last 3 Encounters:  11/17/16 52.1 kg (114 lb 13.8 oz)  12/07/12 61.1 kg (134 lb 9.6 oz)  11/02/12 60 kg (132 lb 4.4 oz)     Intake/Output Summary (Last 24 hours) at 11/17/16 1042 Last data filed at 11/17/16 0500  Gross per 24 hour  Intake              120 ml  Output             2000 ml  Net            -1880 ml     Physical Exam  Awake but confused, in DTs, No new F.N deficits,  Andrew.AT,PERRAL Supple Neck,No JVD, No cervical lymphadenopathy appriciated.  Symmetrical Chest wall movement, Good air movement bilaterally, CTAB RRR,No Gallops,Rubs or new Murmurs, No Parasternal Heave +ve B.Sounds, Abd Soft, No tenderness, No organomegaly appriciated, No rebound - guarding or rigidity. No Cyanosis, Clubbing or edema, No new Rash or bruise       Data Review:    CBC  Recent Labs Lab 11/13/16 0624  11/13/16 1818 11/14/16 0414 11/15/16 0436 11/16/16 0518 11/17/16 0625  WBC 15.0*  --   --  12.9* 13.8* 11.7* 11.5*  HGB 10.1*  < > 8.9* 10.5* 10.3*  10.7* 10.8*  HCT 30.1*  < > 26.5* 31.2* 31.0* 32.1* 32.6*  PLT 368  --   --  440* 473* 506* 534*  MCV 80.3  --   --  80.4 80.5 81.3 81.1  MCH 26.9  --   --  27.1 26.8 27.1 26.9  MCHC 33.6  --   --  33.7 33.2 33.3 33.1  RDW 18.1*  --   --  18.7* 18.7* 19.2* 19.7*  < > = values in this interval not displayed.  Chemistries   Recent Labs Lab 11/13/16 1510 11/14/16 0414 11/15/16 0436 11/16/16 0518 11/17/16 0625  NA 132* 131* 132* 134* 134*  K 4.0 3.8 3.7 3.7 4.1  CL 99* 98* 104 104 103  CO2 26 26 23 23 23   GLUCOSE 95 76 80 81 83  BUN 15 11 10 10 7   CREATININE 0.83 0.87 0.75 0.73 0.76  CALCIUM 8.4* 8.5* 8.2* 8.4* 8.8*  MG  --  1.8  --   --   --   AST 19 17 19 20 21   ALT 9* 7* 7* 8* 8*  ALKPHOS 69 70 64 64 69  BILITOT 1.0 0.8 0.5 0.4 0.3    ------------------------------------------------------------------------------------------------------------------ No results for input(s): CHOL, HDL, LDLCALC, TRIG, CHOLHDL, LDLDIRECT in the last 72 hours.  No results found for: HGBA1C ------------------------------------------------------------------------------------------------------------------ No results for input(s): TSH, T4TOTAL, T3FREE, THYROIDAB in the last 72 hours.  Invalid input(s): FREET3 ------------------------------------------------------------------------------------------------------------------ No results for input(s): VITAMINB12, FOLATE, FERRITIN, TIBC, IRON, RETICCTPCT in the last 72 hours.  Coagulation profile  Recent Labs Lab 11/12/16 1918  INR 0.97    No results for input(s): DDIMER in the last 72 hours.  Cardiac Enzymes  Recent Labs Lab 11/12/16 1629  TROPONINI <0.03   ------------------------------------------------------------------------------------------------------------------ No results found for: BNP  Micro Results Recent Results (from the past 240 hour(s))  MRSA PCR Screening     Status: None   Collection Time: 11/12/16  9:27 PM  Result Value Ref Range Status   MRSA by PCR NEGATIVE NEGATIVE Final    Comment:        The GeneXpert MRSA Assay (FDA approved for NASAL specimens only), is one component of a comprehensive MRSA colonization surveillance program. It is not intended to diagnose MRSA infection nor to guide or monitor treatment for MRSA infections.     Radiology Reports Dg Chest 2 View  Result Date: 11/12/2016 CLINICAL DATA:  Bright red blood in stool.  Abdominal pain. EXAM: CHEST  2 VIEW COMPARISON:  02/04/2016 FINDINGS: Multiple old left rib fractures. New densities at the left costophrenic angle. Evidence for lung hyperinflation. Heart and mediastinum are within normal limits. Again noted is a compression fracture in the mid thoracic spine. No large pleural effusions.  IMPRESSION: Chronic hyperinflation with new left basilar densities. Findings could represent atelectasis. Electronically Signed   By: Richarda OverlieAdam  Henn M.D.   On: 11/12/2016 18:56   Ct Abdomen Pelvis W Contrast  Result Date: 11/12/2016 CLINICAL DATA:  56 year old male with history of hematochezia at noon today. Abdominal pain for the past several days, worsening today. History of bleeding ulcer. EXAM: CT ABDOMEN AND PELVIS WITH CONTRAST TECHNIQUE: Multidetector CT imaging of the abdomen and pelvis was performed using the standard protocol following bolus administration of intravenous contrast. CONTRAST:  100mL ISOVUE-300 IOPAMIDOL (ISOVUE-300) INJECTION 61% COMPARISON:  CT the abdomen and pelvis 10/29/2012. FINDINGS: Lower chest: Scattered areas of mild cylindrical bronchiectasis are noted in the visualize lung bases, similar to prior studies. Hepatobiliary: No cystic  or solid hepatic lesions. No intra or extrahepatic biliary ductal dilatation. Gallbladder is unremarkable in appearance. Pancreas: No pancreatic mass. No pancreatic ductal dilatation. No pancreatic or peripancreatic fluid or inflammatory changes. Spleen: Status post splenectomy, with a large splenule immediately medial to the left kidney, similar to the prior study. Adrenals/Urinary Tract: Bilateral kidneys and bilateral adrenal glands are normal in appearance. No hydroureteronephrosis. Urinary bladder is unremarkable in appearance (partially obscured by beam hardening artifact from the patient's right hip arthroplasty). Stomach/Bowel: The appearance of the stomach is normal. There is no pathologic dilatation of small bowel or colon. The appendix is not confidently identified and may be surgically absent. Regardless, there are no inflammatory changes noted adjacent to the cecum to suggest the presence of an acute appendicitis at this time. Vascular/Lymphatic: Aortic atherosclerosis, without evidence of aneurysm or dissection in the abdominal or pelvic  vasculature. No lymphadenopathy noted in the abdomen or pelvis. Reproductive: Prostate gland seminal vesicles are unremarkable in appearance. Other: No significant volume of ascites.  No pneumoperitoneum. Musculoskeletal: Status post PLIF at L5-S1 with interbody graft at L5-S1 interspace. There are no aggressive appearing lytic or blastic lesions noted in the visualized portions of the skeleton. Status post right hip arthroplasty. IMPRESSION: 1. No acute findings in the abdomen or pelvis to account for the patient's symptoms. 2. Scattered areas of cylindrical bronchiectasis are again noted in the lung bases bilaterally, chronic and similar to prior examinations. 3. Aortic atherosclerosis. 4. Additional incidental findings, as above. Electronically Signed   By: Trudie Reed M.D.   On: 11/12/2016 18:41   Dg Chest Port 1 View  Result Date: 11/15/2016 CLINICAL DATA:  Shortness of Breath, asthma, hypertension. EXAM: PORTABLE CHEST 1 VIEW COMPARISON:  11/12/2016 FINDINGS: There is hyperinflation of the lungs compatible with COPD. Left basilar atelectasis or infiltrate again noted, unchanged. Right lung is clear. Heart is normal size. No visible effusion or acute bony abnormality. IMPRESSION: Hyperinflation/COPD.  Continued left base atelectasis or infiltrate. Electronically Signed   By: Charlett Nose M.D.   On: 11/15/2016 09:19    Time Spent in minutes  30   SINGH,PRASHANT K M.D on 11/17/2016 at 10:42 AM  Between 7am to 7pm - Pager - [phone]  After 7pm go to [email or web address] - password [facility]  [facility] -  Office  [phone]

## 2016-11-18 DIAGNOSIS — K922 Gastrointestinal hemorrhage, unspecified: Secondary | ICD-10-CM

## 2016-11-18 LAB — COMPREHENSIVE METABOLIC PANEL
ALBUMIN: 3.3 g/dL — AB (ref 3.5–5.0)
ALT: 11 U/L — ABNORMAL LOW (ref 17–63)
ANION GAP: 9 (ref 5–15)
AST: 22 U/L (ref 15–41)
Alkaline Phosphatase: 67 U/L (ref 38–126)
BUN: 14 mg/dL (ref 6–20)
CHLORIDE: 102 mmol/L (ref 101–111)
CO2: 23 mmol/L (ref 22–32)
Calcium: 8.8 mg/dL — ABNORMAL LOW (ref 8.9–10.3)
Creatinine, Ser: 0.8 mg/dL (ref 0.61–1.24)
GFR calc Af Amer: >60 mL/min (ref >60)
GFR calc non Af Amer: >60 mL/min (ref >60)
GLUCOSE: 89 mg/dL (ref 65–99)
POTASSIUM: 3.9 mmol/L (ref 3.5–5.1)
Sodium: 134 mmol/L — ABNORMAL LOW (ref 135–145)
TOTAL PROTEIN: 7 g/dL (ref 6.5–8.1)
Total Bilirubin: 0.5 mg/dL (ref 0.3–1.2)

## 2016-11-18 LAB — CBC
HCT: 32.6 % — ABNORMAL LOW (ref 39.0–52.0)
Hemoglobin: 10.7 g/dL — ABNORMAL LOW (ref 13.0–17.0)
MCH: 26.7 pg (ref 26.0–34.0)
MCHC: 32.8 g/dL (ref 30.0–36.0)
MCV: 81.3 fL (ref 78.0–100.0)
PLATELETS: 550 10*3/uL — AB (ref 150–400)
RBC: 4.01 MIL/uL — ABNORMAL LOW (ref 4.22–5.81)
RDW: 19.7 % — AB (ref 11.5–15.5)
WBC: 13.2 10*3/uL — AB (ref 4.0–10.5)

## 2016-11-18 MED ORDER — OXYCODONE HCL 5 MG PO TABS
5.0000 mg | ORAL_TABLET | ORAL | Status: DC | PRN
  Administered 2016-11-18: 5 mg via ORAL
  Filled 2016-11-18: qty 1

## 2016-11-18 MED ORDER — PANTOPRAZOLE SODIUM 40 MG IV SOLR
40.0000 mg | Freq: Two times a day (BID) | INTRAVENOUS | Status: DC
  Administered 2016-11-18 – 2016-11-19 (×3): 40 mg via INTRAVENOUS
  Filled 2016-11-18 (×3): qty 40

## 2016-11-18 MED ORDER — LORAZEPAM 2 MG/ML IJ SOLN
1.0000 mg | INTRAMUSCULAR | Status: DC | PRN
  Filled 2016-11-18: qty 1

## 2016-11-18 MED ORDER — POLYETHYLENE GLYCOL 3350 17 G PO PACK
17.0000 g | PACK | Freq: Every day | ORAL | Status: DC | PRN
  Administered 2016-11-18: 17 g via ORAL
  Filled 2016-11-18: qty 1

## 2016-11-18 NOTE — Progress Notes (Signed)
    Subjective: Awake, calm. No overt GI bleeding per nursing staff. Confused. No restraints. Nursing staff states he has been calm, only confused. Not combative. Last BM 1/25.   Objective: Vital signs in last 24 hours: Temp:  [97.6 F (36.4 C)-98.6 F (37 C)] 98.2 F (36.8 C) (01/29 0742) Pulse Rate:  [71-129] 77 (01/29 0742) Resp:  [15-31] 20 (01/29 0742) BP: (77-159)/(54-108) 106/92 (01/29 0600) SpO2:  [94 %-100 %] 97 % (01/29 0742) Weight:  [114 lb 13.8 oz (52.1 kg)] 114 lb 13.8 oz (52.1 kg) (01/29 0500) Last BM Date: 11/14/16 General:   Alert and oriented to person only. Calm.  Abdomen:  Bowel sounds present, soft, non-tender, non-distended. No HSM or hernias noted. No rebound or guarding. No masses appreciated  Msk:  Symmetrical without gross deformities. Normal posture. Extremities:  Without edema. Neurologic:  Alert and  oriented to person only. Thinks it is the [id number], believes [place], unable to answer any other questions.    Intake/Output from previous day: 01/28 0701 - 01/29 0700 In: 120 [P.O.:120] Out: 1100 [Urine:1100] Intake/Output this shift: No intake/output data recorded.  Lab Results:  Recent Labs  11/16/16 0518 11/17/16 0625 11/18/16 0446  WBC 11.7* 11.5* 13.2*  HGB 10.7* 10.8* 10.7*  HCT 32.1* 32.6* 32.6*  PLT 506* 534* 550*   BMET  Recent Labs  11/16/16 0518 11/17/16 0625 11/18/16 0446  NA 134* 134* 134*  K 3.7 4.1 3.9  CL 104 103 102  CO2 23 23 23   GLUCOSE 81 83 89  BUN 10 7 14   CREATININE 0.73 0.76 0.80  CALCIUM 8.4* 8.8* 8.8*   LFT  Recent Labs  11/16/16 0518 11/17/16 0625 11/18/16 0446  PROT 6.7 7.1 7.0  ALBUMIN 3.0* 3.3* 3.3*  AST 20 21 22   ALT 8* 8* 11*  ALKPHOS 64 69 67  BILITOT 0.4 0.3 0.5    Assessment: 56 year old male admitted with upper GI bleed in the setting of NSAID use, receiving total of 2 units this admission (11/13/16). No overt GI bleeding, Hgb stable. History of alcohol abuse with DTs this  admission, slowly improving and now awake and alert but still confused. Anticipate EGD likely Wednesday.   Nursing staff requesting assistance with constipation; appears last BM on 1/25. Will start low dose Miralax once daily prn. As he is alert, will order full liquids and advance to soft as tolerated.   Plan: Full liquids, advance to soft diet as tolerated.  Aspiration precautions May start Miralax once daily prn PPI BID Reassess on 1/30 but anticipate procedure 1/31   Gelene MinkAnna W. Luisdaniel Kenton, ANP-BC [facility]     LOS: 6 days    11/18/2016, 7:44 AM

## 2016-11-18 NOTE — Progress Notes (Signed)
PROGRESS NOTE                                                                                                                                                                                                             Patient Demographics:    Raymond Fox, is a 56 y.o. male, DOB - 05-21-1961, ZOX:[id number]  Admit date - 11/12/2016   Admitting Physician Haydee Monica, MD  Outpatient Primary MD for the patient is MUSE,ROCHELLE D., PA-C  LOS - 6  Chief Complaint  Patient presents with  . Rectal Bleeding       Brief Narrative   Raymond Fox is a 56 y.o. male with medical history significant of etoh abuse, gastric ulcer, duodenal ulcer, asthma, polysubstance abuse comes in with 2 days of epigastric abdominal pain and what started off as melana, then progressed to [facility] colored stool, then he started getting nauseated and vomiting dark reddish vomit today.  He denies any fevers.  His last drink was yesterday.  Pt is very nauseated and is coughing a lot.  He has a h/o seizures before I presume from etoh withdrawal.  Pt referred for admission for gib.  Pt had egd in 2014 when he had a bleeding ulcer, at that time had no varices.  Dr fields called with GI and is aware of pt, advised starting an octreotide drip in addition to protonix drip   Subjective:    [name] today Is in bed, mildly sleepy, no headache no SOB, no abd pain.   Assessment  & Plan :     1.Upper GI bleed. History of peptic ulcer disease in the past. Did develop blood loss related acute anemia, was transfused 2 units of packed RBC, now stable H&H, for now continue IV PPI , GI on board Is to undergo EGD on 11/18/2016.    2. Alcohol abuse with DTs. DTs have resolved, continue Librium along with Foley catheter and thiamine supplementation. Counseled to quit alcohol.  3. High blood pressure. Likely due to #2 above. Placed on Catapres and Lopressor  and as needed hydralazine.  4. Smoking. Counseled to quit. Neck or down patch.  5. Left basilar atelectasis. Added IS and flutter valve.   No signs of infection Except mild reactionary leukocytosis, he remains afebrile, no cough or shortness of breath. Hold antibiotics for now  and monitor. He does have history of splenectomy in the past.    Family Communication  :  None  Code Status :  Full  Diet : Diet full liquid Room service appropriate? Yes; Fluid consistency: Thin   Disposition Plan  :  Stepdown  Consults  :  GI  Procedures  :    DVT Prophylaxis  :  SCDs   Lab Results  Component Value Date   PLT 550 (H) 11/18/2016    Inpatient Medications  Scheduled Meds: . amLODipine  10 mg Oral Daily  . chlordiazePOXIDE  10 mg Oral TID  . cloNIDine  0.1 mg Oral TID  . folic acid  1 mg Oral Daily  . Influenza vac split quadrivalent PF  0.5 mL Intramuscular Tomorrow-1000  . metoprolol tartrate  25 mg Oral BID  . multivitamin with minerals  1 tablet Oral Daily  . nicotine  21 mg Transdermal Daily  . pantoprazole (PROTONIX) IV  40 mg Intravenous Q12H  . thiamine  100 mg Oral Daily   Or  . thiamine  100 mg Intravenous Daily   Continuous Infusions:  PRN Meds:.hydrALAZINE, metoprolol, [DISCONTINUED] ondansetron **OR** ondansetron (ZOFRAN) IV, polyethylene glycol  Antibiotics  :    Anti-infectives    Start     Dose/Rate Route Frequency Ordered Stop   11/14/16 0915  cefTRIAXone (ROCEPHIN) 1 g in dextrose 5 % 50 mL IVPB  Status:  Discontinued     1 g 100 mL/hr over 30 Minutes Intravenous Every 24 hours 11/14/16 0906 11/14/16 0906   11/12/16 2200  piperacillin-tazobactam (ZOSYN) IVPB 3.375 g  Status:  Discontinued     3.375 g 12.5 mL/hr over 240 Minutes Intravenous Every 8 hours 11/12/16 2150 11/14/16 0906         Objective:   Vitals:   11/18/16 0400 11/18/16 0500 11/18/16 0600 11/18/16 0742  BP: (!) 82/57 93/70 (!) 106/92   Pulse: 71 77 74 77  Resp: (!) 25 (!) 22 15  20   Temp: 98.6 F (37 C)   98.2 F (36.8 C)  TempSrc: Oral   Oral  SpO2: 97% 95% 95% 97%  Weight:  52.1 kg (114 lb 13.8 oz)    Height:        Wt Readings from Last 3 Encounters:  11/18/16 52.1 kg (114 lb 13.8 oz)  12/07/12 61.1 kg (134 lb 9.6 oz)  11/02/12 60 kg (132 lb 4.4 oz)     Intake/Output Summary (Last 24 hours) at 11/18/16 1015 Last data filed at 11/17/16 2000  Gross per 24 hour  Intake              120 ml  Output             1100 ml  Net             -980 ml     Physical Exam  Awake but confused, in DTs, No new F.N deficits,  .AT,PERRAL Supple Neck,No JVD, No cervical lymphadenopathy appriciated.  Symmetrical Chest wall movement, Good air movement bilaterally, CTAB RRR,No Gallops,Rubs or new Murmurs, No Parasternal Heave +ve B.Sounds, Abd Soft, No tenderness, No organomegaly appriciated, No rebound - guarding or rigidity. No Cyanosis, Clubbing or edema, No new Rash or bruise       Data Review:    CBC  Recent Labs Lab 11/14/16 0414 11/15/16 0436 11/16/16 0518 11/17/16 0625 11/18/16 0446  WBC 12.9* 13.8* 11.7* 11.5* 13.2*  HGB 10.5* 10.3* 10.7* 10.8* 10.7*  HCT 31.2* 31.0*  32.1* 32.6* 32.6*  PLT 440* 473* 506* 534* 550*  MCV 80.4 80.5 81.3 81.1 81.3  MCH 27.1 26.8 27.1 26.9 26.7  MCHC 33.7 33.2 33.3 33.1 32.8  RDW 18.7* 18.7* 19.2* 19.7* 19.7*    Chemistries   Recent Labs Lab 11/14/16 0414 11/15/16 0436 11/16/16 0518 11/17/16 0625 11/18/16 0446  NA 131* 132* 134* 134* 134*  K 3.8 3.7 3.7 4.1 3.9  CL 98* 104 104 103 102  CO2 26 23 23 23 23   GLUCOSE 76 80 81 83 89  BUN 11 10 10 7 14   CREATININE 0.87 0.75 0.73 0.76 0.80  CALCIUM 8.5* 8.2* 8.4* 8.8* 8.8*  MG 1.8  --   --   --   --   AST 17 19 20 21 22   ALT 7* 7* 8* 8* 11*  ALKPHOS 70 64 64 69 67  BILITOT 0.8 0.5 0.4 0.3 0.5   ------------------------------------------------------------------------------------------------------------------ No results for input(s): CHOL, HDL,  LDLCALC, TRIG, CHOLHDL, LDLDIRECT in the last 72 hours.  No results found for: HGBA1C ------------------------------------------------------------------------------------------------------------------ No results for input(s): TSH, T4TOTAL, T3FREE, THYROIDAB in the last 72 hours.  Invalid input(s): FREET3 ------------------------------------------------------------------------------------------------------------------ No results for input(s): VITAMINB12, FOLATE, FERRITIN, TIBC, IRON, RETICCTPCT in the last 72 hours.  Coagulation profile  Recent Labs Lab 11/12/16 1918  INR 0.97    No results for input(s): DDIMER in the last 72 hours.  Cardiac Enzymes  Recent Labs Lab 11/12/16 1629  TROPONINI <0.03   ------------------------------------------------------------------------------------------------------------------ No results found for: BNP  Micro Results Recent Results (from the past 240 hour(s))  MRSA PCR Screening     Status: None   Collection Time: 11/12/16  9:27 PM  Result Value Ref Range Status   MRSA by PCR NEGATIVE NEGATIVE Final    Comment:        The GeneXpert MRSA Assay (FDA approved for NASAL specimens only), is one component of a comprehensive MRSA colonization surveillance program. It is not intended to diagnose MRSA infection nor to guide or monitor treatment for MRSA infections.     Radiology Reports Dg Chest 2 View  Result Date: 11/12/2016 CLINICAL DATA:  Bright red blood in stool.  Abdominal pain. EXAM: CHEST  2 VIEW COMPARISON:  02/04/2016 FINDINGS: Multiple old left rib fractures. New densities at the left costophrenic angle. Evidence for lung hyperinflation. Heart and mediastinum are within normal limits. Again noted is a compression fracture in the mid thoracic spine. No large pleural effusions. IMPRESSION: Chronic hyperinflation with new left basilar densities. Findings could represent atelectasis. Electronically Signed   By: Richarda Overlie M.D.   On:  11/12/2016 18:56   Ct Abdomen Pelvis W Contrast  Result Date: 11/12/2016 CLINICAL DATA:  56 year old male with history of hematochezia at noon today. Abdominal pain for the past several days, worsening today. History of bleeding ulcer. EXAM: CT ABDOMEN AND PELVIS WITH CONTRAST TECHNIQUE: Multidetector CT imaging of the abdomen and pelvis was performed using the standard protocol following bolus administration of intravenous contrast. CONTRAST:  ISOVUE-300 IOPAMIDOL (ISOVUE-300) INJECTION 61% COMPARISON:  CT the abdomen and pelvis 10/29/2012. FINDINGS: Lower chest: Scattered areas of mild cylindrical bronchiectasis are noted in the visualize lung bases, similar to prior studies. Hepatobiliary: No cystic or solid hepatic lesions. No intra or extrahepatic biliary ductal dilatation. Gallbladder is unremarkable in appearance. Pancreas: No pancreatic mass. No pancreatic ductal dilatation. No pancreatic or peripancreatic fluid or inflammatory changes. Spleen: Status post splenectomy, with a large splenule immediately medial to the left kidney, similar to  the prior study. Adrenals/Urinary Tract: Bilateral kidneys and bilateral adrenal glands are normal in appearance. No hydroureteronephrosis. Urinary bladder is unremarkable in appearance (partially obscured by beam hardening artifact from the patient's right hip arthroplasty). Stomach/Bowel: The appearance of the stomach is normal. There is no pathologic dilatation of small bowel or colon. The appendix is not confidently identified and may be surgically absent. Regardless, there are no inflammatory changes noted adjacent to the cecum to suggest the presence of an acute appendicitis at this time. Vascular/Lymphatic: Aortic atherosclerosis, without evidence of aneurysm or dissection in the abdominal or pelvic vasculature. No lymphadenopathy noted in the abdomen or pelvis. Reproductive: Prostate gland seminal vesicles are unremarkable in appearance. Other: No  significant volume of ascites.  No pneumoperitoneum. Musculoskeletal: Status post PLIF at L5-S1 with interbody graft at L5-S1 interspace. There are no aggressive appearing lytic or blastic lesions noted in the visualized portions of the skeleton. Status post right hip arthroplasty. IMPRESSION: 1. No acute findings in the abdomen or pelvis to account for the patient's symptoms. 2. Scattered areas of cylindrical bronchiectasis are again noted in the lung bases bilaterally, chronic and similar to prior examinations. 3. Aortic atherosclerosis. 4. Additional incidental findings, as above. Electronically Signed   By: Trudie Reedaniel  Entrikin M.D.   On: 11/12/2016 18:41   Dg Chest Port 1 View  Result Date: 11/15/2016 CLINICAL DATA:  Shortness of Breath, asthma, hypertension. EXAM: PORTABLE CHEST 1 VIEW COMPARISON:  11/12/2016 FINDINGS: There is hyperinflation of the lungs compatible with COPD. Left basilar atelectasis or infiltrate again noted, unchanged. Right lung is clear. Heart is normal size. No visible effusion or acute bony abnormality. IMPRESSION: Hyperinflation/COPD.  Continued left base atelectasis or infiltrate. Electronically Signed   By: Charlett NoseKevin  Dover M.D.   On: 11/15/2016 09:19    Time Spent in minutes  30   SINGH,PRASHANT K M.D on 11/18/2016 at 10:15 AM  Between 7am to 7pm - Pager - [phone]  After 7pm go to [email or web address] - password [email or web address]  [facility] -  Office  [phone]

## 2016-11-18 NOTE — Evaluation (Signed)
Physical Therapy Evaluation Patient Details Name: Raymond Fox MRN: [id number] DOB: January 12, 1961 Today's Date: 11/18/2016   History of Present Illness  56 y.o. male with medical history significant of etoh abuse, gastric ulcer, duodenal ulcer, asthma, polysubstance abuse comes in with 2 days of epigastric abdominal pain and what started off as melana, then progressed to maroonish colored stool, then he started getting nauseated and vomiting dark reddish vomit today.  He denies any fevers.  His last drink was yesterday.  Pt is very nauseated and is coughing a lot.  He has a h/o seizures before I presume from etoh withdrawal.  Pt referred for admission for gib.  Pt had egd in 2014 when he had a bleeding ulcer, at that time had no varices.  Dx: Upper GIB.  GI on board Is to undergo EGD on 11/18/2016.   Clinical Impression  Pt received sitting up in the chair, very lethargic, but agreeable to PT evaluation.  Pt has difficulty answering PLOF questions, and is an unreliable historian given that he is only oriented to self and place.  He states that he normally is independent with ambulation, dressing and bathing.  Uncertain if he has any assistance at home.  Pt reports that he lives alone, but the nurse reports that his girlfriend lives with him.  At this point he is requiring up to mod a for all transfers, as well as short distance gait, and therefore he is recommended for SNF.      Follow Up Recommendations SNF;Supervision/Assistance - 24 hour    Equipment Recommendations  None recommended by PT    Recommendations for Other Services OT consult     Precautions / Restrictions Precautions Precautions: Fall;Other (comment) (Seizure precautions) Precaution Comments: Due to immobiltiy and diminished cognition Restrictions Weight Bearing Restrictions: No      Mobility  Bed Mobility Overal bed mobility: Needs Assistance Bed Mobility: Sit to Supine       Sit to supine: Mod assist       Transfers Overall transfer level: Needs assistance Equipment used: Rolling walker (2 wheeled) Transfers: Sit to/from Stand Sit to Stand: Mod assist         General transfer comment: with increased time and tactile cues for hand placement.   Ambulation/Gait Ambulation/Gait assistance: Mod assist Ambulation Distance (Feet): 10 Feet Assistive device: Rolling walker (2 wheeled) Gait Pattern/deviations: Step-to pattern;Trunk flexed;Narrow base of support;Shuffle   Gait velocity interpretation: <1.8 ft/sec, indicative of risk for recurrent falls General Gait Details: Pt demonstrates heavily forward flexed posture, and requires assistance for RW navigation, and for directionality.  Stairs            Wheelchair Mobility    Modified Rankin (Stroke Patients Only)       Balance Overall balance assessment: Needs assistance Sitting-balance support: Bilateral upper extremity supported;Feet supported Sitting balance-Leahy Scale: Fair     Standing balance support: Bilateral upper extremity supported Standing balance-Leahy Scale: Poor                               Pertinent Vitals/Pain Pain Assessment: Faces Faces Pain Scale: Hurts a little bit Pain Location: Abdomen Pain Intervention(s): Limited activity within patient's tolerance;Monitored during session;Repositioned    Home Living   Living Arrangements: Other (Comment) (Pt states that he does not live with anyone, however, the nurse states that he lives with his girlfriend. )     Home Access: Stairs to enter   Entrance  Stairs-Number of Steps: 1 Home Layout: One level Home Equipment: None      Prior Function Level of Independence: Independent               Hand Dominance        Extremity/Trunk Assessment   Upper Extremity Assessment Upper Extremity Assessment: Generalized weakness    Lower Extremity Assessment Lower Extremity Assessment: Generalized weakness       Communication       Cognition Arousal/Alertness: Lethargic Behavior During Therapy: Flat affect Overall Cognitive Status: Impaired/Different from baseline Area of Impairment: Orientation Orientation Level:  (Pt was able to correctly choose hospital when given 3 choices, but does not know the reason for his admission or the date. )                  General Comments      Exercises     Assessment/Plan    PT Assessment Patient needs continued PT services  PT Problem List Decreased strength;Decreased activity tolerance;Decreased balance;Decreased mobility;Decreased coordination;Decreased cognition;Decreased knowledge of use of DME;Decreased safety awareness;Decreased knowledge of precautions          PT Treatment Interventions DME instruction;Functional mobility training;Therapeutic activities;Therapeutic exercise;Balance training;Patient/family education;Gait training;Cognitive remediation    PT Goals (Current goals can be found in the Care Plan section)  Acute Rehab PT Goals Patient Stated Goal: none stated PT Goal Formulation: Patient unable to participate in goal setting Time For Goal Achievement: 12/02/16 Potential to Achieve Goals: Fair    Frequency Min 3X/week   Barriers to discharge Decreased caregiver support      Co-evaluation               End of Session Equipment Utilized During Treatment: Gait belt;Oxygen Activity Tolerance: Patient limited by fatigue Patient left: in bed;with call bell/phone within reach;with bed alarm set Nurse Communication: Mobility status Sheria Lang(Cameron, RN notified of pt's position and mobility status.  Mobility sheet hanging in the room. )    Functional Assessment Tool Used: [employer] AM-PAC "6-clicks"  Functional Limitation: Mobility: Walking and moving around Mobility: Walking and Moving Around Current Status [phone]): At least 40 percent but less than 60 percent impaired, limited or restricted Mobility: Walking and Moving Around Goal  Status [phone](G8979): At least 20 percent but less than 40 percent impaired, limited or restricted    Time: 1605-1630 PT Time Calculation (min) (ACUTE ONLY): 25 min   Charges:   PT Evaluation $PT Eval Low Complexity: 1 Procedure PT Treatments $Gait Training: 8-22 mins   PT G Codes:   PT G-Codes **NOT FOR INPATIENT CLASS** Functional Assessment Tool Used: [employer] AM-PAC "6-clicks"  Functional Limitation: Mobility: Walking and moving around Mobility: Walking and Moving Around Current Status [phone](G8978): At least 40 percent but less than 60 percent impaired, limited or restricted Mobility: Walking and Moving Around Goal Status [phone](G8979): At least 20 percent but less than 40 percent impaired, limited or restricted   Beth Denney Shein, PT, DPT X: [phone]

## 2016-11-19 ENCOUNTER — Inpatient Hospital Stay (HOSPITAL_COMMUNITY): Payer: Medicare HMO

## 2016-11-19 LAB — CBC
HCT: 33.9 % — ABNORMAL LOW (ref 39.0–52.0)
Hemoglobin: 10.8 g/dL — ABNORMAL LOW (ref 13.0–17.0)
MCH: 26.3 pg (ref 26.0–34.0)
MCHC: 31.9 g/dL (ref 30.0–36.0)
MCV: 82.7 fL (ref 78.0–100.0)
PLATELETS: 558 10*3/uL — AB (ref 150–400)
RBC: 4.1 MIL/uL — ABNORMAL LOW (ref 4.22–5.81)
RDW: 20.2 % — ABNORMAL HIGH (ref 11.5–15.5)
WBC: 18.5 10*3/uL — ABNORMAL HIGH (ref 4.0–10.5)

## 2016-11-19 LAB — COMPREHENSIVE METABOLIC PANEL
ALBUMIN: 3.3 g/dL — AB (ref 3.5–5.0)
ALT: 9 U/L — ABNORMAL LOW (ref 17–63)
ANION GAP: 9 (ref 5–15)
AST: 20 U/L (ref 15–41)
Alkaline Phosphatase: 73 U/L (ref 38–126)
BUN: 13 mg/dL (ref 6–20)
CHLORIDE: 101 mmol/L (ref 101–111)
CO2: 25 mmol/L (ref 22–32)
Calcium: 9.1 mg/dL (ref 8.9–10.3)
Creatinine, Ser: 0.89 mg/dL (ref 0.61–1.24)
GFR calc non Af Amer: >60 mL/min (ref >60)
GLUCOSE: 82 mg/dL (ref 65–99)
POTASSIUM: 4.2 mmol/L (ref 3.5–5.1)
SODIUM: 135 mmol/L (ref 135–145)
Total Bilirubin: 0.4 mg/dL (ref 0.3–1.2)
Total Protein: 7 g/dL (ref 6.5–8.1)

## 2016-11-19 MED ORDER — CLONIDINE HCL 0.1 MG PO TABS: 0.1000 mg | ORAL_TABLET | Freq: Three times a day (TID) | ORAL | Status: DC | PRN

## 2016-11-19 MED ORDER — PANTOPRAZOLE SODIUM 40 MG PO TBEC
40.0000 mg | DELAYED_RELEASE_TABLET | Freq: Two times a day (BID) | ORAL | Status: DC
  Administered 2016-11-19 – 2016-11-21 (×4): 40 mg via ORAL
  Filled 2016-11-19 (×4): qty 1

## 2016-11-19 MED ORDER — CHLORDIAZEPOXIDE HCL 5 MG PO CAPS
5.0000 mg | ORAL_CAPSULE | Freq: Three times a day (TID) | ORAL | Status: DC
  Administered 2016-11-19 – 2016-11-21 (×7): 5 mg via ORAL
  Filled 2016-11-19 (×7): qty 1

## 2016-11-19 MED ORDER — SODIUM CHLORIDE 0.9 % IV SOLN
3.0000 g | Freq: Four times a day (QID) | INTRAVENOUS | Status: DC
  Administered 2016-11-19 – 2016-11-21 (×7): 3 g via INTRAVENOUS
  Filled 2016-11-19 (×13): qty 3

## 2016-11-19 MED ORDER — ACETAMINOPHEN 325 MG PO TABS: 650.0000 mg | ORAL_TABLET | Freq: Four times a day (QID) | ORAL | Status: DC | PRN

## 2016-11-19 MED ORDER — METOPROLOL TARTRATE 50 MG PO TABS
50.0000 mg | ORAL_TABLET | Freq: Two times a day (BID) | ORAL | Status: DC
  Administered 2016-11-19 – 2016-11-21 (×4): 50 mg via ORAL
  Filled 2016-11-19 (×5): qty 1

## 2016-11-19 NOTE — Progress Notes (Addendum)
Pharmacy Antibiotic Note  Raymond Fox is a 56 y.o. male admitted on 11/12/2016 with aspiration pneumonia.  Pharmacy has been consulted for unasyn dosing. Patient with possible h/o PCN allergy. Recently on zosyn without problems 1/23-1/25 for intra-abdominal infection, tolerated. Discussed with MD and will watch closely, notified RN as well. Plan: Unasyn 3gm IV q6h Monitor V/S and labs F/U plan  Height: 5\' 6"  (167.6 cm) Weight: 111 lb 5.3 oz (50.5 kg) IBW/kg (Calculated) : 63.8  Temp (24hrs), Avg:98.3 F (36.8 C), Min:98 F (36.7 C), Max:98.6 F (37 C)   Recent Labs Lab 11/12/16 1629 11/12/16 1931  11/15/16 0436 11/16/16 0518 11/17/16 0625 11/18/16 0446 11/19/16 0528  WBC  --   --   < > 13.8* 11.7* 11.5* 13.2* 18.5*  CREATININE  --   --   < > 0.75 0.73 0.76 0.80 0.89  LATICACIDVEN 1.6 1.5  --   --   --   --   --   --   < > = values in this interval not displayed.  Estimated Creatinine Clearance: 65.4 mL/min (by C-G formula based on SCr of 0.89 mg/dL).    Allergies  Allergen Reactions  . Penicillins Rash    Has patient had a PCN reaction causing immediate rash, facial/tongue/throat swelling, SOB or lightheadedness with hypotension: Yes Has patient had a PCN reaction causing severe rash involving mucus membranes or skin necrosis: No Has patient had a PCN reaction that required hospitalization No  Tolerated zosyn for 3 days without problems 1/23 Has patient had a PCN reaction occurring within the last 10 years: No If all of the above answers are "NO", then may proceed with Cephalosporin use. 1    Antimicrobials this admission: unasyn 1/30>>  Zosyn 1/23>>1/25  Microbiology results: 1/23 MRSA PCR: negative  Thank you for allowing pharmacy to be a part of this patient's care.  Elder CyphersLorie Haris Baack, BS Pharm D, [place] Clinical Pharmacist Pager [phone] 11/19/2016 9:56 AM

## 2016-11-19 NOTE — Progress Notes (Signed)
    Subjective: Still confused but no overt GI bleeding. Conversant today. No agitation. Tolerating diet.   Objective: Vital signs in last 24 hours: Temp:  [98 F (36.7 C)-98.6 F (37 C)] 98 F (36.7 C) (01/30 0400) Pulse Rate:  [68-93] 88 (01/29 2114) Resp:  [17-24] 18 (01/29 1800) BP: (78-123)/(62-93) 120/93 (01/29 2114) SpO2:  [86 %-100 %] 94 % (01/29 1800) Weight:  [111 lb 5.3 oz (50.5 kg)] 111 lb 5.3 oz (50.5 kg) (01/30 0500) Last BM Date: 11/14/16 General:   Alert to person only.  Head:  Normocephalic and atraumatic. Abdomen:  Bowel sounds present, soft, non-tender, non-distended.  Extremities:  Without  edema. Neurologic:  Alert and  oriented to person only.  Psych:  Alert and cooperative. Normal mood and affect.  Intake/Output from previous day: 01/29 0701 - 01/30 0700 In: 460 [P.O.:460] Out: 700 [Urine:700] Intake/Output this shift: No intake/output data recorded.  Lab Results:  Recent Labs  11/17/16 0625 11/18/16 0446 11/19/16 0528  WBC 11.5* 13.2* 18.5*  HGB 10.8* 10.7* 10.8*  HCT 32.6* 32.6* 33.9*  PLT 534* 550* 558*   BMET  Recent Labs  11/17/16 0625 11/18/16 0446 11/19/16 0528  NA 134* 134* 135  K 4.1 3.9 4.2  CL 103 102 101  CO2 23 23 25   GLUCOSE 83 89 82  BUN 7 14 13   CREATININE 0.76 0.80 0.89  CALCIUM 8.8* 8.8* 9.1   LFT  Recent Labs  11/17/16 0625 11/18/16 0446 11/19/16 0528  PROT 7.1 7.0 7.0  ALBUMIN 3.3* 3.3* 3.3*  AST 21 22 20   ALT 8* 11* 9*  ALKPHOS 69 67 73  BILITOT 0.3 0.5 0.4     Studies/Results: Dg Chest Port 1 View  Result Date: 11/19/2016 CLINICAL DATA:  56 year old male with shortness of breath. Initial encounter. EXAM: PORTABLE CHEST 1 VIEW COMPARISON:  11/15/2016 and earlier. FINDINGS: Portable AP upright view at 0705 hours. Lung volumes remain stable. Mediastinal contours remain normal. Visualized tracheal air column is within normal limits. Multilevel chronic left lateral rib fractures. Allowing for  portable technique the lungs are clear. No pneumothorax or pleural effusion. IMPRESSION: No acute cardiopulmonary abnormality. Electronically Signed   By: Odessa FlemingH  Hall M.D.   On: 11/19/2016 07:47    Assessment: 56 year old male admitted with upper GI bleed in the setting of NSAID use, receiving total of 2 units this admission (11/13/16). No overt GI bleeding, Hgb stable. History of alcohol abuse with DTs this admission, slowly improved and now awake, conversant but confused regarding place, time, situation. DTs have resolved. Anticipate EGD likely Wednesday.   Leukocytosis: query possible mild aspiration. Empirically started on Unasyn.   Plan: NPO after midnight Will reassess in the morning and anticipate EGD with Dr. Jena Gaussourk (Propofol) 1/31 Change Miralax from prn to scheduled Continue PPI BID   Gelene MinkAnna W. Chitara Clonch, ANP-BC [facility]    LOS: 7 days    11/19/2016, 8:09 AM

## 2016-11-19 NOTE — Progress Notes (Signed)
Report given to Misty StanleyLisa, Charity fundraiserN. Pt to transfer to 300, all pertinent patient information given and IV working at time of transfer. Pt transferred via wheelchair on room air accompanied by ICU staff.

## 2016-11-19 NOTE — Progress Notes (Signed)
PROGRESS NOTE                                                                                                                                                                                                             Patient Demographics:    Raymond Fox, is a 56 y.o. male, DOB - [birth year], WUJ:[id number]  Admit date - 11/12/2016   Admitting Physician Haydee Monica, MD  Outpatient Primary MD for the patient is MUSE,ROCHELLE D., PA-C  LOS - 7  Chief Complaint  Patient presents with  . Rectal Bleeding       Brief Narrative   Raymond Fox is a 56 y.o. male with medical history significant of etoh abuse, gastric ulcer, duodenal ulcer, asthma, polysubstance abuse was admitted on 11/12/2016 for upper GI bleed.    Required 2 units of packed RBC on the day of admission, was kept on IV PPI, seen by GI, he underwent DTs which resolved on 11/18/2016, he is due for EGD on 11/20/2016. Currently has clinical suspicion of aspiration pneumonia.   Subjective:    Raymond Fox today Is in bed, mildly sleepy, no headache no SOB, no abd pain.   Assessment  & Plan :     1.Upper GI bleed. History of peptic ulcer disease in the past. Did develop blood loss related acute anemia, was transfused 2 units of packed RBC, now stable H&H, for now continue IV PPI , GI on board Is to undergo EGD on 11/20/2016.    2. Alcohol abuse with DTs. DTs have resolved, continue Librium along with Folic acid and thiamine supplementation. Counseled to quit alcohol.  3. High blood pressure. Likely due to #2 above. Placed on Catapres When necessary along with Norvasc and Lopressor scheduled .  4. Smoking. Counseled to quit. Neck or down patch.  5. Left basilar atelectasis. Added IS and flutter valve.  6. Impressive leukocytosis on 11/19/2016, chest x-ray portable remains unremarkable, I think he might have had mild aspiration and he was undergoing  DTs will place him on Unasyn on 11/19/2016 and monitor CBC. Currently has mild cough but no shortness of breath or fever.    Family Communication  :  None  Code Status :  Full  Diet : Diet full liquid Room service appropriate? Yes; Fluid consistency: Thin   Disposition Plan  :  Transfer to MedSurg, will require SNF social work consulted  Consults  :  GI  Procedures  :    EGD - 11/20/16   DVT Prophylaxis  :  SCDs   Lab Results  Component Value Date   PLT 558 (H) 11/19/2016    Inpatient Medications  Scheduled Meds: . amLODipine  10 mg Oral Daily  . chlordiazePOXIDE  5 mg Oral TID  . folic acid  1 mg Oral Daily  . Influenza vac split quadrivalent PF  0.5 mL Intramuscular Tomorrow-1000  . metoprolol tartrate  50 mg Oral BID  . multivitamin with minerals  1 tablet Oral Daily  . nicotine  21 mg Transdermal Daily  . pantoprazole (PROTONIX) IV  40 mg Intravenous Q12H  . thiamine  100 mg Oral Daily   Continuous Infusions:  PRN Meds:.cloNIDine, hydrALAZINE, metoprolol, [DISCONTINUED] ondansetron **OR** ondansetron (ZOFRAN) IV, oxyCODONE, polyethylene glycol  Antibiotics  :    Anti-infectives    Start     Dose/Rate Route Frequency Ordered Stop   11/14/16 0915  cefTRIAXone (ROCEPHIN) 1 g in dextrose 5 % 50 mL IVPB  Status:  Discontinued     1 g 100 mL/hr over 30 Minutes Intravenous Every 24 hours 11/14/16 0906 11/14/16 0906   11/12/16 2200  piperacillin-tazobactam (ZOSYN) IVPB 3.375 g  Status:  Discontinued     3.375 g 12.5 mL/hr over 240 Minutes Intravenous Every 8 hours 11/12/16 2150 11/14/16 0906         Objective:   Vitals:   11/18/16 2114 11/19/16 0000 11/19/16 0400 11/19/16 0500  BP: (!) 120/93     Pulse: 88     Resp:      Temp:  98.5 F (36.9 C) 98 F (36.7 C)   TempSrc:  Oral Axillary   SpO2:      Weight:    50.5 kg (111 lb 5.3 oz)  Height:        Wt Readings from Last 3 Encounters:  11/19/16 50.5 kg (111 lb 5.3 oz)  12/07/12 61.1 kg (134 lb 9.6  oz)  11/02/12 60 kg (132 lb 4.4 oz)     Intake/Output Summary (Last 24 hours) at 11/19/16 0904 Last data filed at 11/19/16 0857  Gross per 24 hour  Intake              380 ml  Output              700 ml  Net             -320 ml     Physical Exam  Awake & Alert, No Raymond F.N deficits,  Raymond Fox,PERRAL Supple Neck,No JVD, No cervical lymphadenopathy appriciated.  Symmetrical Chest wall movement, Good air movement bilaterally, few crackles at the bases RRR,No Gallops,Rubs or Raymond Murmurs, No Parasternal Heave +ve B.Sounds, Abd Soft, No tenderness, No organomegaly appriciated, No rebound - guarding or rigidity. No Cyanosis, Clubbing or edema, No Raymond Rash or bruise       Data Review:    CBC  Recent Labs Lab 11/15/16 0436 11/16/16 0518 11/17/16 0625 11/18/16 0446 11/19/16 0528  WBC 13.8* 11.7* 11.5* 13.2* 18.5*  HGB 10.3* 10.7* 10.8* 10.7* 10.8*  HCT 31.0* 32.1* 32.6* 32.6* 33.9*  PLT 473* 506* 534* 550* 558*  MCV 80.5 81.3 81.1 81.3 82.7  MCH 26.8 27.1 26.9 26.7 26.3  MCHC 33.2 33.3 33.1 32.8 31.9  RDW 18.7* 19.2* 19.7* 19.7* 20.2*    Chemistries   Recent Labs Lab 11/14/16 0414 11/15/16  3086 11/16/16 0518 11/17/16 0625 11/18/16 0446 11/19/16 0528  NA 131* 132* 134* 134* 134* 135  K 3.8 3.7 3.7 4.1 3.9 4.2  CL 98* 104 104 103 102 101  CO2 26 23 23 23 23 25   GLUCOSE 76 80 81 83 89 82  BUN 11 10 10 7 14 13   CREATININE 0.87 0.75 0.73 0.76 0.80 0.89  CALCIUM 8.5* 8.2* 8.4* 8.8* 8.8* 9.1  MG 1.8  --   --   --   --   --   AST 17 19 20 21 22 20   ALT 7* 7* 8* 8* 11* 9*  ALKPHOS 70 64 64 69 67 73  BILITOT 0.8 0.5 0.4 0.3 0.5 0.4   ------------------------------------------------------------------------------------------------------------------ No results for input(s): CHOL, HDL, LDLCALC, TRIG, CHOLHDL, LDLDIRECT in the last 72 hours.  No results found for:  HGBA1C ------------------------------------------------------------------------------------------------------------------ No results for input(s): TSH, T4TOTAL, T3FREE, THYROIDAB in the last 72 hours.  Invalid input(s): FREET3 ------------------------------------------------------------------------------------------------------------------ No results for input(s): VITAMINB12, FOLATE, FERRITIN, TIBC, IRON, RETICCTPCT in the last 72 hours.  Coagulation profile  Recent Labs Lab 11/12/16 1918  INR 0.97    No results for input(s): DDIMER in the last 72 hours.  Cardiac Enzymes  Recent Labs Lab 11/12/16 1629  TROPONINI <0.03   ------------------------------------------------------------------------------------------------------------------ No results found for: BNP  Micro Results Recent Results (from the past 240 hour(s))  MRSA PCR Screening     Status: None   Collection Time: 11/12/16  9:27 PM  Result Value Ref Range Status   MRSA by PCR NEGATIVE NEGATIVE Final    Comment:        The GeneXpert MRSA Assay (FDA approved for NASAL specimens only), is one component of a comprehensive MRSA colonization surveillance program. It is not intended to diagnose MRSA infection nor to guide or monitor treatment for MRSA infections.     Radiology Reports Dg Chest 2 View  Result Date: 11/12/2016 CLINICAL DATA:  Bright red blood in stool.  Abdominal pain. EXAM: CHEST  2 VIEW COMPARISON:  02/04/2016 FINDINGS: Multiple old left rib fractures. Raymond densities at the left costophrenic angle. Evidence for lung hyperinflation. Heart and mediastinum are within normal limits. Again noted is a compression fracture in the mid thoracic spine. No large pleural effusions. IMPRESSION: Chronic hyperinflation with Raymond left basilar densities. Findings could represent atelectasis. Electronically Signed   By: Richarda Overlie M.D.   On: 11/12/2016 18:56   Ct Abdomen Pelvis W Contrast  Result Date:  11/12/2016 CLINICAL DATA:  56 year old male with history of hematochezia at noon today. Abdominal pain for the past several days, worsening today. History of bleeding ulcer. EXAM: CT ABDOMEN AND PELVIS WITH CONTRAST TECHNIQUE: Multidetector CT imaging of the abdomen and pelvis was performed using the standard protocol following bolus administration of intravenous contrast. CONTRAST:  ISOVUE-300 IOPAMIDOL (ISOVUE-300) INJECTION 61% COMPARISON:  CT the abdomen and pelvis 10/29/2012. FINDINGS: Lower chest: Scattered areas of mild cylindrical bronchiectasis are noted in the visualize lung bases, similar to prior studies. Hepatobiliary: No cystic or solid hepatic lesions. No intra or extrahepatic biliary ductal dilatation. Gallbladder is unremarkable in appearance. Pancreas: No pancreatic mass. No pancreatic ductal dilatation. No pancreatic or peripancreatic fluid or inflammatory changes. Spleen: Status post splenectomy, with a large splenule immediately medial to the left kidney, similar to the prior study. Adrenals/Urinary Tract: Bilateral kidneys and bilateral adrenal glands are normal in appearance. No hydroureteronephrosis. Urinary bladder is unremarkable in appearance (partially obscured by beam hardening artifact from the patient's right  hip arthroplasty). Stomach/Bowel: The appearance of the stomach is normal. There is no pathologic dilatation of small bowel or colon. The appendix is not confidently identified and may be surgically absent. Regardless, there are no inflammatory changes noted adjacent to the cecum to suggest the presence of an acute appendicitis at this time. Vascular/Lymphatic: Aortic atherosclerosis, without evidence of aneurysm or dissection in the abdominal or pelvic vasculature. No lymphadenopathy noted in the abdomen or pelvis. Reproductive: Prostate gland seminal vesicles are unremarkable in appearance. Other: No significant volume of ascites.  No pneumoperitoneum. Musculoskeletal:  Status post PLIF at L5-S1 with interbody graft at L5-S1 interspace. There are no aggressive appearing lytic or blastic lesions noted in the visualized portions of the skeleton. Status post right hip arthroplasty. IMPRESSION: 1. No acute findings in the abdomen or pelvis to account for the patient's symptoms. 2. Scattered areas of cylindrical bronchiectasis are again noted in the lung bases bilaterally, chronic and similar to prior examinations. 3. Aortic atherosclerosis. 4. Additional incidental findings, as above. Electronically Signed   By: Trudie Reedaniel  Entrikin M.D.   On: 11/12/2016 18:41   Dg Chest Port 1 View  Result Date: 11/19/2016 CLINICAL DATA:  56 year old male with shortness of breath. Initial encounter. EXAM: PORTABLE CHEST 1 VIEW COMPARISON:  11/15/2016 and earlier. FINDINGS: Portable AP upright view at 0705 hours. Lung volumes remain stable. Mediastinal contours remain normal. Visualized tracheal air column is within normal limits. Multilevel chronic left lateral rib fractures. Allowing for portable technique the lungs are clear. No pneumothorax or pleural effusion. IMPRESSION: No acute cardiopulmonary abnormality. Electronically Signed   By: Odessa FlemingH  Hall M.D.   On: 11/19/2016 07:47   Dg Chest Port 1 View  Result Date: 11/15/2016 CLINICAL DATA:  Shortness of Breath, asthma, hypertension. EXAM: PORTABLE CHEST 1 VIEW COMPARISON:  11/12/2016 FINDINGS: There is hyperinflation of the lungs compatible with COPD. Left basilar atelectasis or infiltrate again noted, unchanged. Right lung is clear. Heart is normal size. No visible effusion or acute bony abnormality. IMPRESSION: Hyperinflation/COPD.  Continued left base atelectasis or infiltrate. Electronically Signed   By: Charlett NoseKevin  Dover M.D.   On: 11/15/2016 09:19    Time Spent in minutes  30   Hedi Barkan K M.D on 11/19/2016 at 9:04 AM  Between 7am to 7pm - Pager - [phone]  After 7pm go to [email or web address] - password [email or web address]  [facility] -   Office  [phone]

## 2016-11-19 NOTE — Progress Notes (Signed)
Pt attempts to get out of bed. Pt states that he lives across the street and that he has to go to court. Bed alarm on and door open.

## 2016-11-20 ENCOUNTER — Inpatient Hospital Stay (HOSPITAL_COMMUNITY): Payer: Medicare HMO | Admitting: Anesthesiology

## 2016-11-20 ENCOUNTER — Encounter (HOSPITAL_COMMUNITY): Payer: Self-pay | Admitting: *Deleted

## 2016-11-20 ENCOUNTER — Encounter (HOSPITAL_COMMUNITY)
Admission: EM | Disposition: A | Discharge status: Skilled Nursing Facility | Payer: Self-pay | Source: Home / Self Care | Attending: Internal Medicine

## 2016-11-20 DIAGNOSIS — K921 Melena: Principal | ICD-10-CM

## 2016-11-20 DIAGNOSIS — K264 Chronic or unspecified duodenal ulcer with hemorrhage: Secondary | ICD-10-CM

## 2016-11-20 HISTORY — PX: ESOPHAGOGASTRODUODENOSCOPY (EGD) WITH PROPOFOL: SHX5813

## 2016-11-20 LAB — CBC
HCT: 33.2 % — ABNORMAL LOW (ref 39.0–52.0)
Hemoglobin: 11 g/dL — ABNORMAL LOW (ref 13.0–17.0)
MCH: 27.3 pg (ref 26.0–34.0)
MCHC: 33.1 g/dL (ref 30.0–36.0)
MCV: 82.4 fL (ref 78.0–100.0)
PLATELETS: 579 10*3/uL — AB (ref 150–400)
RBC: 4.03 MIL/uL — AB (ref 4.22–5.81)
RDW: 19.9 % — ABNORMAL HIGH (ref 11.5–15.5)
WBC: 12.9 10*3/uL — ABNORMAL HIGH (ref 4.0–10.5)

## 2016-11-20 LAB — COMPREHENSIVE METABOLIC PANEL
ALBUMIN: 3.3 g/dL — AB (ref 3.5–5.0)
ALK PHOS: 69 U/L (ref 38–126)
ALT: 10 U/L — AB (ref 17–63)
ANION GAP: 10 (ref 5–15)
AST: 23 U/L (ref 15–41)
BUN: 13 mg/dL (ref 6–20)
CHLORIDE: 98 mmol/L — AB (ref 101–111)
CO2: 24 mmol/L (ref 22–32)
Calcium: 8.7 mg/dL — ABNORMAL LOW (ref 8.9–10.3)
Creatinine, Ser: 0.97 mg/dL (ref 0.61–1.24)
GFR calc non Af Amer: >60 mL/min (ref >60)
GLUCOSE: 77 mg/dL (ref 65–99)
Potassium: 3.9 mmol/L (ref 3.5–5.1)
SODIUM: 132 mmol/L — AB (ref 135–145)
Total Bilirubin: 0.4 mg/dL (ref 0.3–1.2)
Total Protein: 6.9 g/dL (ref 6.5–8.1)

## 2016-11-20 SURGERY — ESOPHAGOGASTRODUODENOSCOPY (EGD) WITH PROPOFOL
Anesthesia: Monitor Anesthesia Care

## 2016-11-20 MED ORDER — MIDAZOLAM HCL 2 MG/2ML IJ SOLN
INTRAMUSCULAR | Status: AC
  Filled 2016-11-20: qty 2

## 2016-11-20 MED ORDER — SUCCINYLCHOLINE CHLORIDE 20 MG/ML IJ SOLN
INTRAMUSCULAR | Status: AC
Start: 2016-11-20 — End: 2016-11-20
  Filled 2016-11-20: qty 1

## 2016-11-20 MED ORDER — PROPOFOL 500 MG/50ML IV EMUL
INTRAVENOUS | Status: DC | PRN
  Administered 2016-11-20: 150 ug/kg/min via INTRAVENOUS

## 2016-11-20 MED ORDER — LIDOCAINE VISCOUS 2 % MT SOLN
OROMUCOSAL | Status: AC
  Filled 2016-11-20: qty 15

## 2016-11-20 MED ORDER — LIDOCAINE VISCOUS 2 % MT SOLN
15.0000 mL | Freq: Once | OROMUCOSAL | Status: AC
  Administered 2016-11-20: 3 mL via OROMUCOSAL

## 2016-11-20 MED ORDER — LIDOCAINE HCL (CARDIAC) 10 MG/ML IV SOLN
INTRAVENOUS | Status: DC | PRN
  Administered 2016-11-20: 40 mg via INTRAVENOUS

## 2016-11-20 MED ORDER — SODIUM CHLORIDE 0.9 % IV SOLN: INTRAVENOUS | Status: DC

## 2016-11-20 MED ORDER — MIDAZOLAM HCL 2 MG/2ML IJ SOLN
0.5000 mg | INTRAMUSCULAR | Status: DC | PRN
Start: 2016-11-20 — End: 2016-11-20
  Administered 2016-11-20: 2 mg via INTRAVENOUS

## 2016-11-20 MED ORDER — ARTIFICIAL TEARS OP OINT
TOPICAL_OINTMENT | OPHTHALMIC | Status: AC
  Filled 2016-11-20: qty 3.5

## 2016-11-20 MED ORDER — MIDAZOLAM HCL 5 MG/5ML IJ SOLN
INTRAMUSCULAR | Status: DC | PRN
  Administered 2016-11-20 (×2): 1 mg via INTRAVENOUS

## 2016-11-20 MED ORDER — PROPOFOL 10 MG/ML IV BOLUS
INTRAVENOUS | Status: AC
  Filled 2016-11-20: qty 20

## 2016-11-20 MED ORDER — LIDOCAINE HCL (PF) 1 % IJ SOLN
INTRAMUSCULAR | Status: AC
  Filled 2016-11-20: qty 5

## 2016-11-20 MED ORDER — LACTATED RINGERS IV SOLN
INTRAVENOUS | Status: DC
  Administered 2016-11-20: 14:00:00 via INTRAVENOUS

## 2016-11-20 NOTE — Progress Notes (Signed)
PROGRESS NOTE                                                                                                                                                                                                             Patient Demographics:    Raymond Fox, is a 56 y.o. male, DOB - [birth year], EAV:[id number]  Admit date - 11/12/2016   Admitting Physician Haydee Monica, MD  Outpatient Primary MD for the patient is MUSE,ROCHELLE D., PA-C  LOS - 8  Chief Complaint  Patient presents with  . Rectal Bleeding       Brief Narrative   Raymond Fox is a 56 y.o. male with medical history significant of etoh abuse, gastric ulcer, duodenal ulcer, asthma, polysubstance abuse was admitted on 11/12/2016 for upper GI bleed.    Required 2 units of packed RBC on the day of admission, was kept on IV PPI, seen by GI, he underwent DTs which resolved on 11/18/2016, he is due for EGD on 11/20/2016. Currently has clinical suspicion of aspiration pneumonia.   Subjective:    Raymond Fox today Is in bed, mildly sleepy, no headache no SOB, no abd pain.   Assessment  & Plan :     1.Upper GI bleed. History of peptic ulcer disease in the past. Did develop blood loss related acute anemia, was transfused 2 units of packed RBC, now stable H&H, for now continue IV PPI , GI on board Is to undergo EGD on 11/20/2016.    2. Alcohol abuse with DTs. DTs have resolved, continue Librium along with Folic acid and thiamine supplementation. Counseled to quit alcohol.  3. High blood pressure. Likely due to #2 above. Placed on Catapres When necessary along with Norvasc and Lopressor scheduled .  4. Smoking. Counseled to quit. Neck or down patch.  5. Left basilar atelectasis. Added IS and flutter valve.  6. Impressive leukocytosis on 11/19/2016, chest x-ray portable remains unremarkable, I think he might have had mild aspiration while he was undergoing  DTs have placed him on Unasyn on 11/19/2016 with good response. Currently has mild cough but no shortness of breath or fever.  7.Gen weakness - SNF per PT, S work called.   Family Communication  :  Girlfriend bedside on 11/20/2016  Code Status :  Full  Diet : Diet NPO time specified Except  for: Sips with Meds   Disposition Plan  :  SNF on 11-21-16 if stable, S work called to arrange   Consults  :  GI  Procedures  :    EGD - 11/20/16 - pending   DVT Prophylaxis  :  SCDs   Lab Results  Component Value Date   PLT 579 (H) 11/20/2016    Inpatient Medications  Scheduled Meds: . amLODipine  10 mg Oral Daily  . ampicillin-sulbactam (UNASYN) IV  3 g Intravenous Q6H  . chlordiazePOXIDE  5 mg Oral TID  . folic acid  1 mg Oral Daily  . Influenza vac split quadrivalent PF  0.5 mL Intramuscular Tomorrow-1000  . metoprolol tartrate  50 mg Oral BID  . multivitamin with minerals  1 tablet Oral Daily  . nicotine  21 mg Transdermal Daily  . pantoprazole  40 mg Oral BID AC  . thiamine  100 mg Oral Daily   Continuous Infusions:  PRN Meds:.acetaminophen, cloNIDine, hydrALAZINE, metoprolol, [DISCONTINUED] ondansetron **OR** ondansetron (ZOFRAN) IV, polyethylene glycol  Antibiotics  :    Anti-infectives    Start     Dose/Rate Route Frequency Ordered Stop   11/19/16 1000  Ampicillin-Sulbactam (UNASYN) 3 g in sodium chloride 0.9 % 100 mL IVPB     3 g 200 mL/hr over 30 Minutes Intravenous Every 6 hours 11/19/16 0949     11/14/16 0915  cefTRIAXone (ROCEPHIN) 1 g in dextrose 5 % 50 mL IVPB  Status:  Discontinued     1 g 100 mL/hr over 30 Minutes Intravenous Every 24 hours 11/14/16 0906 11/14/16 0906   11/12/16 2200  piperacillin-tazobactam (ZOSYN) IVPB 3.375 g  Status:  Discontinued     3.375 g 12.5 mL/hr over 240 Minutes Intravenous Every 8 hours 11/12/16 2150 11/14/16 0906         Objective:   Vitals:   11/19/16 1800 11/19/16 2028 11/19/16 2130 11/20/16 0624  BP: 117/72 131/79   121/78  Pulse: 99 91  79  Resp: 19 20  18   Temp: 98 F (36.7 C) 98.5 F (36.9 C)  98.4 F (36.9 C)  TempSrc: Oral Oral  Oral  SpO2: 96% 96% 93% 95%  Weight:      Height:        Wt Readings from Last 3 Encounters:  11/19/16 50.5 kg (111 lb 5.3 oz)  12/07/12 61.1 kg (134 lb 9.6 oz)  11/02/12 60 kg (132 lb 4.4 oz)     Intake/Output Summary (Last 24 hours) at 11/20/16 0850 Last data filed at 11/20/16 0525  Gross per 24 hour  Intake              420 ml  Output              800 ml  Net             -380 ml     Physical Exam  Awake & Alert, No new F.N deficits,  [place].AT,PERRAL Supple Neck,No JVD, No cervical lymphadenopathy appriciated.  Symmetrical Chest wall movement, Good air movement bilaterally, few crackles at the bases RRR,No Gallops,Rubs or new Murmurs, No Parasternal Heave +ve B.Sounds, Abd Soft, No tenderness, No organomegaly appriciated, No rebound - guarding or rigidity. No Cyanosis, Clubbing or edema, No new Rash or bruise       Data Review:    CBC  Recent Labs Lab 11/16/16 0518 11/17/16 0625 11/18/16 0446 11/19/16 0528 11/20/16 0420  WBC 11.7* 11.5* 13.2* 18.5* 12.9*  HGB 10.7* 10.8*  10.7* 10.8* 11.0*  HCT 32.1* 32.6* 32.6* 33.9* 33.2*  PLT 506* 534* 550* 558* 579*  MCV 81.3 81.1 81.3 82.7 82.4  MCH 27.1 26.9 26.7 26.3 27.3  MCHC 33.3 33.1 32.8 31.9 33.1  RDW 19.2* 19.7* 19.7* 20.2* 19.9*    Chemistries   Recent Labs Lab 11/14/16 0414  11/16/16 0518 11/17/16 0625 11/18/16 0446 11/19/16 0528 11/20/16 0420  NA 131*  < > 134* 134* 134* 135 132*  K 3.8  < > 3.7 4.1 3.9 4.2 3.9  CL 98*  < > 104 103 102 101 98*  CO2 26  < > 23 23 23 25 24   GLUCOSE 76  < > 81 83 89 82 77  BUN 11  < > 10 7 14 13 13   CREATININE 0.87  < > 0.73 0.76 0.80 0.89 0.97  CALCIUM 8.5*  < > 8.4* 8.8* 8.8* 9.1 8.7*  MG 1.8  --   --   --   --   --   --   AST 17  < > 20 21 22 20 23   ALT 7*  < > 8* 8* 11* 9* 10*  ALKPHOS 70  < > 64 69 67 73 69  BILITOT 0.8  < > 0.4 0.3  0.5 0.4 0.4  < > = values in this interval not displayed. ------------------------------------------------------------------------------------------------------------------ No results for input(s): CHOL, HDL, LDLCALC, TRIG, CHOLHDL, LDLDIRECT in the last 72 hours.  No results found for: HGBA1C ------------------------------------------------------------------------------------------------------------------ No results for input(s): TSH, T4TOTAL, T3FREE, THYROIDAB in the last 72 hours.  Invalid input(s): FREET3 ------------------------------------------------------------------------------------------------------------------ No results for input(s): VITAMINB12, FOLATE, FERRITIN, TIBC, IRON, RETICCTPCT in the last 72 hours.  Coagulation profile No results for input(s): INR, PROTIME in the last 168 hours.  No results for input(s): DDIMER in the last 72 hours.  Cardiac Enzymes No results for input(s): CKMB, TROPONINI, MYOGLOBIN in the last 168 hours.  Invalid input(s): CK ------------------------------------------------------------------------------------------------------------------ No results found for: BNP  Micro Results Recent Results (from the past 240 hour(s))  MRSA PCR Screening     Status: None   Collection Time: 11/12/16  9:27 PM  Result Value Ref Range Status   MRSA by PCR NEGATIVE NEGATIVE Final    Comment:        The GeneXpert MRSA Assay (FDA approved for NASAL specimens only), is one component of a comprehensive MRSA colonization surveillance program. It is not intended to diagnose MRSA infection nor to guide or monitor treatment for MRSA infections.     Radiology Reports Dg Chest 2 View  Result Date: 11/12/2016 CLINICAL DATA:  Bright red blood in stool.  Abdominal pain. EXAM: CHEST  2 VIEW COMPARISON:  02/04/2016 FINDINGS: Multiple old left rib fractures. New densities at the left costophrenic angle. Evidence for lung hyperinflation. Heart and mediastinum are  within normal limits. Again noted is a compression fracture in the mid thoracic spine. No large pleural effusions. IMPRESSION: Chronic hyperinflation with new left basilar densities. Findings could represent atelectasis. Electronically Signed   By: Richarda OverlieAdam  Henn M.D.   On: 11/12/2016 18:56   Ct Abdomen Pelvis W Contrast  Result Date: 11/12/2016 CLINICAL DATA:  56 year old male with history of hematochezia at noon today. Abdominal pain for the past several days, worsening today. History of bleeding ulcer. EXAM: CT ABDOMEN AND PELVIS WITH CONTRAST TECHNIQUE: Multidetector CT imaging of the abdomen and pelvis was performed using the standard protocol following bolus administration of intravenous contrast. CONTRAST:  100mL ISOVUE-300 IOPAMIDOL (ISOVUE-300) INJECTION 61% COMPARISON:  CT the abdomen and pelvis 10/29/2012. FINDINGS: Lower chest: Scattered areas of mild cylindrical bronchiectasis are noted in the visualize lung bases, similar to prior studies. Hepatobiliary: No cystic or solid hepatic lesions. No intra or extrahepatic biliary ductal dilatation. Gallbladder is unremarkable in appearance. Pancreas: No pancreatic mass. No pancreatic ductal dilatation. No pancreatic or peripancreatic fluid or inflammatory changes. Spleen: Status post splenectomy, with a large splenule immediately medial to the left kidney, similar to the prior study. Adrenals/Urinary Tract: Bilateral kidneys and bilateral adrenal glands are normal in appearance. No hydroureteronephrosis. Urinary bladder is unremarkable in appearance (partially obscured by beam hardening artifact from the patient's right hip arthroplasty). Stomach/Bowel: The appearance of the stomach is normal. There is no pathologic dilatation of small bowel or colon. The appendix is not confidently identified and may be surgically absent. Regardless, there are no inflammatory changes noted adjacent to the cecum to suggest the presence of an acute appendicitis at this time.  Vascular/Lymphatic: Aortic atherosclerosis, without evidence of aneurysm or dissection in the abdominal or pelvic vasculature. No lymphadenopathy noted in the abdomen or pelvis. Reproductive: Prostate gland seminal vesicles are unremarkable in appearance. Other: No significant volume of ascites.  No pneumoperitoneum. Musculoskeletal: Status post PLIF at L5-S1 with interbody graft at L5-S1 interspace. There are no aggressive appearing lytic or blastic lesions noted in the visualized portions of the skeleton. Status post right hip arthroplasty. IMPRESSION: 1. No acute findings in the abdomen or pelvis to account for the patient's symptoms. 2. Scattered areas of cylindrical bronchiectasis are again noted in the lung bases bilaterally, chronic and similar to prior examinations. 3. Aortic atherosclerosis. 4. Additional incidental findings, as above. Electronically Signed   By: Trudie Reed M.D.   On: 11/12/2016 18:41   Dg Chest Port 1 View  Result Date: 11/19/2016 CLINICAL DATA:  56 year old male with shortness of breath. Initial encounter. EXAM: PORTABLE CHEST 1 VIEW COMPARISON:  11/15/2016 and earlier. FINDINGS: Portable AP upright view at 0705 hours. Lung volumes remain stable. Mediastinal contours remain normal. Visualized tracheal air column is within normal limits. Multilevel chronic left lateral rib fractures. Allowing for portable technique the lungs are clear. No pneumothorax or pleural effusion. IMPRESSION: No acute cardiopulmonary abnormality. Electronically Signed   By: Odessa Fleming M.D.   On: 11/19/2016 07:47   Dg Chest Port 1 View  Result Date: 11/15/2016 CLINICAL DATA:  Shortness of Breath, asthma, hypertension. EXAM: PORTABLE CHEST 1 VIEW COMPARISON:  11/12/2016 FINDINGS: There is hyperinflation of the lungs compatible with COPD. Left basilar atelectasis or infiltrate again noted, unchanged. Right lung is clear. Heart is normal size. No visible effusion or acute bony abnormality. IMPRESSION:  Hyperinflation/COPD.  Continued left base atelectasis or infiltrate. Electronically Signed   By: Charlett Nose M.D.   On: 11/15/2016 09:19    Time Spent in minutes  30   Clemons Salvucci K M.D on 11/20/2016 at 8:50 AM  Between 7am to 7pm - Pager - [phone]  After 7pm go to [email or web address] - password [facility]  [facility] -  Office  [phone]

## 2016-11-20 NOTE — Progress Notes (Signed)
Pt in bed sleeping with call light within reach.  Door open and bed alarm on. Nothing needed at this time.

## 2016-11-20 NOTE — Progress Notes (Signed)
Pt refused to walked did not want to get out of bed. Pt started to become upset. Iv infusing, bed alarm on and side rails up. Nothing needed at this time.

## 2016-11-20 NOTE — [place] FL2 (Signed)
[place] MEDICAID FL2 LEVEL OF CARE SCREENING TOOL     IDENTIFICATION  Patient Name: Raymond Fox Birthdate: [birth year] Sex: male Admission Date (Current Location): 11/12/2016  [facility] [place]   Facility and Address:  [facility],  [address], [place] [id number]      Provider Number: [id number]  Attending Physician Name and Address:  Leroy SeaPrashant K Singh, MD  Relative Name and Phone Number:       Current Level of Care: Hospital Recommended Level of Care: Skilled Nursing Facility Prior Approval Number:    Date Approved/Denied:   PASRR Number: [id number] ([id number])  Discharge Plan: SNF    Current Diagnoses: Patient Active Problem List   Diagnosis Date Noted  . Hematemesis without nausea   . GI bleeding 11/12/2016  . Anemia 12/07/2012  . Elevated LFTs 12/07/2012  . Alcoholic encephalopathy (HCC) 84/69/629501/14/2014  . Helicobacter pylori antibody positive 11/03/2012  . Tachycardia 11/02/2012  . Hypokalemia 11/01/2012  . History of splenectomy 11/01/2012  . Hyponatremia 11/01/2012  . Alcoholic hepatitis 11/01/2012  . Alcohol abuse 11/01/2012  . h/o Bleeding duodenal ulcer 11/01/2012  . h/o Gastric ulcer 11/01/2012  . Hypotension 11/01/2012  . Alcohol withdrawal syndrome (HCC) 11/01/2012  . Anemia associated with acute blood loss 11/01/2012  . GI bleed 10/29/2012  . Community acquired pneumonia 10/29/2012  . Tobacco abuse 10/29/2012    Orientation RESPIRATION BLADDER Height & Weight     Self  Normal Continent Weight: 111 lb (50.3 kg) Height:  5\' 6"  (167.6 cm)  BEHAVIORAL SYMPTOMS/MOOD NEUROLOGICAL BOWEL NUTRITION STATUS      Continent Diet (See discharge summary, patient is currently NPO)  AMBULATORY STATUS COMMUNICATION OF NEEDS Skin   Limited Assist Verbally Normal                       Personal Care Assistance Level of Assistance  Bathing, Feeding, Dressing Bathing Assistance: Limited assistance Feeding  assistance: Independent Dressing Assistance: Limited assistance     Functional Limitations Info  Hearing, Speech, Sight Sight Info: Adequate Hearing Info: Adequate Speech Info: Adequate    SPECIAL CARE FACTORS FREQUENCY  PT (By licensed PT)     PT Frequency: 5x/week              Contractures Contractures Info: Not present    Additional Factors Info  Code Status, Allergies Code Status Info: Full Allergies Info: Penicillins           Current Medications (11/20/2016):  This is the current hospital active medication list Current Facility-Administered Medications  Medication Dose Route Frequency Provider Last Rate Last Dose  . [MAR Hold] acetaminophen (TYLENOL) tablet 650 mg  650 mg Oral Q6H PRN Leroy SeaPrashant K Singh, MD      . Mitzi Hansen[MAR Hold] amLODipine (NORVASC) tablet 10 mg  10 mg Oral Daily Leroy SeaPrashant K Singh, MD   10 mg at 11/20/16 1045  . [MAR Hold] Ampicillin-Sulbactam (UNASYN) 3 g in sodium chloride 0.9 % 100 mL IVPB  3 g Intravenous Q6H Leroy SeaPrashant K Singh, MD   3 g at 11/20/16 0412  . [MAR Hold] chlordiazePOXIDE (LIBRIUM) capsule 5 mg  5 mg Oral TID Leroy SeaPrashant K Singh, MD   5 mg at 11/20/16 1045  . [MAR Hold] cloNIDine (CATAPRES) tablet 0.1 mg  0.1 mg Oral TID PRN Leroy SeaPrashant K Singh, MD      . Mitzi Hansen[MAR Hold] folic acid (FOLVITE) tablet 1 mg  1 mg Oral Daily Haydee Monicaachal A David, MD  1 mg at 11/20/16 1045  . [MAR Hold] hydrALAZINE (APRESOLINE) injection 10 mg  10 mg Intravenous Q6H PRN Leroy Sea, MD   10 mg at 11/15/16 2222  . [MAR Hold] Influenza vac split quadrivalent PF (FLUARIX) injection 0.5 mL  0.5 mL Intramuscular Tomorrow-1000 Haydee Monica, MD   Stopped at 11/13/16 [phone]  . [MAR Hold] metoprolol (LOPRESSOR) injection 5 mg  5 mg Intravenous Q4H PRN Leroy Sea, MD      . Mitzi Hansen Hold] metoprolol (LOPRESSOR) tablet 50 mg  50 mg Oral BID Leroy Sea, MD   50 mg at 11/20/16 1045  . [MAR Hold] multivitamin with minerals tablet 1 tablet  1 tablet Oral Daily Haydee Monica, MD   1  tablet at 11/20/16 1045  . [MAR Hold] nicotine (NICODERM CQ - dosed in mg/24 hours) patch 21 mg  21 mg Transdermal Daily Leroy Sea, MD   21 mg at 11/20/16 1050  . [MAR Hold] ondansetron (ZOFRAN) injection 4 mg  4 mg Intravenous Q6H PRN Haydee Monica, MD      . Mitzi Hansen Hold] pantoprazole (PROTONIX) EC tablet 40 mg  40 mg Oral BID AC West Bali, MD   40 mg at 11/20/16 1045  . [MAR Hold] polyethylene glycol (MIRALAX / GLYCOLAX) packet 17 g  17 g Oral Daily PRN Gelene Mink, NP   17 g at 11/18/16 1333  . [MAR Hold] thiamine (VITAMIN B-1) tablet 100 mg  100 mg Oral Daily Haydee Monica, MD   100 mg at 11/20/16 1045     Discharge Medications: Please see discharge summary for a list of discharge medications.  Relevant Imaging Results:  Relevant Lab Results:   Additional Information SSN [id number], Juleen China, LCSW

## 2016-11-20 NOTE — Anesthesia Preprocedure Evaluation (Signed)
Anesthesia Evaluation  Patient identified by MRN, date of birth, ID band Patient awake    Reviewed: Allergy & Precautions, NPO status , Patient's Chart, lab work & pertinent test results  Airway Mallampati: I       Dental  (+) Edentulous Upper, Poor Dentition   Pulmonary asthma , Current Smoker,    breath sounds clear to auscultation       Cardiovascular hypertension,  Rhythm:Regular Rate:Normal     Neuro/Psych Hx of alcoholic encephalopathy and withdrawal syndrome     GI/Hepatic PUD, (+) Hepatitis -  Endo/Other    Renal/GU      Musculoskeletal   Abdominal   Peds  Hematology   Anesthesia Other Findings   Reproductive/Obstetrics                             Anesthesia Physical Anesthesia Plan  ASA: III  Anesthesia Plan: MAC   Post-op Pain Management:    Induction: Intravenous  Airway Management Planned: Simple Face Mask  Additional Equipment:   Intra-op Plan:   Post-operative Plan:   Informed Consent: I have reviewed the patients History and Physical, chart, labs and discussed the procedure including the risks, benefits and alternatives for the proposed anesthesia with the patient or authorized representative who has indicated his/her understanding and acceptance.     Plan Discussed with:   Anesthesia Plan Comments:         Anesthesia Quick Evaluation

## 2016-11-20 NOTE — Progress Notes (Signed)
Pt ambulated with assistance to recliner chair.

## 2016-11-20 NOTE — Clinical Social Work Note (Signed)
Clinical Social Work Assessment  Patient Details  Name: Raymond Fox MRN: [id number] Date of Birth: 05-03-1961  Date of referral:  11/20/16               Reason for consult:  Discharge Planning                Permission sought to share information with:    Permission granted to share information::     Name::        Agency::     Relationship::     Contact Information:  Girlfriend, Nettie ElmSylvia, was at bedside.  Housing/Transportation Living arrangements for the past 2 months:  Single Family Home Source of Information:  Patient Patient Interpreter Needed:  None Criminal Activity/Legal Involvement Pertinent to Current Situation/Hospitalization:  No - Comment as needed Significant Relationships:  Other Family Members, Parents, Significant Other Lives with:  Significant Other Do you feel safe going back to the place where you live?  Yes Need for family participation in patient care:  Yes (Comment)  Care giving concerns:  None identified at baseline.    Social Worker assessment / plan:  Patient lives with girlfriend, Nettie ElmSylvia. At baseline, patient uses a cane and is independent in ADLs. [place] SNF in [place].   Employment status:  Disabled (Comment on whether or not currently receiving Disability) Insurance information:  Managed Medicare PT Recommendations:  Skilled Nursing Facility, 24 Hour Supervision Information / Referral to community resources:  Skilled Nursing Facility  Patient/Family's Response to care:  Significant other is agreeable to SNF.   Patient/Family's Understanding of and Emotional Response to Diagnosis, Current Treatment, and Prognosis: Patient is oriented to self. Patient's spouse understands diagnosis, treatment and prognosis.   Emotional Assessment Appearance:  Appears stated age Attitude/Demeanor/Rapport:  Lethargic (Cooperative) Affect (typically observed):  Accepting Orientation:  Oriented to Self Alcohol / Substance use:  Alcohol Use Psych involvement  (Current and /or in the community):  No (Comment)  Discharge Needs  Concerns to be addressed:  Discharge Planning Concerns Readmission within the last 30 days:  No Current discharge risk:  None Barriers to Discharge:  No Barriers Identified   Annice NeedySettle, Janazia Schreier D, LCSW 11/20/2016, 12:41 PM

## 2016-11-20 NOTE — Anesthesia Postprocedure Evaluation (Signed)
Anesthesia Post Note  Patient: Raymond Fox  Procedure(s) Performed: Procedure(s) (LRB): ESOPHAGOGASTRODUODENOSCOPY (EGD) WITH PROPOFOL (N/A)  Patient location during evaluation: PACU Anesthesia Type: MAC Level of consciousness: awake, oriented and patient cooperative Pain management: pain level controlled Vital Signs Assessment: post-procedure vital signs reviewed and stable Respiratory status: spontaneous breathing and patient connected to face mask oxygen Cardiovascular status: stable Postop Assessment: no signs of nausea or vomiting Anesthetic complications: no     Last Vitals:  Vitals:   11/20/16 1405 11/20/16 1410  BP: 108/69 100/67  Pulse:    Resp: 17 (!) 29  Temp:      Last Pain:  Vitals:   11/20/16 0624  TempSrc: Oral  PainSc:                  ADAMS, AMY A

## 2016-11-20 NOTE — Progress Notes (Signed)
Pt tried to get out of bed and was some what agitated. Pt in bed with side rails up and bed alarm on.

## 2016-11-20 NOTE — Progress Notes (Signed)
Patient conversant. Alert and oriented to person and place. WBC down today. Chest xray yesterday without acute disease.   Plan for EGD in OR today.   Leanna BattlesLeslie S. Dixon BoosLewis, PA-C [facility] [phone] 1/31/20188:38 AM

## 2016-11-20 NOTE — Transfer of Care (Signed)
Immediate Anesthesia Transfer of Care Note  Patient: Raymond Fox  Procedure(s) Performed: Procedure(s): ESOPHAGOGASTRODUODENOSCOPY (EGD) WITH PROPOFOL (N/A)  Patient Location: PACU  Anesthesia Type:MAC  Level of Consciousness: awake and oriented  Airway & Oxygen Therapy: Patient Spontanous Breathing and Patient connected to face mask oxygen  Post-op Assessment: Report given to RN and Post -op Vital signs reviewed and stable  Post vital signs: Reviewed and stable  Last Vitals:  Vitals:   11/20/16 1405 11/20/16 1410  BP: 108/69 100/67  Pulse:    Resp: 17 (!) 29  Temp:      Last Pain:  Vitals:   11/20/16 0624  TempSrc: Oral  PainSc:       Patients Stated Pain Goal: 2 (97/41/63 8453)  Complications: No apparent anesthesia complications

## 2016-11-21 ENCOUNTER — Telehealth: Payer: Self-pay | Admitting: Gastroenterology

## 2016-11-21 DIAGNOSIS — K259 Gastric ulcer, unspecified as acute or chronic, without hemorrhage or perforation: Secondary | ICD-10-CM | POA: Diagnosis not present

## 2016-11-21 DIAGNOSIS — I7389 Other specified peripheral vascular diseases: Secondary | ICD-10-CM | POA: Diagnosis not present

## 2016-11-21 DIAGNOSIS — F10239 Alcohol dependence with withdrawal, unspecified: Secondary | ICD-10-CM | POA: Diagnosis not present

## 2016-11-21 DIAGNOSIS — D509 Iron deficiency anemia, unspecified: Secondary | ICD-10-CM | POA: Diagnosis not present

## 2016-11-21 DIAGNOSIS — F10230 Alcohol dependence with withdrawal, uncomplicated: Secondary | ICD-10-CM | POA: Diagnosis not present

## 2016-11-21 DIAGNOSIS — F1010 Alcohol abuse, uncomplicated: Secondary | ICD-10-CM | POA: Diagnosis not present

## 2016-11-21 DIAGNOSIS — Z720 Tobacco use: Secondary | ICD-10-CM | POA: Diagnosis not present

## 2016-11-21 DIAGNOSIS — K922 Gastrointestinal hemorrhage, unspecified: Secondary | ICD-10-CM | POA: Diagnosis not present

## 2016-11-21 DIAGNOSIS — D62 Acute posthemorrhagic anemia: Secondary | ICD-10-CM | POA: Diagnosis not present

## 2016-11-21 DIAGNOSIS — M6281 Muscle weakness (generalized): Secondary | ICD-10-CM | POA: Diagnosis not present

## 2016-11-21 DIAGNOSIS — K264 Chronic or unspecified duodenal ulcer with hemorrhage: Secondary | ICD-10-CM | POA: Diagnosis not present

## 2016-11-21 DIAGNOSIS — I10 Essential (primary) hypertension: Secondary | ICD-10-CM | POA: Diagnosis not present

## 2016-11-21 DIAGNOSIS — R41840 Attention and concentration deficit: Secondary | ICD-10-CM | POA: Diagnosis not present

## 2016-11-21 DIAGNOSIS — G4089 Other seizures: Secondary | ICD-10-CM | POA: Diagnosis not present

## 2016-11-21 DIAGNOSIS — R41841 Cognitive communication deficit: Secondary | ICD-10-CM | POA: Diagnosis not present

## 2016-11-21 DIAGNOSIS — R278 Other lack of coordination: Secondary | ICD-10-CM | POA: Diagnosis not present

## 2016-11-21 LAB — COMPREHENSIVE METABOLIC PANEL
ALK PHOS: 72 U/L (ref 38–126)
ALT: 9 U/L — AB (ref 17–63)
ANION GAP: 7 (ref 5–15)
AST: 21 U/L (ref 15–41)
Albumin: 3.1 g/dL — ABNORMAL LOW (ref 3.5–5.0)
BILIRUBIN TOTAL: 0.6 mg/dL (ref 0.3–1.2)
BUN: 16 mg/dL (ref 6–20)
CALCIUM: 8.5 mg/dL — AB (ref 8.9–10.3)
CO2: 26 mmol/L (ref 22–32)
CREATININE: 0.87 mg/dL (ref 0.61–1.24)
Chloride: 101 mmol/L (ref 101–111)
Glucose, Bld: 84 mg/dL (ref 65–99)
Potassium: 4 mmol/L (ref 3.5–5.1)
Sodium: 134 mmol/L — ABNORMAL LOW (ref 135–145)
TOTAL PROTEIN: 6.5 g/dL (ref 6.5–8.1)

## 2016-11-21 LAB — CBC
HCT: 30.8 % — ABNORMAL LOW (ref 39.0–52.0)
HEMOGLOBIN: 10.2 g/dL — AB (ref 13.0–17.0)
MCH: 27.5 pg (ref 26.0–34.0)
MCHC: 33.1 g/dL (ref 30.0–36.0)
MCV: 83 fL (ref 78.0–100.0)
Platelets: 584 10*3/uL — ABNORMAL HIGH (ref 150–400)
RBC: 3.71 MIL/uL — ABNORMAL LOW (ref 4.22–5.81)
RDW: 19.9 % — ABNORMAL HIGH (ref 11.5–15.5)
WBC: 11.8 10*3/uL — ABNORMAL HIGH (ref 4.0–10.5)

## 2016-11-21 MED ORDER — LORAZEPAM 1 MG PO TABS: 1.0000 mg | ORAL_TABLET | Freq: Three times a day (TID) | ORAL | 0 refills | Status: AC | PRN

## 2016-11-21 MED ORDER — FERROUS SULFATE 325 (65 FE) MG PO TABS: 325.0000 mg | ORAL_TABLET | Freq: Three times a day (TID) | ORAL | Status: DC

## 2016-11-21 MED ORDER — ADULT MULTIVITAMIN W/MINERALS CH: 1.0000 | ORAL_TABLET | Freq: Every day | ORAL | 0 refills | Status: AC

## 2016-11-21 MED ORDER — FOLIC ACID 1 MG PO TABS: 1.0000 mg | ORAL_TABLET | Freq: Every day | ORAL | 0 refills | Status: AC

## 2016-11-21 MED ORDER — PANTOPRAZOLE SODIUM 40 MG PO TBEC: 40.0000 mg | DELAYED_RELEASE_TABLET | Freq: Two times a day (BID) | ORAL | 0 refills | Status: AC

## 2016-11-21 MED ORDER — THIAMINE HCL 100 MG PO TABS: 100.0000 mg | ORAL_TABLET | Freq: Every day | ORAL | 0 refills | Status: AC

## 2016-11-21 MED ORDER — FERROUS SULFATE 325 (65 FE) MG PO TABS: 325.0000 mg | ORAL_TABLET | Freq: Three times a day (TID) | ORAL | 0 refills | Status: AC

## 2016-11-21 MED ORDER — NICOTINE 21 MG/24HR TD PT24: 21.0000 mg | MEDICATED_PATCH | Freq: Every day | TRANSDERMAL | 0 refills | Status: AC

## 2016-11-21 NOTE — Discharge Summary (Addendum)
Physician Discharge Summary  Raymond Fox:[id number]:[id number] DOB: 1961-10-19 DOA: 11/12/2016  PCP: Raymond Fox  Admit date: 11/12/2016 Discharge date: 11/21/2016  Admitted From: Home  Disposition:  SNF  Recommendations for Outpatient Follow-up:  1. Follow up with PCP in 1- weeks 2.   Patient has been placed on BID pantoprazole for the next 2 weeks 3.   Use as needed lorazepam for anxiety.   Home Health: No  Equipment/Devices: No   Discharge Condition: Stable CODE STATUS:Full  Diet recommendation: Heart Healthy  Brief/Interim Summary: This is a 56 yo male with significant history of alcohol abuse, gastric/ duodenal ulcers, and polysubstance abuse, who presents with chief complaint of abdominal pain and melena for the last 2 days prior to hospitalization, symptoms were associated with severe nausea. On his initial physical examination he was found to be hypertensive 164/106, heart rate was 134, with oxygen saturation 92%. His mucous membranes were moist, his lungs were clear to auscultation bilaterally, heart S1-S2 present and rhythmic, his abdomen was tender in the  epigastric region with no rebound or guarding, lower extremities no edema. Sodium 131, potassium 4.3, chloride 97, bicarbonate 24, glucose 116, but 41, creatinine 0.91, AST 16, NT 9, total bilirubin 0.4, iron 32, TIBC 314, transferrin saturation 10 with fertility 35, white count 21.8, hemoglobin 9.7 (13.8 on 11/2012), hematocrit 30.0, platelets 511. Fecal occult blood was positive, alcohol level less than 5. CT of the abdomen and pelvis show no acute findings, scattered areas of cylindrical bronchiectasis at the base of the lungs. He is chest film show hyperinflation with no effusions, infiltrates or pneumothorax. UDS was positive for benzodiazepines and opiates. EKG was sinus tachycardia with poor R-wave progression.  The patient was admitted to the hospital with acute blood loss anemia due to upper GI bleed, complicated by  leukocytosis.  1. Upper GI bleed with acute blood loss anemia. Repeat hemoglobin was 7.8, patient was transfused 2 packed red blood cells. He was started on IV proton pump inhibitors. He was placed nothing by mouth and was intervened by gastroenterology with upper endoscopy. Impression of normal esophagus, small hiatal hernia, gastric erosions, duodenal ulcer with associated erosions. Biopsies were taken and recommendations to continue proton pump inhibitors twice daily, follow-up in the GI clinic in 12 weeks. Patient's hemoglobin and hematocrit remained stable between 10-11/ 31-33. Diet has been advanced with good toleration.  2. Alcohol abuse with withdrawal symptoms. Patient was placed on benzodiazepines, vitamins including folic acid and thiamine. He used long-acting benzodiazepine with Librium while in hospital. By the time of his discharge his withdrawal symptoms have improved, will recommend to continue only as needed lorazepam.  3. Transitory reactive hypertension. He was presumed to be related to withdrawal symptoms. Patient was on amlodipine and metoprolol in the hospital. His blood pressure has been 100-119 systolic. Will recommend close follow-up, will hold antihypertensive agents for now.   4. Reactive leukocytosis. His x-rays remain negative for infiltrates, positive atelectasis. He received short course of antibiotics in the hospital. Will recommend continue smoking cessation, will hold antibiotics for now, follow-up as an outpatient. Need outpatient PFTs, noted bronchiectasis on CT scan.   5. Tobacco abuse. Smoking cessation. Continue nicotine patch.  6. Generalized weakness and deconditioning. Patient was evaluated by physical therapy with recommendations for skilled nursing facility at discharge.   7. Iron deficiency anemia. Iron stores consistent with iron deficiency, transferrin saturation 10%, ferritin 32 and TIBC 314. Patient will be placed on ferrous sulfate 3 times daily,  follow-up on  iron stores in 4 weeks.     Discharge Diagnoses:  Principal Problem:   GI bleeding Active Problems:   History of splenectomy   Alcohol abuse   h/o Bleeding duodenal ulcer   Gastric erosion   Alcohol withdrawal syndrome (HCC)   Anemia associated with acute blood loss   Hematemesis without nausea   Melena    Discharge Instructions   Allergies as of 11/21/2016      Reactions   Penicillins Rash   Has patient had a PCN reaction causing immediate rash, facial/tongue/throat swelling, SOB or lightheadedness with hypotension: Yes Has patient had a PCN reaction causing severe rash involving mucus membranes or skin necrosis: No Has patient had a PCN reaction that required hospitalization No Tolerated zosyn for 3 days without problems 1/23 Has patient had a PCN reaction occurring within the last 10 years: No If all of the above answers are "NO", then may proceed with Cephalosporin use. 1      Medication List    STOP taking these medications   EXCEDRIN EXTRA STRENGTH 250-250-65 MG tablet Generic drug:  aspirin-acetaminophen-caffeine     TAKE these medications   acetaminophen 500 MG tablet Commonly known as:  TYLENOL Take 500 mg by mouth every 6 (six) hours as needed.   ferrous sulfate 325 (65 FE) MG tablet Take 1 tablet (325 mg total) by mouth 3 (three) times daily with meals.   folic acid 1 MG tablet Commonly known as:  FOLVITE Take 1 tablet (1 mg total) by mouth daily. Start taking on:  11/22/2016   LORazepam 1 MG tablet Commonly known as:  ATIVAN Take 1 tablet (1 mg total) by mouth 3 (three) times daily as needed for anxiety.   multivitamin with minerals Tabs tablet Take 1 tablet by mouth daily. Start taking on:  11/22/2016   nicotine 21 mg/24hr patch Commonly known as:  NICODERM CQ - dosed in mg/24 hours Place 1 patch (21 mg total) onto the skin daily. Start taking on:  11/22/2016   pantoprazole 40 MG tablet Commonly known as:  PROTONIX Take 1 tablet  (40 mg total) by mouth 2 (two) times daily before a meal.   thiamine 100 MG tablet Take 1 tablet (100 mg total) by mouth daily. Start taking on:  11/22/2016       Contact information for follow-up providers    Tana Coast, Fox Follow up on 02/10/2017.   Specialty:  Gastroenterology Why:  at 10:30 am Contact information: [address] [place] [address] [phone]        MUSE,ROCHELLE D., Fox Follow up in 1 week(s).   Contact information: [address] [place] [address] [phone]            Contact information for after-discharge care    Destination    [facility] Follow up.   Specialty:  Skilled Nursing Facility Contact information: [address] [place] [address] [phone]                 Allergies  Allergen Reactions  . Penicillins Rash    Has patient had a PCN reaction causing immediate rash, facial/tongue/throat swelling, SOB or lightheadedness with hypotension: Yes Has patient had a PCN reaction causing severe rash involving mucus membranes or skin necrosis: No Has patient had a PCN reaction that required hospitalization No  Tolerated zosyn for 3 days without problems 1/23 Has patient had a PCN reaction occurring within  the last 10 years: No If all of the above answers are "NO", then may proceed with Cephalosporin use. 1    Consultations:  Gastroeneterology   Procedures/Studies: Dg Chest 2 View  Result Date: 11/12/2016 CLINICAL DATA:  Bright red blood in stool.  Abdominal pain. EXAM: CHEST  2 VIEW COMPARISON:  02/04/2016 FINDINGS: Multiple old left rib fractures. New densities at the left costophrenic angle. Evidence for lung hyperinflation. Heart and mediastinum are within normal limits. Again noted is a compression fracture in the mid thoracic spine. No large pleural effusions. IMPRESSION: Chronic hyperinflation with new left basilar densities. Findings  could represent atelectasis. Electronically Signed   By: Richarda Overlie M.D.   On: 11/12/2016 18:56   Ct Abdomen Pelvis W Contrast  Result Date: 11/12/2016 CLINICAL DATA:  56 year old male with history of hematochezia at noon today. Abdominal pain for the past several days, worsening today. History of bleeding ulcer. EXAM: CT ABDOMEN AND PELVIS WITH CONTRAST TECHNIQUE: Multidetector CT imaging of the abdomen and pelvis was performed using the standard protocol following bolus administration of intravenous contrast. CONTRAST:  ISOVUE-300 IOPAMIDOL (ISOVUE-300) INJECTION 61% COMPARISON:  CT the abdomen and pelvis 10/29/2012. FINDINGS: Lower chest: Scattered areas of mild cylindrical bronchiectasis are noted in the visualize lung bases, similar to prior studies. Hepatobiliary: No cystic or solid hepatic lesions. No intra or extrahepatic biliary ductal dilatation. Gallbladder is unremarkable in appearance. Pancreas: No pancreatic mass. No pancreatic ductal dilatation. No pancreatic or peripancreatic fluid or inflammatory changes. Spleen: Status post splenectomy, with a large splenule immediately medial to the left kidney, similar to the prior study. Adrenals/Urinary Tract: Bilateral kidneys and bilateral adrenal glands are normal in appearance. No hydroureteronephrosis. Urinary bladder is unremarkable in appearance (partially obscured by beam hardening artifact from the patient's right hip arthroplasty). Stomach/Bowel: The appearance of the stomach is normal. There is no pathologic dilatation of small bowel or colon. The appendix is not confidently identified and may be surgically absent. Regardless, there are no inflammatory changes noted adjacent to the cecum to suggest the presence of an acute appendicitis at this time. Vascular/Lymphatic: Aortic atherosclerosis, without evidence of aneurysm or dissection in the abdominal or pelvic vasculature. No lymphadenopathy noted in the abdomen or pelvis. Reproductive:  Prostate gland seminal vesicles are unremarkable in appearance. Other: No significant volume of ascites.  No pneumoperitoneum. Musculoskeletal: Status post PLIF at L5-S1 with interbody graft at L5-S1 interspace. There are no aggressive appearing lytic or blastic lesions noted in the visualized portions of the skeleton. Status post right hip arthroplasty. IMPRESSION: 1. No acute findings in the abdomen or pelvis to account for the patient's symptoms. 2. Scattered areas of cylindrical bronchiectasis are again noted in the lung bases bilaterally, chronic and similar to prior examinations. 3. Aortic atherosclerosis. 4. Additional incidental findings, as above. Electronically Signed   By: Trudie Reed M.D.   On: 11/12/2016 18:41   Dg Chest Port 1 View  Result Date: 11/19/2016 CLINICAL DATA:  56 year old male with shortness of breath. Initial encounter. EXAM: PORTABLE CHEST 1 VIEW COMPARISON:  11/15/2016 and earlier. FINDINGS: Portable AP upright view at 0705 hours. Lung volumes remain stable. Mediastinal contours remain normal. Visualized tracheal air column is within normal limits. Multilevel chronic left lateral rib fractures. Allowing for portable technique the lungs are clear. No pneumothorax or pleural effusion. IMPRESSION: No acute cardiopulmonary abnormality. Electronically Signed   By: Odessa Fleming M.D.   On: 11/19/2016 07:47   Dg Chest Port 1 View  Result Date: 11/15/2016 CLINICAL  DATA:  Shortness of Breath, asthma, hypertension. EXAM: PORTABLE CHEST 1 VIEW COMPARISON:  11/12/2016 FINDINGS: There is hyperinflation of the lungs compatible with COPD. Left basilar atelectasis or infiltrate again noted, unchanged. Right lung is clear. Heart is normal size. No visible effusion or acute bony abnormality. IMPRESSION: Hyperinflation/COPD.  Continued left base atelectasis or infiltrate. Electronically Signed   By: Charlett Nose M.D.   On: 11/15/2016 09:19    (Echo, Carotid, EGD, Colonoscopy, ERCP)     Subjective: Patient feeling better, no nausea or vomiting, tolerating po well, no anxiety or tremors, ambulating with difficulty, positive generalized weakness.   Discharge Exam: Vitals:   11/20/16 2135 11/21/16 0626  BP: 104/65 119/75  Pulse: (!) 109 96  Resp: 18 18  Temp: 98.8 F (37.1 C) 98.2 F (36.8 C)   Vitals:   11/20/16 1513 11/20/16 2135 11/20/16 2142 11/21/16 0626  BP: 115/77 104/65  119/75  Pulse:  (!) 109  96  Resp: 20 18  18   Temp:  98.8 F (37.1 C)  98.2 F (36.8 C)  TempSrc:  Oral  Oral  SpO2: 96% 95% 92% 92%  Weight:      Height:        General: Pt is alert, awake, not in acute distress, deconditioned Cardiovascular: RRR, S1/S2 +, no rubs, no gallops Respiratory: CTA bilaterally, no wheezing, positive scattered rhonchi Abdominal: Soft, NT, ND, bowel sounds + Extremities: no edema, no cyanosis    The results of significant diagnostics from this hospitalization (including imaging, microbiology, ancillary and laboratory) are listed below for reference.     Microbiology: Recent Results (from the past 240 hour(s))  MRSA PCR Screening     Status: None   Collection Time: 11/12/16  9:27 PM  Result Value Ref Range Status   MRSA by PCR NEGATIVE NEGATIVE Final    Comment:        The GeneXpert MRSA Assay (FDA approved for NASAL specimens only), is one component of a comprehensive MRSA colonization surveillance program. It is not intended to diagnose MRSA infection nor to guide or monitor treatment for MRSA infections.      Labs: BNP (last 3 results) No results for input(s): BNP in the last 8760 hours. Basic Metabolic Panel:  Recent Labs Lab 11/17/16 0625 11/18/16 0446 11/19/16 0528 11/20/16 0420 11/21/16 0615  NA 134* 134* 135 132* 134*  K 4.1 3.9 4.2 3.9 4.0  CL 103 102 101 98* 101  CO2 23 23 25 24 26   GLUCOSE 83 89 82 77 84  BUN 7 14 13 13 16   CREATININE 0.76 0.80 0.89 0.97 0.87  CALCIUM 8.8* 8.8* 9.1 8.7* 8.5*   Liver Function  Tests:  Recent Labs Lab 11/17/16 0625 11/18/16 0446 11/19/16 0528 11/20/16 0420 11/21/16 0615  AST 21 22 20 23 21   ALT 8* 11* 9* 10* 9*  ALKPHOS 69 67 73 69 72  BILITOT 0.3 0.5 0.4 0.4 0.6  PROT 7.1 7.0 7.0 6.9 6.5  ALBUMIN 3.3* 3.3* 3.3* 3.3* 3.1*   No results for input(s): LIPASE, AMYLASE in the last 168 hours. No results for input(s): AMMONIA in the last 168 hours. CBC:  Recent Labs Lab 11/17/16 0625 11/18/16 0446 11/19/16 0528 11/20/16 0420 11/21/16 0615  WBC 11.5* 13.2* 18.5* 12.9* 11.8*  HGB 10.8* 10.7* 10.8* 11.0* 10.2*  HCT 32.6* 32.6* 33.9* 33.2* 30.8*  MCV 81.1 81.3 82.7 82.4 83.0  PLT 534* 550* 558* 579* 584*   Cardiac Enzymes: No results for input(s): CKTOTAL, CKMB, CKMBINDEX,  TROPONINI in the last 168 hours. BNP: Invalid input(s): POCBNP CBG: No results for input(s): GLUCAP in the last 168 hours. D-Dimer No results for input(s): DDIMER in the last 72 hours. Hgb A1c No results for input(s): HGBA1C in the last 72 hours. Lipid Profile No results for input(s): CHOL, HDL, LDLCALC, TRIG, CHOLHDL, LDLDIRECT in the last 72 hours. Thyroid function studies No results for input(s): TSH, T4TOTAL, T3FREE, THYROIDAB in the last 72 hours.  Invalid input(s): FREET3 Anemia work up No results for input(s): VITAMINB12, FOLATE, FERRITIN, TIBC, IRON, RETICCTPCT in the last 72 hours. Urinalysis No results found for: COLORURINE, APPEARANCEUR, LABSPEC, PHURINE, GLUCOSEU, HGBUR, BILIRUBINUR, KETONESUR, PROTEINUR, UROBILINOGEN, NITRITE, LEUKOCYTESUR Sepsis Labs Invalid input(s): PROCALCITONIN,  WBC,  LACTICIDVEN Microbiology Recent Results (from the past 240 hour(s))  MRSA PCR Screening     Status: None   Collection Time: 11/12/16  9:27 PM  Result Value Ref Range Status   MRSA by PCR NEGATIVE NEGATIVE Final    Comment:        The GeneXpert MRSA Assay (FDA approved for NASAL specimens only), is one component of a comprehensive MRSA colonization surveillance  program. It is not intended to diagnose MRSA infection nor to guide or monitor treatment for MRSA infections.      Time coordinating discharge: 45 minutes  SIGNED:   Coralie Keens, MD  [facility] 11/21/2016, 10:48 AM Pager [phone]  If 7PM-7AM, please contact night-coverage [email or web address] Password [email or web address]

## 2016-11-21 NOTE — Telephone Encounter (Signed)
APPT MADE AND NURSE ON 300 AWARE AND WILL LET PATIENT KNOW  °

## 2016-11-21 NOTE — Addendum Note (Signed)
Addendum  created 11/21/16 0800 by Franco Noneseresa S Samauri Kellenberger, CRNA   Sign clinical note

## 2016-11-21 NOTE — Clinical Social Work Placement (Signed)
   CLINICAL SOCIAL WORK PLACEMENT  NOTE  Date:  11/21/2016  Patient Details  Name: Raymond Fox MRN: [id number] Date of Birth: 05/21/1961  Clinical Social Work is seeking post-discharge placement for this patient at the Skilled  Nursing Facility level of care (*CSW will initial, date and re-position this form in  chart as items are completed):  Yes   Patient/family provided with [place] Clinical Social Work Department's list of facilities offering this level of care within the geographic area requested by the patient (or if unable, by the patient's family).  Yes   Patient/family informed of their freedom to choose among providers that offer the needed level of care, that participate in Medicare, Medicaid or managed care program needed by the patient, have an available bed and are willing to accept the patient.  Yes   Patient/family informed of [facility]'s ownership interest in [facility] and [facility], as well as of the fact that they are under no obligation to receive care at these facilities.  PASRR submitted to EDS on 11/20/16     PASRR number received on 11/20/16     Existing PASRR number confirmed on       FL2 transmitted to all facilities in geographic area requested by pt/family on 11/20/16     FL2 transmitted to all facilities within larger geographic area on       Patient informed that his/her managed care company has contracts with or will negotiate with certain facilities, including the following:        Yes   Patient/family informed of bed offers received.  [facility]     Physician recommends and patient chooses bed at      Patient [facility] on 11/21/16.  Patient to be transferred to [facility], significant other     Patient family notified on 11/21/16 of transfer.  Name of family member notified:  Raymond Fox, significant other     PHYSICIAN       Additional Comment:     _______________________________________________ Annice NeedySettle, Lalena Salas D, LCSW 11/21/2016, 10:39 AM

## 2016-11-21 NOTE — Progress Notes (Signed)
Respiratory Care note: While in assisting the patient Breo inhaler, the patient asked if we had Spiriva. He states he normally takes Spiriva once daily and have not had it since he came on Monday. I told him that I would put this progress note and speak with his RN. I also encouraged him to speak to the MD. RN aware and stated that she had called into the MD and would ask the MD about it.

## 2016-11-21 NOTE — Progress Notes (Signed)
[facility] order.  Report called to receiving Ixchel at 1225.   Allergies as of 11/21/2016      Reactions   Penicillins Rash   Has patient had a PCN reaction causing immediate rash, facial/tongue/throat swelling, SOB or lightheadedness with hypotension: Yes Has patient had a PCN reaction causing severe rash involving mucus membranes or skin necrosis: No Has patient had a PCN reaction that required hospitalization No Tolerated zosyn for 3 days without problems 1/23 Has patient had a PCN reaction occurring within the last 10 years: No If all of the above answers are "NO", then may proceed with Cephalosporin use. 1      Medication List    STOP taking these medications   EXCEDRIN EXTRA STRENGTH 250-250-65 MG tablet Generic drug:  aspirin-acetaminophen-caffeine     TAKE these medications   acetaminophen 500 MG tablet Commonly known as:  TYLENOL Take 500 mg by mouth every 6 (six) hours as needed.   ferrous sulfate 325 (65 FE) MG tablet Take 1 tablet (325 mg total) by mouth 3 (three) times daily with meals.   folic acid 1 MG tablet Commonly known as:  FOLVITE Take 1 tablet (1 mg total) by mouth daily. Start taking on:  11/22/2016   LORazepam 1 MG tablet Commonly known as:  ATIVAN Take 1 tablet (1 mg total) by mouth 3 (three) times daily as needed for anxiety.   multivitamin with minerals Tabs tablet Take 1 tablet by mouth daily. Start taking on:  11/22/2016   nicotine 21 mg/24hr patch Commonly known as:  NICODERM CQ - dosed in mg/24 hours Place 1 patch (21 mg total) onto the skin daily. Start taking on:  11/22/2016   pantoprazole 40 MG tablet Commonly known as:  PROTONIX Take 1 tablet (40 mg total) by mouth 2 (two) times daily before a meal.   thiamine 100 MG tablet Take 1 tablet (100 mg total) by mouth daily. Start taking on:  11/22/2016       Patients skin is clean, dry and intact, no evidence of skin break down. IV site  discontinued and catheter remains intact. Site without signs and symptoms of complications. Dressing and pressure applied.  [facility],  no distress noted upon discharge.  Jerilee HohBonnie M Preslynn Bier

## 2016-11-21 NOTE — Progress Notes (Signed)
"  i'm going to sleep." Pt in bed with bed alarm on and side rails up. Door opened. Nothing needed at this time.

## 2016-11-21 NOTE — Anesthesia Postprocedure Evaluation (Signed)
Anesthesia Post Note  Patient: Raymond Fox  Procedure(s) Performed: Procedure(s) (LRB): ESOPHAGOGASTRODUODENOSCOPY (EGD) WITH PROPOFOL (N/A)  Patient location during evaluation: [facility] Anesthesia Type: MAC Level of consciousness: awake and alert Pain management: satisfactory to patient Vital Signs Assessment: post-procedure vital signs reviewed and stable Respiratory status: spontaneous breathing Cardiovascular status: stable Anesthetic complications: no     Last Vitals:  Vitals:   11/20/16 2135 11/21/16 0626  BP: 104/65 119/75  Pulse: (!) 109 96  Resp: 18 18  Temp: 37.1 C 36.8 C    Last Pain:  Vitals:   11/21/16 0626  TempSrc: Oral  PainSc:                  Drucie Opitz

## 2016-11-21 NOTE — Progress Notes (Signed)
Physical Therapy Treatment Patient Details Name: PARRIS CUDWORTH MRN: [id number] DOB: 04-15-1961 Today's Date: 11/21/2016    History of Present Illness 56 y.o. male with medical history significant of etoh abuse, gastric ulcer, duodenal ulcer, asthma, polysubstance abuse comes in with 2 days of epigastric abdominal pain and what started off as melana, then progressed to maroonish colored stool, then he started getting nauseated and vomiting dark reddish vomit today.  He denies any fevers.  His last drink was yesterday.  Pt is very nauseated and is coughing a lot.  He has a h/o seizures before I presume from etoh withdrawal.  Pt referred for admission for gib.  Pt had egd in 2014 when he had a bleeding ulcer, at that time had no varices.  Dx: Upper GIB.  GI on board Is to undergo EGD on 11/18/2016.     PT Comments    Pt with modified independence with all mobility and required constant cues to get mobility initiated.  Cues to correct forward bent posturing and slow gait.  Able to complete 75 feet with RW and returned to recliner in room with CG present.  No chair pads available, however instructed not to get up unassisted.    Follow Up Recommendations  SNF;Supervision/Assistance - 24 hour     Equipment Recommendations  None recommended by PT    Recommendations for Other Services       Precautions / Restrictions Precautions Precautions: Fall;Other (comment) Precaution Comments: Due to immobiltiy and diminished cognition    Mobility  Bed Mobility Overal bed mobility: Modified Independent Bed Mobility: Supine to Sit     Supine to sit: Modified independent (Device/Increase time)        Transfers Overall transfer level: Needs assistance Equipment used: Rolling walker (2 wheeled) Transfers: Sit to/from Stand Sit to Stand: Supervision            Ambulation/Gait Ambulation/Gait assistance: Min guard Ambulation Distance (Feet): 75 Feet Assistive device: Rolling walker (2  wheeled) Gait Pattern/deviations: Step-to pattern;Trunk flexed;Narrow base of support;Shuffle         Stairs            Wheelchair Mobility    Modified Rankin (Stroke Patients Only)       Balance                                    Cognition Arousal/Alertness: Awake/alert Behavior During Therapy: Flat affect Overall Cognitive Status: Within Functional Limits for tasks assessed                      Exercises      General Comments        Pertinent Vitals/Pain      Home Living                      Prior Function            PT Goals (current goals can now be found in the care plan section) Progress towards PT goals: Progressing toward goals    Frequency    Min 3X/week      PT Plan Current plan remains appropriate    Co-evaluation             End of Session Equipment Utilized During Treatment: Gait belt Activity Tolerance: Patient limited by fatigue Patient left: with call bell/phone within reach;in chair;with family/visitor present  Time: [id number] PT Time Calculation (min) (ACUTE ONLY): 35 min  Lurena Nidamy B Giang Hemme, PTA/CLT [phone]  11/21/2016, 1:29 PM

## 2016-11-21 NOTE — Telephone Encounter (Signed)
Please arrange for hospital follow in 10-12 weeks with Dr. Darrick PennaFields or PA/NP

## 2016-11-21 NOTE — Op Note (Signed)
[facility] Patient Name: Raymond AltesWilliam Fox Procedure Date: 11/20/2016 12:42 PM MRN: [id number] Date of Birth: 1961-05-10 Attending MD: Raymond Fox , MD CSN: [id number] Age: [age] Admit Type: Inpatient Procedure:                Upper GI endoscopy with gastric biopsy Indications:              Melena Providers:                Raymond Pacobert Michael Arelis Neumeier, MD, Criselda PeachesLurae B. Patsy LagerAlbert RN, RN,                            Raymond Ruddleonya Wilson Referring MD:              Medicines:                Propofol per Anesthesia Complications:            No immediate complications. Estimated Blood Loss:     Estimated blood loss was minimal. Procedure:                Pre-Anesthesia Assessment:                           - Prior to the procedure, a History and Physical                            was performed, and patient medications and                            allergies were reviewed. The patient's tolerance of                            previous anesthesia was also reviewed. The risks                            and benefits of the procedure and the sedation                            options and risks were discussed with the patient.                            All questions were answered, and informed consent                            was obtained. Prior Anticoagulants: The patient has                            taken no previous anticoagulant or antiplatelet                            agents. ASA Grade Assessment: II - A patient with                            mild systemic disease. After reviewing the risks  and benefits, the patient was deemed in                            satisfactory condition to undergo the procedure.                           After obtaining informed consent, the endoscope was                            passed under direct vision. Throughout the                            procedure, the patient's blood pressure, pulse, and                            oxygen  saturations were monitored continuously. The                            EG-[id number] ([id number]) scope was introduced through the                            mouth, and advanced to the second part of duodenum.                            The upper GI endoscopy was accomplished without                            difficulty. The patient tolerated the procedure                            well. Scope In: 2:25:22 PM Scope Out: 2:34:33 PM Total Procedure Duration: 0 hours 9 minutes 11 seconds  Findings:      The examined esophagus was normal.      A small hiatal hernia was present. Multiple antral gastric erosions       present. No pleural hypertension. No varices seen. No ulcer or       infiltrating process. Patent pylorus. multiple bulbar erosions seen with       a "figure-of-eight" 1 cm ulcer partially hidden in a fold at the       junction of the bulb and second portion. No bleeding stigmata.      Gastric mucosa was biopsied. Estimated blood loss was minimal. Impression:               - Normal esophagus.                           - Small hiatal hernia.                           - Gastric erosions?status post biopsy.                           - duodenal ulcer with associated erosions.. Moderate Sedation:      Moderate (conscious) sedation was administered by the endoscopy nurse       and supervised by the endoscopist.  The following parameters were       monitored: oxygen saturation, heart rate, blood pressure, respiratory       rate, EKG, adequacy of pulmonary ventilation, and response to care.       Total physician intraservice time was 15 minutes. Recommendation:           - Return patient to hospital ward for ongoing care.                           - Cardiac diet.                           - Use Protonix (pantoprazole) 40 mg PO BID.                           - Await pathology results. Avoid all nonsteroidals.                            - Return to GI office in 12 weeks.                            - Continue present medications. Procedure Code(s):        --- Professional ---                           [phone], Esophagogastroduodenoscopy, flexible,                            transoral; with biopsy, single or multiple Diagnosis Code(s):        --- Professional ---                           K44.9, Diaphragmatic hernia without obstruction or                            gangrene                           K31.89, Other diseases of stomach and duodenum                           K92.1, Melena (includes Hematochezia) CPT copyright 2016 American [employer] Association. All rights reserved. The codes documented in this report are preliminary and upon coder review may  be revised to meet current compliance requirements. Raymond Fox. Raymond Paglia, MD Raymond Pac, MD 11/21/2016 7:59:31 AM This report has been signed electronically. Number of Addenda: 0

## 2016-11-21 NOTE — Progress Notes (Signed)
Subjective:  Tolerating diet. Alert and conversant. No questions.   Objective: Vital signs in last 24 hours: Temp:  [98.2 F (36.8 C)-98.8 F (37.1 C)] 98.2 F (36.8 C) (02/01 0626) Pulse Rate:  [78-109] 96 (02/01 0626) Resp:  [16-46] 18 (02/01 0626) BP: (99-132)/(65-82) 119/75 (02/01 0626) SpO2:  [92 %-100 %] 92 % (02/01 0626) Weight:  [111 lb (50.3 kg)] 111 lb (50.3 kg) (01/31 1228) Last BM Date: 11/14/16 General:   Alert,  Well-developed, well-nourished, pleasant and cooperative in NAD Head:  Normocephalic and atraumatic. Eyes:  Sclera clear, no icterus.  Abdomen:  Soft, nontender and nondistended. Normal bowel sounds, without guarding, and without rebound.   Extremities:  Without clubbing, deformity or edema. Neurologic:  Alert and  oriented x2 (person/place);  grossly normal neurologically. Skin:  Intact without significant lesions or rashes. Psych:  Alert and cooperative. Normal mood and affect.  Intake/Output from previous day: 01/31 0701 - 02/01 0700 In: 1040 [P.O.:240; I.V.:600; IV Piggyback:200] Out: 500 [Urine:500] Intake/Output this shift: No intake/output data recorded.  Lab Results: CBC  Recent Labs  11/19/16 0528 11/20/16 0420 11/21/16 0615  WBC 18.5* 12.9* 11.8*  HGB 10.8* 11.0* 10.2*  HCT 33.9* 33.2* 30.8*  MCV 82.7 82.4 83.0  PLT 558* 579* 584*   BMET  Recent Labs  11/19/16 0528 11/20/16 0420 11/21/16 0615  NA 135 132* 134*  K 4.2 3.9 4.0  CL 101 98* 101  CO2 25 24 26   GLUCOSE 82 77 84  BUN 13 13 16   CREATININE 0.89 0.97 0.87  CALCIUM 9.1 8.7* 8.5*   LFTs  Recent Labs  11/19/16 0528 11/20/16 0420 11/21/16 0615  BILITOT 0.4 0.4 0.6  ALKPHOS 73 69 72  AST 20 23 21   ALT 9* 10* 9*  PROT 7.0 6.9 6.5  ALBUMIN 3.3* 3.3* 3.1*   No results for input(s): LIPASE in the last 72 hours. PT/INR No results for input(s): LABPROT, INR in the last 72 hours.    Imaging Studies: Dg Chest 2 View  Result Date: 11/12/2016 CLINICAL DATA:   Bright red blood in stool.  Abdominal pain. EXAM: CHEST  2 VIEW COMPARISON:  02/04/2016 FINDINGS: Multiple old left rib fractures. New densities at the left costophrenic angle. Evidence for lung hyperinflation. Heart and mediastinum are within normal limits. Again noted is a compression fracture in the mid thoracic spine. No large pleural effusions. IMPRESSION: Chronic hyperinflation with new left basilar densities. Findings could represent atelectasis. Electronically Signed   By: Richarda OverlieAdam  Henn M.D.   On: 11/12/2016 18:56   Ct Abdomen Pelvis W Contrast  Result Date: 11/12/2016 CLINICAL DATA:  56 year old male with history of hematochezia at noon today. Abdominal pain for the past several days, worsening today. History of bleeding ulcer. EXAM: CT ABDOMEN AND PELVIS WITH CONTRAST TECHNIQUE: Multidetector CT imaging of the abdomen and pelvis was performed using the standard protocol following bolus administration of intravenous contrast. CONTRAST:  100mL ISOVUE-300 IOPAMIDOL (ISOVUE-300) INJECTION 61% COMPARISON:  CT the abdomen and pelvis 10/29/2012. FINDINGS: Lower chest: Scattered areas of mild cylindrical bronchiectasis are noted in the visualize lung bases, similar to prior studies. Hepatobiliary: No cystic or solid hepatic lesions. No intra or extrahepatic biliary ductal dilatation. Gallbladder is unremarkable in appearance. Pancreas: No pancreatic mass. No pancreatic ductal dilatation. No pancreatic or peripancreatic fluid or inflammatory changes. Spleen: Status post splenectomy, with a large splenule immediately medial to the left kidney, similar to the prior study. Adrenals/Urinary Tract: Bilateral kidneys and bilateral adrenal glands are normal in  appearance. No hydroureteronephrosis. Urinary bladder is unremarkable in appearance (partially obscured by beam hardening artifact from the patient's right hip arthroplasty). Stomach/Bowel: The appearance of the stomach is normal. There is no pathologic dilatation  of small bowel or colon. The appendix is not confidently identified and may be surgically absent. Regardless, there are no inflammatory changes noted adjacent to the cecum to suggest the presence of an acute appendicitis at this time. Vascular/Lymphatic: Aortic atherosclerosis, without evidence of aneurysm or dissection in the abdominal or pelvic vasculature. No lymphadenopathy noted in the abdomen or pelvis. Reproductive: Prostate gland seminal vesicles are unremarkable in appearance. Other: No significant volume of ascites.  No pneumoperitoneum. Musculoskeletal: Status post PLIF at L5-S1 with interbody graft at L5-S1 interspace. There are no aggressive appearing lytic or blastic lesions noted in the visualized portions of the skeleton. Status post right hip arthroplasty. IMPRESSION: 1. No acute findings in the abdomen or pelvis to account for the patient's symptoms. 2. Scattered areas of cylindrical bronchiectasis are again noted in the lung bases bilaterally, chronic and similar to prior examinations. 3. Aortic atherosclerosis. 4. Additional incidental findings, as above. Electronically Signed   By: Trudie Reed M.D.   On: 11/12/2016 18:41   Dg Chest Port 1 View  Result Date: 11/19/2016 CLINICAL DATA:  56 year old male with shortness of breath. Initial encounter. EXAM: PORTABLE CHEST 1 VIEW COMPARISON:  11/15/2016 and earlier. FINDINGS: Portable AP upright view at 0705 hours. Lung volumes remain stable. Mediastinal contours remain normal. Visualized tracheal air column is within normal limits. Multilevel chronic left lateral rib fractures. Allowing for portable technique the lungs are clear. No pneumothorax or pleural effusion. IMPRESSION: No acute cardiopulmonary abnormality. Electronically Signed   By: Odessa Fleming M.D.   On: 11/19/2016 07:47   Dg Chest Port 1 View  Result Date: 11/15/2016 CLINICAL DATA:  Shortness of Breath, asthma, hypertension. EXAM: PORTABLE CHEST 1 VIEW COMPARISON:  11/12/2016  FINDINGS: There is hyperinflation of the lungs compatible with COPD. Left basilar atelectasis or infiltrate again noted, unchanged. Right lung is clear. Heart is normal size. No visible effusion or acute bony abnormality. IMPRESSION: Hyperinflation/COPD.  Continued left base atelectasis or infiltrate. Electronically Signed   By: Charlett Nose M.D.   On: 11/15/2016 09:19  [2 weeks]   Assessment:  56 year old male admitted with upper GI bleed in the setting of NSAID use, receiving total of 2 units this admission (11/13/16). No overt GI bleeding, Hgb stable. History of alcohol abuse with DTs this admission, slowly improved.  EGD yesterday with multiple antral gastric erosions, duodenal ulcer with associated erosions, no varices.   Plan: 1. PPI twice a day 2. Await pathology. 3. Return to the office in 12 weeks. 4. Will sign off. Please call with questions or concerns.  Leanna Battles. Dixon Boos [facility] [phone] 2/1/20189:07 AM     LOS: 9 days

## 2016-11-22 ENCOUNTER — Encounter (HOSPITAL_COMMUNITY): Payer: Self-pay | Admitting: Internal Medicine

## 2016-11-25 ENCOUNTER — Encounter: Payer: Self-pay | Admitting: Internal Medicine

## 2016-11-26 ENCOUNTER — Telehealth: Payer: Self-pay

## 2016-11-26 ENCOUNTER — Encounter: Payer: Self-pay | Admitting: Internal Medicine

## 2016-11-26 NOTE — Telephone Encounter (Signed)
OV made and letter mailed °

## 2016-11-26 NOTE — Telephone Encounter (Signed)
Per  RMR- Send letter to patient.  Send copy of letter with path to referring provider and PCP. Patient should have an office visit in about 3 months to set up a repeat EGD.

## 2016-11-26 NOTE — Telephone Encounter (Signed)
Letter mailed to the pt. 

## 2016-12-02 DIAGNOSIS — F10288 Alcohol dependence with other alcohol-induced disorder: Secondary | ICD-10-CM | POA: Diagnosis not present

## 2016-12-02 DIAGNOSIS — R918 Other nonspecific abnormal finding of lung field: Secondary | ICD-10-CM | POA: Diagnosis not present

## 2016-12-02 DIAGNOSIS — K922 Gastrointestinal hemorrhage, unspecified: Secondary | ICD-10-CM | POA: Diagnosis not present

## 2016-12-02 DIAGNOSIS — E43 Unspecified severe protein-calorie malnutrition: Secondary | ICD-10-CM | POA: Diagnosis not present

## 2016-12-02 DIAGNOSIS — M6281 Muscle weakness (generalized): Secondary | ICD-10-CM | POA: Diagnosis not present

## 2016-12-02 DIAGNOSIS — K279 Peptic ulcer, site unspecified, unspecified as acute or chronic, without hemorrhage or perforation: Secondary | ICD-10-CM | POA: Diagnosis not present

## 2016-12-02 DIAGNOSIS — S6991XA Unspecified injury of right wrist, hand and finger(s), initial encounter: Secondary | ICD-10-CM | POA: Diagnosis not present

## 2016-12-02 DIAGNOSIS — F1020 Alcohol dependence, uncomplicated: Secondary | ICD-10-CM | POA: Diagnosis not present

## 2016-12-02 DIAGNOSIS — Z8711 Personal history of peptic ulcer disease: Secondary | ICD-10-CM | POA: Diagnosis not present

## 2016-12-02 DIAGNOSIS — M79641 Pain in right hand: Secondary | ICD-10-CM | POA: Diagnosis not present

## 2016-12-02 DIAGNOSIS — F17210 Nicotine dependence, cigarettes, uncomplicated: Secondary | ICD-10-CM | POA: Diagnosis not present

## 2016-12-02 DIAGNOSIS — J449 Chronic obstructive pulmonary disease, unspecified: Secondary | ICD-10-CM | POA: Diagnosis not present

## 2016-12-02 DIAGNOSIS — E440 Moderate protein-calorie malnutrition: Secondary | ICD-10-CM | POA: Diagnosis not present

## 2016-12-02 DIAGNOSIS — F10231 Alcohol dependence with withdrawal delirium: Secondary | ICD-10-CM | POA: Diagnosis not present

## 2016-12-02 DIAGNOSIS — K9289 Other specified diseases of the digestive system: Secondary | ICD-10-CM | POA: Diagnosis not present

## 2016-12-02 DIAGNOSIS — Z6820 Body mass index (BMI) 20.0-20.9, adult: Secondary | ICD-10-CM | POA: Diagnosis not present

## 2016-12-02 DIAGNOSIS — R0902 Hypoxemia: Secondary | ICD-10-CM | POA: Diagnosis not present

## 2016-12-02 DIAGNOSIS — D509 Iron deficiency anemia, unspecified: Secondary | ICD-10-CM | POA: Diagnosis not present

## 2016-12-02 DIAGNOSIS — J441 Chronic obstructive pulmonary disease with (acute) exacerbation: Secondary | ICD-10-CM | POA: Diagnosis not present

## 2016-12-02 DIAGNOSIS — I10 Essential (primary) hypertension: Secondary | ICD-10-CM | POA: Diagnosis not present

## 2016-12-10 DIAGNOSIS — I10 Essential (primary) hypertension: Secondary | ICD-10-CM | POA: Diagnosis not present

## 2016-12-10 DIAGNOSIS — F17210 Nicotine dependence, cigarettes, uncomplicated: Secondary | ICD-10-CM | POA: Diagnosis not present

## 2016-12-10 DIAGNOSIS — D509 Iron deficiency anemia, unspecified: Secondary | ICD-10-CM | POA: Diagnosis not present

## 2016-12-10 DIAGNOSIS — F10288 Alcohol dependence with other alcohol-induced disorder: Secondary | ICD-10-CM | POA: Diagnosis not present

## 2016-12-10 DIAGNOSIS — M6281 Muscle weakness (generalized): Secondary | ICD-10-CM | POA: Diagnosis not present

## 2016-12-10 DIAGNOSIS — K922 Gastrointestinal hemorrhage, unspecified: Secondary | ICD-10-CM | POA: Diagnosis not present

## 2016-12-12 DIAGNOSIS — I10 Essential (primary) hypertension: Secondary | ICD-10-CM | POA: Diagnosis not present

## 2016-12-12 DIAGNOSIS — F10288 Alcohol dependence with other alcohol-induced disorder: Secondary | ICD-10-CM | POA: Diagnosis not present

## 2016-12-12 DIAGNOSIS — M6281 Muscle weakness (generalized): Secondary | ICD-10-CM | POA: Diagnosis not present

## 2016-12-12 DIAGNOSIS — F17210 Nicotine dependence, cigarettes, uncomplicated: Secondary | ICD-10-CM | POA: Diagnosis not present

## 2016-12-12 DIAGNOSIS — D509 Iron deficiency anemia, unspecified: Secondary | ICD-10-CM | POA: Diagnosis not present

## 2016-12-12 DIAGNOSIS — K922 Gastrointestinal hemorrhage, unspecified: Secondary | ICD-10-CM | POA: Diagnosis not present

## 2016-12-13 DIAGNOSIS — S39011A Strain of muscle, fascia and tendon of abdomen, initial encounter: Secondary | ICD-10-CM | POA: Diagnosis not present

## 2016-12-13 DIAGNOSIS — F10288 Alcohol dependence with other alcohol-induced disorder: Secondary | ICD-10-CM | POA: Diagnosis not present

## 2016-12-13 DIAGNOSIS — Z79899 Other long term (current) drug therapy: Secondary | ICD-10-CM | POA: Diagnosis not present

## 2016-12-13 DIAGNOSIS — R1011 Right upper quadrant pain: Secondary | ICD-10-CM | POA: Diagnosis not present

## 2016-12-13 DIAGNOSIS — T148XXA Other injury of unspecified body region, initial encounter: Secondary | ICD-10-CM | POA: Diagnosis not present

## 2016-12-13 DIAGNOSIS — F17210 Nicotine dependence, cigarettes, uncomplicated: Secondary | ICD-10-CM | POA: Diagnosis not present

## 2016-12-13 DIAGNOSIS — D509 Iron deficiency anemia, unspecified: Secondary | ICD-10-CM | POA: Diagnosis not present

## 2016-12-13 DIAGNOSIS — M6281 Muscle weakness (generalized): Secondary | ICD-10-CM | POA: Diagnosis not present

## 2016-12-13 DIAGNOSIS — K922 Gastrointestinal hemorrhage, unspecified: Secondary | ICD-10-CM | POA: Diagnosis not present

## 2016-12-13 DIAGNOSIS — F17200 Nicotine dependence, unspecified, uncomplicated: Secondary | ICD-10-CM | POA: Diagnosis not present

## 2016-12-13 DIAGNOSIS — I10 Essential (primary) hypertension: Secondary | ICD-10-CM | POA: Diagnosis not present

## 2016-12-13 DIAGNOSIS — R109 Unspecified abdominal pain: Secondary | ICD-10-CM | POA: Diagnosis not present

## 2016-12-13 DIAGNOSIS — W010XXA Fall on same level from slipping, tripping and stumbling without subsequent striking against object, initial encounter: Secondary | ICD-10-CM | POA: Diagnosis not present

## 2016-12-16 DIAGNOSIS — G40109 Localization-related (focal) (partial) symptomatic epilepsy and epileptic syndromes with simple partial seizures, not intractable, without status epilepticus: Secondary | ICD-10-CM | POA: Diagnosis not present

## 2016-12-16 DIAGNOSIS — E440 Moderate protein-calorie malnutrition: Secondary | ICD-10-CM | POA: Diagnosis not present

## 2016-12-16 DIAGNOSIS — J440 Chronic obstructive pulmonary disease with acute lower respiratory infection: Secondary | ICD-10-CM | POA: Diagnosis not present

## 2016-12-16 DIAGNOSIS — K210 Gastro-esophageal reflux disease with esophagitis: Secondary | ICD-10-CM | POA: Diagnosis not present

## 2016-12-16 DIAGNOSIS — Z6820 Body mass index (BMI) 20.0-20.9, adult: Secondary | ICD-10-CM | POA: Diagnosis not present

## 2016-12-16 DIAGNOSIS — I10 Essential (primary) hypertension: Secondary | ICD-10-CM | POA: Diagnosis not present

## 2016-12-16 DIAGNOSIS — F411 Generalized anxiety disorder: Secondary | ICD-10-CM | POA: Diagnosis not present

## 2016-12-17 DIAGNOSIS — F10288 Alcohol dependence with other alcohol-induced disorder: Secondary | ICD-10-CM | POA: Diagnosis not present

## 2016-12-17 DIAGNOSIS — D509 Iron deficiency anemia, unspecified: Secondary | ICD-10-CM | POA: Diagnosis not present

## 2016-12-17 DIAGNOSIS — F17210 Nicotine dependence, cigarettes, uncomplicated: Secondary | ICD-10-CM | POA: Diagnosis not present

## 2016-12-17 DIAGNOSIS — K922 Gastrointestinal hemorrhage, unspecified: Secondary | ICD-10-CM | POA: Diagnosis not present

## 2016-12-17 DIAGNOSIS — I10 Essential (primary) hypertension: Secondary | ICD-10-CM | POA: Diagnosis not present

## 2016-12-17 DIAGNOSIS — M6281 Muscle weakness (generalized): Secondary | ICD-10-CM | POA: Diagnosis not present

## 2016-12-19 DIAGNOSIS — F10288 Alcohol dependence with other alcohol-induced disorder: Secondary | ICD-10-CM | POA: Diagnosis not present

## 2016-12-19 DIAGNOSIS — F17210 Nicotine dependence, cigarettes, uncomplicated: Secondary | ICD-10-CM | POA: Diagnosis not present

## 2016-12-19 DIAGNOSIS — D509 Iron deficiency anemia, unspecified: Secondary | ICD-10-CM | POA: Diagnosis not present

## 2016-12-19 DIAGNOSIS — K922 Gastrointestinal hemorrhage, unspecified: Secondary | ICD-10-CM | POA: Diagnosis not present

## 2016-12-19 DIAGNOSIS — M6281 Muscle weakness (generalized): Secondary | ICD-10-CM | POA: Diagnosis not present

## 2016-12-19 DIAGNOSIS — I10 Essential (primary) hypertension: Secondary | ICD-10-CM | POA: Diagnosis not present

## 2016-12-20 DIAGNOSIS — F10288 Alcohol dependence with other alcohol-induced disorder: Secondary | ICD-10-CM | POA: Diagnosis not present

## 2016-12-20 DIAGNOSIS — D509 Iron deficiency anemia, unspecified: Secondary | ICD-10-CM | POA: Diagnosis not present

## 2016-12-20 DIAGNOSIS — K922 Gastrointestinal hemorrhage, unspecified: Secondary | ICD-10-CM | POA: Diagnosis not present

## 2016-12-20 DIAGNOSIS — I10 Essential (primary) hypertension: Secondary | ICD-10-CM | POA: Diagnosis not present

## 2016-12-20 DIAGNOSIS — F17210 Nicotine dependence, cigarettes, uncomplicated: Secondary | ICD-10-CM | POA: Diagnosis not present

## 2016-12-20 DIAGNOSIS — M6281 Muscle weakness (generalized): Secondary | ICD-10-CM | POA: Diagnosis not present

## 2016-12-21 DIAGNOSIS — R197 Diarrhea, unspecified: Secondary | ICD-10-CM | POA: Diagnosis not present

## 2016-12-21 DIAGNOSIS — F17200 Nicotine dependence, unspecified, uncomplicated: Secondary | ICD-10-CM | POA: Diagnosis not present

## 2016-12-21 DIAGNOSIS — Z8249 Family history of ischemic heart disease and other diseases of the circulatory system: Secondary | ICD-10-CM | POA: Diagnosis not present

## 2016-12-21 DIAGNOSIS — Z79899 Other long term (current) drug therapy: Secondary | ICD-10-CM | POA: Diagnosis not present

## 2016-12-21 DIAGNOSIS — I10 Essential (primary) hypertension: Secondary | ICD-10-CM | POA: Diagnosis not present

## 2016-12-21 DIAGNOSIS — F17209 Nicotine dependence, unspecified, with unspecified nicotine-induced disorders: Secondary | ICD-10-CM | POA: Diagnosis not present

## 2016-12-21 DIAGNOSIS — J449 Chronic obstructive pulmonary disease, unspecified: Secondary | ICD-10-CM | POA: Diagnosis not present

## 2016-12-24 DIAGNOSIS — F17210 Nicotine dependence, cigarettes, uncomplicated: Secondary | ICD-10-CM | POA: Diagnosis not present

## 2016-12-24 DIAGNOSIS — J440 Chronic obstructive pulmonary disease with acute lower respiratory infection: Secondary | ICD-10-CM | POA: Diagnosis not present

## 2016-12-24 DIAGNOSIS — Z6820 Body mass index (BMI) 20.0-20.9, adult: Secondary | ICD-10-CM | POA: Diagnosis not present

## 2016-12-24 DIAGNOSIS — K922 Gastrointestinal hemorrhage, unspecified: Secondary | ICD-10-CM | POA: Diagnosis not present

## 2016-12-24 DIAGNOSIS — F10288 Alcohol dependence with other alcohol-induced disorder: Secondary | ICD-10-CM | POA: Diagnosis not present

## 2016-12-24 DIAGNOSIS — K210 Gastro-esophageal reflux disease with esophagitis: Secondary | ICD-10-CM | POA: Diagnosis not present

## 2016-12-24 DIAGNOSIS — M6281 Muscle weakness (generalized): Secondary | ICD-10-CM | POA: Diagnosis not present

## 2016-12-24 DIAGNOSIS — D509 Iron deficiency anemia, unspecified: Secondary | ICD-10-CM | POA: Diagnosis not present

## 2016-12-24 DIAGNOSIS — I10 Essential (primary) hypertension: Secondary | ICD-10-CM | POA: Diagnosis not present

## 2016-12-24 DIAGNOSIS — E440 Moderate protein-calorie malnutrition: Secondary | ICD-10-CM | POA: Diagnosis not present

## 2016-12-25 DIAGNOSIS — M6281 Muscle weakness (generalized): Secondary | ICD-10-CM | POA: Diagnosis not present

## 2016-12-25 DIAGNOSIS — F17210 Nicotine dependence, cigarettes, uncomplicated: Secondary | ICD-10-CM | POA: Diagnosis not present

## 2016-12-25 DIAGNOSIS — D509 Iron deficiency anemia, unspecified: Secondary | ICD-10-CM | POA: Diagnosis not present

## 2016-12-25 DIAGNOSIS — I10 Essential (primary) hypertension: Secondary | ICD-10-CM | POA: Diagnosis not present

## 2016-12-25 DIAGNOSIS — F10288 Alcohol dependence with other alcohol-induced disorder: Secondary | ICD-10-CM | POA: Diagnosis not present

## 2016-12-25 DIAGNOSIS — K922 Gastrointestinal hemorrhage, unspecified: Secondary | ICD-10-CM | POA: Diagnosis not present

## 2016-12-26 DIAGNOSIS — F17210 Nicotine dependence, cigarettes, uncomplicated: Secondary | ICD-10-CM | POA: Diagnosis not present

## 2016-12-26 DIAGNOSIS — I10 Essential (primary) hypertension: Secondary | ICD-10-CM | POA: Diagnosis not present

## 2016-12-26 DIAGNOSIS — F10288 Alcohol dependence with other alcohol-induced disorder: Secondary | ICD-10-CM | POA: Diagnosis not present

## 2016-12-26 DIAGNOSIS — D509 Iron deficiency anemia, unspecified: Secondary | ICD-10-CM | POA: Diagnosis not present

## 2016-12-26 DIAGNOSIS — K922 Gastrointestinal hemorrhage, unspecified: Secondary | ICD-10-CM | POA: Diagnosis not present

## 2016-12-26 DIAGNOSIS — M6281 Muscle weakness (generalized): Secondary | ICD-10-CM | POA: Diagnosis not present

## 2016-12-27 DIAGNOSIS — M6281 Muscle weakness (generalized): Secondary | ICD-10-CM | POA: Diagnosis not present

## 2016-12-27 DIAGNOSIS — F10288 Alcohol dependence with other alcohol-induced disorder: Secondary | ICD-10-CM | POA: Diagnosis not present

## 2016-12-27 DIAGNOSIS — K922 Gastrointestinal hemorrhage, unspecified: Secondary | ICD-10-CM | POA: Diagnosis not present

## 2016-12-27 DIAGNOSIS — F17210 Nicotine dependence, cigarettes, uncomplicated: Secondary | ICD-10-CM | POA: Diagnosis not present

## 2016-12-27 DIAGNOSIS — D509 Iron deficiency anemia, unspecified: Secondary | ICD-10-CM | POA: Diagnosis not present

## 2016-12-27 DIAGNOSIS — I10 Essential (primary) hypertension: Secondary | ICD-10-CM | POA: Diagnosis not present

## 2016-12-31 DIAGNOSIS — K922 Gastrointestinal hemorrhage, unspecified: Secondary | ICD-10-CM | POA: Diagnosis not present

## 2016-12-31 DIAGNOSIS — D509 Iron deficiency anemia, unspecified: Secondary | ICD-10-CM | POA: Diagnosis not present

## 2016-12-31 DIAGNOSIS — I10 Essential (primary) hypertension: Secondary | ICD-10-CM | POA: Diagnosis not present

## 2016-12-31 DIAGNOSIS — F10288 Alcohol dependence with other alcohol-induced disorder: Secondary | ICD-10-CM | POA: Diagnosis not present

## 2016-12-31 DIAGNOSIS — M6281 Muscle weakness (generalized): Secondary | ICD-10-CM | POA: Diagnosis not present

## 2016-12-31 DIAGNOSIS — F17210 Nicotine dependence, cigarettes, uncomplicated: Secondary | ICD-10-CM | POA: Diagnosis not present

## 2017-01-01 DIAGNOSIS — K922 Gastrointestinal hemorrhage, unspecified: Secondary | ICD-10-CM | POA: Diagnosis not present

## 2017-01-01 DIAGNOSIS — J449 Chronic obstructive pulmonary disease, unspecified: Secondary | ICD-10-CM | POA: Diagnosis not present

## 2017-01-01 DIAGNOSIS — J441 Chronic obstructive pulmonary disease with (acute) exacerbation: Secondary | ICD-10-CM | POA: Diagnosis not present

## 2017-01-01 DIAGNOSIS — M6281 Muscle weakness (generalized): Secondary | ICD-10-CM | POA: Diagnosis not present

## 2017-01-02 DIAGNOSIS — F17210 Nicotine dependence, cigarettes, uncomplicated: Secondary | ICD-10-CM | POA: Diagnosis not present

## 2017-01-02 DIAGNOSIS — D509 Iron deficiency anemia, unspecified: Secondary | ICD-10-CM | POA: Diagnosis not present

## 2017-01-02 DIAGNOSIS — F10288 Alcohol dependence with other alcohol-induced disorder: Secondary | ICD-10-CM | POA: Diagnosis not present

## 2017-01-02 DIAGNOSIS — K922 Gastrointestinal hemorrhage, unspecified: Secondary | ICD-10-CM | POA: Diagnosis not present

## 2017-01-02 DIAGNOSIS — I10 Essential (primary) hypertension: Secondary | ICD-10-CM | POA: Diagnosis not present

## 2017-01-02 DIAGNOSIS — M6281 Muscle weakness (generalized): Secondary | ICD-10-CM | POA: Diagnosis not present

## 2017-01-07 DIAGNOSIS — D509 Iron deficiency anemia, unspecified: Secondary | ICD-10-CM | POA: Diagnosis not present

## 2017-01-07 DIAGNOSIS — F17210 Nicotine dependence, cigarettes, uncomplicated: Secondary | ICD-10-CM | POA: Diagnosis not present

## 2017-01-07 DIAGNOSIS — I10 Essential (primary) hypertension: Secondary | ICD-10-CM | POA: Diagnosis not present

## 2017-01-07 DIAGNOSIS — K922 Gastrointestinal hemorrhage, unspecified: Secondary | ICD-10-CM | POA: Diagnosis not present

## 2017-01-07 DIAGNOSIS — M6281 Muscle weakness (generalized): Secondary | ICD-10-CM | POA: Diagnosis not present

## 2017-01-07 DIAGNOSIS — F10288 Alcohol dependence with other alcohol-induced disorder: Secondary | ICD-10-CM | POA: Diagnosis not present

## 2017-01-16 DIAGNOSIS — F10288 Alcohol dependence with other alcohol-induced disorder: Secondary | ICD-10-CM | POA: Diagnosis not present

## 2017-01-16 DIAGNOSIS — D509 Iron deficiency anemia, unspecified: Secondary | ICD-10-CM | POA: Diagnosis not present

## 2017-01-16 DIAGNOSIS — M6281 Muscle weakness (generalized): Secondary | ICD-10-CM | POA: Diagnosis not present

## 2017-01-16 DIAGNOSIS — I10 Essential (primary) hypertension: Secondary | ICD-10-CM | POA: Diagnosis not present

## 2017-01-16 DIAGNOSIS — F17210 Nicotine dependence, cigarettes, uncomplicated: Secondary | ICD-10-CM | POA: Diagnosis not present

## 2017-01-16 DIAGNOSIS — K922 Gastrointestinal hemorrhage, unspecified: Secondary | ICD-10-CM | POA: Diagnosis not present

## 2017-02-01 DIAGNOSIS — J449 Chronic obstructive pulmonary disease, unspecified: Secondary | ICD-10-CM | POA: Diagnosis not present

## 2017-02-01 DIAGNOSIS — K922 Gastrointestinal hemorrhage, unspecified: Secondary | ICD-10-CM | POA: Diagnosis not present

## 2017-02-01 DIAGNOSIS — J441 Chronic obstructive pulmonary disease with (acute) exacerbation: Secondary | ICD-10-CM | POA: Diagnosis not present

## 2017-02-01 DIAGNOSIS — M6281 Muscle weakness (generalized): Secondary | ICD-10-CM | POA: Diagnosis not present

## 2017-02-10 ENCOUNTER — Ambulatory Visit: Payer: Medicare HMO | Admitting: Gastroenterology

## 2017-02-13 DIAGNOSIS — Z79899 Other long term (current) drug therapy: Secondary | ICD-10-CM | POA: Diagnosis not present

## 2017-02-13 DIAGNOSIS — E440 Moderate protein-calorie malnutrition: Secondary | ICD-10-CM | POA: Diagnosis not present

## 2017-02-13 DIAGNOSIS — K210 Gastro-esophageal reflux disease with esophagitis: Secondary | ICD-10-CM | POA: Diagnosis not present

## 2017-02-13 DIAGNOSIS — Z6820 Body mass index (BMI) 20.0-20.9, adult: Secondary | ICD-10-CM | POA: Diagnosis not present

## 2017-02-13 DIAGNOSIS — J440 Chronic obstructive pulmonary disease with acute lower respiratory infection: Secondary | ICD-10-CM | POA: Diagnosis not present

## 2017-02-13 DIAGNOSIS — G40401 Other generalized epilepsy and epileptic syndromes, not intractable, with status epilepticus: Secondary | ICD-10-CM | POA: Diagnosis not present

## 2017-02-13 DIAGNOSIS — Z125 Encounter for screening for malignant neoplasm of prostate: Secondary | ICD-10-CM | POA: Diagnosis not present

## 2017-02-21 ENCOUNTER — Ambulatory Visit: Payer: Medicare HMO | Admitting: Gastroenterology

## 2017-02-26 ENCOUNTER — Telehealth: Payer: Self-pay | Admitting: Gastroenterology

## 2017-02-26 NOTE — Telephone Encounter (Signed)
Patient cancelled upcoming appointment to schedule his repeat egd.  Said nothing was wrong with him and he was not coming in .

## 2017-02-27 NOTE — Telephone Encounter (Signed)
Noted  

## 2017-02-27 NOTE — Telephone Encounter (Signed)
This is an [facility] pt, R[facility] did his procedures when [facility].

## 2017-02-27 NOTE — Telephone Encounter (Signed)
Forwarding to Julie, this is RMR pt.

## 2017-02-27 NOTE — Telephone Encounter (Signed)
Left message for a return call

## 2017-02-27 NOTE — Telephone Encounter (Signed)
Please call patient and let him know the reason for the office visit because he had a large duodenal ulcer needs to have a repeat upper endoscopy to make sure that has healed. These are Dr. Jena Gaussourk recommendations. I just want to make sure he is aware of why we wanted him to come in. If he still chooses not to follow-up that is his choice.

## 2017-03-03 DIAGNOSIS — K922 Gastrointestinal hemorrhage, unspecified: Secondary | ICD-10-CM | POA: Diagnosis not present

## 2017-03-03 DIAGNOSIS — J449 Chronic obstructive pulmonary disease, unspecified: Secondary | ICD-10-CM | POA: Diagnosis not present

## 2017-03-03 DIAGNOSIS — J441 Chronic obstructive pulmonary disease with (acute) exacerbation: Secondary | ICD-10-CM | POA: Diagnosis not present

## 2017-03-03 DIAGNOSIS — M6281 Muscle weakness (generalized): Secondary | ICD-10-CM | POA: Diagnosis not present

## 2017-03-03 NOTE — Telephone Encounter (Signed)
Pt is aware and he is aware of OV appt on 04/09/2017 at 11:00 Am with Tana CoastLeslie Lewis, PA.

## 2017-03-12 ENCOUNTER — Ambulatory Visit: Payer: Medicare HMO | Admitting: Gastroenterology

## 2017-04-03 DIAGNOSIS — M6281 Muscle weakness (generalized): Secondary | ICD-10-CM | POA: Diagnosis not present

## 2017-04-03 DIAGNOSIS — J441 Chronic obstructive pulmonary disease with (acute) exacerbation: Secondary | ICD-10-CM | POA: Diagnosis not present

## 2017-04-03 DIAGNOSIS — J449 Chronic obstructive pulmonary disease, unspecified: Secondary | ICD-10-CM | POA: Diagnosis not present

## 2017-04-03 DIAGNOSIS — K922 Gastrointestinal hemorrhage, unspecified: Secondary | ICD-10-CM | POA: Diagnosis not present

## 2017-04-09 ENCOUNTER — Ambulatory Visit: Payer: Medicare HMO | Admitting: Gastroenterology

## 2017-04-09 ENCOUNTER — Encounter: Payer: Self-pay | Admitting: Gastroenterology

## 2017-04-09 ENCOUNTER — Telehealth: Payer: Self-pay | Admitting: Gastroenterology

## 2017-04-09 NOTE — Telephone Encounter (Signed)
PATIENT WAS A NO SHOW AND LETTER SENT  °

## 2017-04-14 DIAGNOSIS — Z681 Body mass index (BMI) 19 or less, adult: Secondary | ICD-10-CM | POA: Diagnosis not present

## 2017-04-14 DIAGNOSIS — I10 Essential (primary) hypertension: Secondary | ICD-10-CM | POA: Diagnosis not present

## 2017-04-14 DIAGNOSIS — Z131 Encounter for screening for diabetes mellitus: Secondary | ICD-10-CM | POA: Diagnosis not present

## 2017-04-14 DIAGNOSIS — J440 Chronic obstructive pulmonary disease with acute lower respiratory infection: Secondary | ICD-10-CM | POA: Diagnosis not present

## 2017-04-14 DIAGNOSIS — Z79899 Other long term (current) drug therapy: Secondary | ICD-10-CM | POA: Diagnosis not present

## 2017-04-14 DIAGNOSIS — K210 Gastro-esophageal reflux disease with esophagitis: Secondary | ICD-10-CM | POA: Diagnosis not present

## 2017-04-14 DIAGNOSIS — E440 Moderate protein-calorie malnutrition: Secondary | ICD-10-CM | POA: Diagnosis not present

## 2017-04-14 DIAGNOSIS — Z0000 Encounter for general adult medical examination without abnormal findings: Secondary | ICD-10-CM | POA: Diagnosis not present

## 2017-04-14 DIAGNOSIS — G40401 Other generalized epilepsy and epileptic syndromes, not intractable, with status epilepticus: Secondary | ICD-10-CM | POA: Diagnosis not present

## 2017-04-14 DIAGNOSIS — Z1389 Encounter for screening for other disorder: Secondary | ICD-10-CM | POA: Diagnosis not present

## 2017-05-03 DIAGNOSIS — J441 Chronic obstructive pulmonary disease with (acute) exacerbation: Secondary | ICD-10-CM | POA: Diagnosis not present

## 2017-05-03 DIAGNOSIS — K922 Gastrointestinal hemorrhage, unspecified: Secondary | ICD-10-CM | POA: Diagnosis not present

## 2017-05-03 DIAGNOSIS — J449 Chronic obstructive pulmonary disease, unspecified: Secondary | ICD-10-CM | POA: Diagnosis not present

## 2017-05-03 DIAGNOSIS — M6281 Muscle weakness (generalized): Secondary | ICD-10-CM | POA: Diagnosis not present

## 2017-06-03 DIAGNOSIS — M6281 Muscle weakness (generalized): Secondary | ICD-10-CM | POA: Diagnosis not present

## 2017-06-03 DIAGNOSIS — J449 Chronic obstructive pulmonary disease, unspecified: Secondary | ICD-10-CM | POA: Diagnosis not present

## 2017-06-03 DIAGNOSIS — J441 Chronic obstructive pulmonary disease with (acute) exacerbation: Secondary | ICD-10-CM | POA: Diagnosis not present

## 2017-06-03 DIAGNOSIS — K922 Gastrointestinal hemorrhage, unspecified: Secondary | ICD-10-CM | POA: Diagnosis not present

## 2017-07-04 DIAGNOSIS — J441 Chronic obstructive pulmonary disease with (acute) exacerbation: Secondary | ICD-10-CM | POA: Diagnosis not present

## 2017-07-04 DIAGNOSIS — J449 Chronic obstructive pulmonary disease, unspecified: Secondary | ICD-10-CM | POA: Diagnosis not present

## 2017-07-04 DIAGNOSIS — M6281 Muscle weakness (generalized): Secondary | ICD-10-CM | POA: Diagnosis not present

## 2017-07-04 DIAGNOSIS — K922 Gastrointestinal hemorrhage, unspecified: Secondary | ICD-10-CM | POA: Diagnosis not present

## 2017-08-01 DIAGNOSIS — J440 Chronic obstructive pulmonary disease with acute lower respiratory infection: Secondary | ICD-10-CM | POA: Diagnosis not present

## 2017-08-01 DIAGNOSIS — R630 Anorexia: Secondary | ICD-10-CM | POA: Diagnosis not present

## 2017-08-01 DIAGNOSIS — K210 Gastro-esophageal reflux disease with esophagitis: Secondary | ICD-10-CM | POA: Diagnosis not present

## 2017-08-01 DIAGNOSIS — G40401 Other generalized epilepsy and epileptic syndromes, not intractable, with status epilepticus: Secondary | ICD-10-CM | POA: Diagnosis not present

## 2017-08-01 DIAGNOSIS — E440 Moderate protein-calorie malnutrition: Secondary | ICD-10-CM | POA: Diagnosis not present

## 2017-08-01 DIAGNOSIS — Z681 Body mass index (BMI) 19 or less, adult: Secondary | ICD-10-CM | POA: Diagnosis not present

## 2017-08-01 DIAGNOSIS — L853 Xerosis cutis: Secondary | ICD-10-CM | POA: Diagnosis not present

## 2017-08-03 DIAGNOSIS — J449 Chronic obstructive pulmonary disease, unspecified: Secondary | ICD-10-CM | POA: Diagnosis not present

## 2017-08-03 DIAGNOSIS — J441 Chronic obstructive pulmonary disease with (acute) exacerbation: Secondary | ICD-10-CM | POA: Diagnosis not present

## 2017-08-03 DIAGNOSIS — K922 Gastrointestinal hemorrhage, unspecified: Secondary | ICD-10-CM | POA: Diagnosis not present

## 2017-08-03 DIAGNOSIS — M6281 Muscle weakness (generalized): Secondary | ICD-10-CM | POA: Diagnosis not present

## 2017-09-03 DIAGNOSIS — J449 Chronic obstructive pulmonary disease, unspecified: Secondary | ICD-10-CM | POA: Diagnosis not present

## 2017-09-03 DIAGNOSIS — M6281 Muscle weakness (generalized): Secondary | ICD-10-CM | POA: Diagnosis not present

## 2017-09-03 DIAGNOSIS — J441 Chronic obstructive pulmonary disease with (acute) exacerbation: Secondary | ICD-10-CM | POA: Diagnosis not present

## 2017-09-03 DIAGNOSIS — K922 Gastrointestinal hemorrhage, unspecified: Secondary | ICD-10-CM | POA: Diagnosis not present

## 2017-10-03 DIAGNOSIS — J441 Chronic obstructive pulmonary disease with (acute) exacerbation: Secondary | ICD-10-CM | POA: Diagnosis not present

## 2017-10-03 DIAGNOSIS — K922 Gastrointestinal hemorrhage, unspecified: Secondary | ICD-10-CM | POA: Diagnosis not present

## 2017-10-03 DIAGNOSIS — J449 Chronic obstructive pulmonary disease, unspecified: Secondary | ICD-10-CM | POA: Diagnosis not present

## 2017-10-03 DIAGNOSIS — M6281 Muscle weakness (generalized): Secondary | ICD-10-CM | POA: Diagnosis not present

## 2017-10-21 ENCOUNTER — Encounter (HOSPITAL_COMMUNITY): Payer: Self-pay | Admitting: Emergency Medicine

## 2017-10-21 ENCOUNTER — Inpatient Hospital Stay (HOSPITAL_COMMUNITY): Payer: Medicare HMO

## 2017-10-21 ENCOUNTER — Inpatient Hospital Stay (HOSPITAL_COMMUNITY)
Admission: EM | Admit: 2017-10-21 | Discharge: 2017-11-21 | DRG: 871 | Disposition: E | Payer: Medicare HMO | Attending: Emergency Medicine | Admitting: Emergency Medicine

## 2017-10-21 ENCOUNTER — Emergency Department (HOSPITAL_COMMUNITY): Payer: Medicare HMO

## 2017-10-21 DIAGNOSIS — Z9081 Acquired absence of spleen: Secondary | ICD-10-CM

## 2017-10-21 DIAGNOSIS — J449 Chronic obstructive pulmonary disease, unspecified: Secondary | ICD-10-CM | POA: Diagnosis present

## 2017-10-21 DIAGNOSIS — L89210 Pressure ulcer of right hip, unstageable: Secondary | ICD-10-CM | POA: Diagnosis present

## 2017-10-21 DIAGNOSIS — Z801 Family history of malignant neoplasm of trachea, bronchus and lung: Secondary | ICD-10-CM

## 2017-10-21 DIAGNOSIS — L03116 Cellulitis of left lower limb: Secondary | ICD-10-CM | POA: Diagnosis present

## 2017-10-21 DIAGNOSIS — I2609 Other pulmonary embolism with acute cor pulmonale: Secondary | ICD-10-CM | POA: Diagnosis not present

## 2017-10-21 DIAGNOSIS — R6521 Severe sepsis with septic shock: Secondary | ICD-10-CM | POA: Diagnosis not present

## 2017-10-21 DIAGNOSIS — G253 Myoclonus: Secondary | ICD-10-CM | POA: Diagnosis not present

## 2017-10-21 DIAGNOSIS — I214 Non-ST elevation (NSTEMI) myocardial infarction: Secondary | ICD-10-CM | POA: Diagnosis not present

## 2017-10-21 DIAGNOSIS — D638 Anemia in other chronic diseases classified elsewhere: Secondary | ICD-10-CM | POA: Diagnosis present

## 2017-10-21 DIAGNOSIS — Z4682 Encounter for fitting and adjustment of non-vascular catheter: Secondary | ICD-10-CM | POA: Diagnosis not present

## 2017-10-21 DIAGNOSIS — A4102 Sepsis due to Methicillin resistant Staphylococcus aureus: Secondary | ICD-10-CM | POA: Diagnosis not present

## 2017-10-21 DIAGNOSIS — N170 Acute kidney failure with tubular necrosis: Secondary | ICD-10-CM | POA: Diagnosis not present

## 2017-10-21 DIAGNOSIS — L89150 Pressure ulcer of sacral region, unstageable: Secondary | ICD-10-CM | POA: Diagnosis present

## 2017-10-21 DIAGNOSIS — I469 Cardiac arrest, cause unspecified: Secondary | ICD-10-CM | POA: Diagnosis not present

## 2017-10-21 DIAGNOSIS — L89200 Pressure ulcer of unspecified hip, unstageable: Secondary | ICD-10-CM | POA: Insufficient documentation

## 2017-10-21 DIAGNOSIS — I959 Hypotension, unspecified: Secondary | ICD-10-CM | POA: Diagnosis present

## 2017-10-21 DIAGNOSIS — L89220 Pressure ulcer of left hip, unstageable: Secondary | ICD-10-CM | POA: Diagnosis present

## 2017-10-21 DIAGNOSIS — I10 Essential (primary) hypertension: Secondary | ICD-10-CM | POA: Diagnosis present

## 2017-10-21 DIAGNOSIS — J9600 Acute respiratory failure, unspecified whether with hypoxia or hypercapnia: Secondary | ICD-10-CM

## 2017-10-21 DIAGNOSIS — L03115 Cellulitis of right lower limb: Secondary | ICD-10-CM | POA: Diagnosis present

## 2017-10-21 DIAGNOSIS — R0989 Other specified symptoms and signs involving the circulatory and respiratory systems: Secondary | ICD-10-CM | POA: Diagnosis not present

## 2017-10-21 DIAGNOSIS — I4891 Unspecified atrial fibrillation: Secondary | ICD-10-CM | POA: Diagnosis present

## 2017-10-21 DIAGNOSIS — Z978 Presence of other specified devices: Secondary | ICD-10-CM

## 2017-10-21 DIAGNOSIS — Z96649 Presence of unspecified artificial hip joint: Secondary | ICD-10-CM | POA: Diagnosis present

## 2017-10-21 DIAGNOSIS — G931 Anoxic brain damage, not elsewhere classified: Secondary | ICD-10-CM | POA: Diagnosis not present

## 2017-10-21 DIAGNOSIS — F17210 Nicotine dependence, cigarettes, uncomplicated: Secondary | ICD-10-CM | POA: Diagnosis present

## 2017-10-21 DIAGNOSIS — J9601 Acute respiratory failure with hypoxia: Secondary | ICD-10-CM | POA: Diagnosis not present

## 2017-10-21 DIAGNOSIS — E872 Acidosis: Secondary | ICD-10-CM | POA: Diagnosis not present

## 2017-10-21 DIAGNOSIS — R64 Cachexia: Secondary | ICD-10-CM | POA: Diagnosis present

## 2017-10-21 DIAGNOSIS — I2699 Other pulmonary embolism without acute cor pulmonale: Secondary | ICD-10-CM | POA: Diagnosis not present

## 2017-10-21 DIAGNOSIS — Z880 Allergy status to penicillin: Secondary | ICD-10-CM

## 2017-10-21 DIAGNOSIS — E43 Unspecified severe protein-calorie malnutrition: Secondary | ICD-10-CM

## 2017-10-21 DIAGNOSIS — Z4659 Encounter for fitting and adjustment of other gastrointestinal appliance and device: Secondary | ICD-10-CM

## 2017-10-21 DIAGNOSIS — R4020 Unspecified coma: Secondary | ICD-10-CM | POA: Diagnosis not present

## 2017-10-21 DIAGNOSIS — R188 Other ascites: Secondary | ICD-10-CM | POA: Diagnosis not present

## 2017-10-21 DIAGNOSIS — R7881 Bacteremia: Secondary | ICD-10-CM

## 2017-10-21 DIAGNOSIS — G312 Degeneration of nervous system due to alcohol: Secondary | ICD-10-CM | POA: Diagnosis present

## 2017-10-21 DIAGNOSIS — Z681 Body mass index (BMI) 19 or less, adult: Secondary | ICD-10-CM | POA: Diagnosis not present

## 2017-10-21 DIAGNOSIS — F1010 Alcohol abuse, uncomplicated: Secondary | ICD-10-CM | POA: Diagnosis present

## 2017-10-21 DIAGNOSIS — Z66 Do not resuscitate: Secondary | ICD-10-CM | POA: Diagnosis not present

## 2017-10-21 DIAGNOSIS — R569 Unspecified convulsions: Secondary | ICD-10-CM | POA: Diagnosis not present

## 2017-10-21 LAB — I-STAT ARTERIAL BLOOD GAS, ED
Acid-base deficit: 9 mmol/L — ABNORMAL HIGH (ref 0.0–2.0)
Acid-base deficit: 7 mmol/L — ABNORMAL HIGH (ref 0.0–2.0)
Bicarbonate: 19.8 mmol/L — ABNORMAL LOW (ref 20.0–28.0)
Bicarbonate: 18.6 mmol/L — ABNORMAL LOW (ref 20.0–28.0)
O2 Saturation: 100 %
O2 Saturation: 98 %
TCO2: 21 mmol/L — ABNORMAL LOW (ref 22–32)
TCO2: 20 mmol/L — ABNORMAL LOW (ref 22–32)
pCO2 arterial: 54.2 mmHg — ABNORMAL HIGH (ref 32.0–48.0)
pCO2 arterial: 37.3 mmHg (ref 32.0–48.0)
pH, Arterial: 7.17 — CL (ref 7.350–7.450)
pH, Arterial: 7.306 — ABNORMAL LOW (ref 7.350–7.450)
pO2, Arterial: 427 mmHg — ABNORMAL HIGH (ref 83.0–108.0)
pO2, Arterial: 117 mmHg — ABNORMAL HIGH (ref 83.0–108.0)

## 2017-10-21 LAB — I-STAT CG4 LACTIC ACID, ED
Lactic Acid, Venous: 5.49 mmol/L (ref 0.5–1.9)
Lactic Acid, Venous: 2.25 mmol/L (ref 0.5–1.9)

## 2017-10-21 LAB — BASIC METABOLIC PANEL
Anion gap: 7 (ref 5–15)
BUN: 21 mg/dL — AB (ref 6–20)
CHLORIDE: 104 mmol/L (ref 101–111)
CO2: 23 mmol/L (ref 22–32)
CREATININE: 1.35 mg/dL — AB (ref 0.61–1.24)
Calcium: 6.9 mg/dL — ABNORMAL LOW (ref 8.9–10.3)
GFR calc Af Amer: >60 mL/min (ref >60)
GFR calc non Af Amer: 57 mL/min — ABNORMAL LOW (ref >60)
Glucose, Bld: 167 mg/dL — ABNORMAL HIGH (ref 65–99)
Potassium: 3.6 mmol/L (ref 3.5–5.1)
SODIUM: 134 mmol/L — AB (ref 135–145)

## 2017-10-21 LAB — CBC WITH DIFFERENTIAL/PLATELET
Basophils Absolute: 0 10*3/uL (ref 0.0–0.1)
Basophils Relative: 0 %
Eosinophils Absolute: 0 10*3/uL (ref 0.0–0.7)
Eosinophils Relative: 0 %
HCT: 37.7 % — ABNORMAL LOW (ref 39.0–52.0)
Hemoglobin: 11.3 g/dL — ABNORMAL LOW (ref 13.0–17.0)
Lymphocytes Relative: 7 %
Lymphs Abs: 1.4 10*3/uL (ref 0.7–4.0)
MCH: 28.3 pg (ref 26.0–34.0)
MCHC: 30 g/dL (ref 30.0–36.0)
MCV: 94.5 fL (ref 78.0–100.0)
Monocytes Absolute: 0.7 10*3/uL (ref 0.1–1.0)
Monocytes Relative: 3 %
Neutro Abs: 19.4 10*3/uL — ABNORMAL HIGH (ref 1.7–7.7)
Neutrophils Relative %: 90 %
Platelets: 296 10*3/uL (ref 150–400)
RBC: 3.99 MIL/uL — ABNORMAL LOW (ref 4.22–5.81)
RDW: 15.7 % — ABNORMAL HIGH (ref 11.5–15.5)
WBC: 21.6 10*3/uL — ABNORMAL HIGH (ref 4.0–10.5)

## 2017-10-21 LAB — GLUCOSE, CAPILLARY
GLUCOSE-CAPILLARY: 74 mg/dL (ref 65–99)
Glucose-Capillary: 127 mg/dL — ABNORMAL HIGH (ref 65–99)
Glucose-Capillary: 253 mg/dL — ABNORMAL HIGH (ref 65–99)
Glucose-Capillary: 70 mg/dL (ref 65–99)

## 2017-10-21 LAB — URINALYSIS, ROUTINE W REFLEX MICROSCOPIC
Bilirubin Urine: NEGATIVE
Glucose, UA: NEGATIVE mg/dL
Ketones, ur: NEGATIVE mg/dL
Nitrite: NEGATIVE
Protein, ur: NEGATIVE mg/dL
Specific Gravity, Urine: >1.046 — ABNORMAL HIGH (ref 1.005–1.030)
pH: 5 (ref 5.0–8.0)

## 2017-10-21 LAB — CREATININE, SERUM
Creatinine, Ser: 1.16 mg/dL (ref 0.61–1.24)
GFR calc Af Amer: >60 mL/min (ref >60)
GFR calc non Af Amer: >60 mL/min (ref >60)

## 2017-10-21 LAB — CBC
HEMATOCRIT: 39.9 % (ref 39.0–52.0)
HEMOGLOBIN: 12.3 g/dL — AB (ref 13.0–17.0)
MCH: 28.5 pg (ref 26.0–34.0)
MCHC: 30.8 g/dL (ref 30.0–36.0)
MCV: 92.4 fL (ref 78.0–100.0)
Platelets: 329 10*3/uL (ref 150–400)
RBC: 4.32 MIL/uL (ref 4.22–5.81)
RDW: 15.7 % — ABNORMAL HIGH (ref 11.5–15.5)
WBC: 32.1 10*3/uL — ABNORMAL HIGH (ref 4.0–10.5)

## 2017-10-21 LAB — COMPREHENSIVE METABOLIC PANEL
ALT: 41 U/L (ref 17–63)
AST: 106 U/L — ABNORMAL HIGH (ref 15–41)
Albumin: 1.6 g/dL — ABNORMAL LOW (ref 3.5–5.0)
Alkaline Phosphatase: 59 U/L (ref 38–126)
Anion gap: 10 (ref 5–15)
BUN: 16 mg/dL (ref 6–20)
CO2: 19 mmol/L — ABNORMAL LOW (ref 22–32)
Calcium: 6.9 mg/dL — ABNORMAL LOW (ref 8.9–10.3)
Chloride: 111 mmol/L (ref 101–111)
Creatinine, Ser: 1.1 mg/dL (ref 0.61–1.24)
GFR calc Af Amer: >60 mL/min (ref >60)
GFR calc non Af Amer: >60 mL/min (ref >60)
Glucose, Bld: 101 mg/dL — ABNORMAL HIGH (ref 65–99)
Potassium: 4.1 mmol/L (ref 3.5–5.1)
Sodium: 140 mmol/L (ref 135–145)
Total Bilirubin: 0.8 mg/dL (ref 0.3–1.2)
Total Protein: 4.6 g/dL — ABNORMAL LOW (ref 6.5–8.1)

## 2017-10-21 LAB — LACTIC ACID, PLASMA
Lactic Acid, Venous: 2.3 mmol/L (ref 0.5–1.9)
Lactic Acid, Venous: 3 mmol/L (ref 0.5–1.9)

## 2017-10-21 LAB — RAPID URINE DRUG SCREEN, HOSP PERFORMED
Amphetamines: NOT DETECTED
Barbiturates: NOT DETECTED
Benzodiazepines: POSITIVE — AB
Cocaine: NOT DETECTED
Opiates: NOT DETECTED
Tetrahydrocannabinol: NOT DETECTED

## 2017-10-21 LAB — PROTIME-INR
INR: 1.3
Prothrombin Time: 16.1 seconds — ABNORMAL HIGH (ref 11.4–15.2)

## 2017-10-21 LAB — MRSA PCR SCREENING: MRSA by PCR: NEGATIVE

## 2017-10-21 LAB — TROPONIN I
TROPONIN I: 0.24 ng/mL — AB (ref <0.03)
TROPONIN I: 0.41 ng/mL — AB (ref <0.03)
Troponin I: 0.06 ng/mL (ref <0.03)

## 2017-10-21 LAB — ETHANOL: Alcohol, Ethyl (B): <10 mg/dL (ref <10)

## 2017-10-21 LAB — APTT: aPTT: 33 seconds (ref 24–36)

## 2017-10-21 LAB — MAGNESIUM: Magnesium: 1.7 mg/dL (ref 1.7–2.4)

## 2017-10-21 LAB — AMMONIA: Ammonia: 19 umol/L (ref 9–35)

## 2017-10-21 MED ORDER — DEXTROSE 50 % IV SOLN
1.0000 | Freq: Once | INTRAVENOUS | Status: AC
  Administered 2017-10-21: 50 mL via INTRAVENOUS

## 2017-10-21 MED ORDER — ASPIRIN 81 MG PO CHEW
324.0000 mg | CHEWABLE_TABLET | ORAL | Status: AC
  Administered 2017-10-21: 324 mg via ORAL
  Filled 2017-10-21: qty 4

## 2017-10-21 MED ORDER — HEPARIN (PORCINE) IN NACL 100-0.45 UNIT/ML-% IJ SOLN
1100.0000 [IU]/h | INTRAMUSCULAR | Status: DC
  Administered 2017-10-21 – 2017-10-22 (×2): 850 [IU]/h via INTRAVENOUS
  Filled 2017-10-21 (×2): qty 250

## 2017-10-21 MED ORDER — PROPOFOL 1000 MG/100ML IV EMUL: 5.0000 ug/kg/min | Freq: Once | INTRAVENOUS | Status: AC

## 2017-10-21 MED ORDER — SODIUM CHLORIDE 0.9% FLUSH: 10.0000 mL | INTRAVENOUS | Status: DC | PRN

## 2017-10-21 MED ORDER — NOREPINEPHRINE BITARTRATE 1 MG/ML IV SOLN
0.0000 ug/min | Freq: Once | INTRAVENOUS | Status: AC
  Administered 2017-10-21: 14 ug/min via INTRAVENOUS

## 2017-10-21 MED ORDER — DEXTROSE 50 % IV SOLN
INTRAVENOUS | Status: AC
  Administered 2017-10-21: 50 mL via INTRAVENOUS
  Filled 2017-10-21: qty 50

## 2017-10-21 MED ORDER — PANTOPRAZOLE SODIUM 40 MG IV SOLR
40.0000 mg | Freq: Two times a day (BID) | INTRAVENOUS | Status: DC
Start: 2017-10-21 — End: 2017-10-24
  Administered 2017-10-21 – 2017-10-23 (×4): 40 mg via INTRAVENOUS
  Filled 2017-10-21 (×4): qty 40

## 2017-10-21 MED ORDER — PROPOFOL 1000 MG/100ML IV EMUL
INTRAVENOUS | Status: AC
  Filled 2017-10-21: qty 100

## 2017-10-21 MED ORDER — PROPOFOL 1000 MG/100ML IV EMUL
5.0000 ug/kg/min | INTRAVENOUS | Status: DC
  Administered 2017-10-22: 45 ug/kg/min via INTRAVENOUS
  Administered 2017-10-22: 30 ug/kg/min via INTRAVENOUS
  Administered 2017-10-22 – 2017-10-23 (×2): 40 ug/kg/min via INTRAVENOUS
  Filled 2017-10-21 (×6): qty 100

## 2017-10-21 MED ORDER — ORAL CARE MOUTH RINSE
15.0000 mL | OROMUCOSAL | Status: DC
  Administered 2017-10-21 – 2017-10-23 (×19): 15 mL via OROMUCOSAL

## 2017-10-21 MED ORDER — SODIUM CHLORIDE 0.9 % IV BOLUS (SEPSIS)
1000.0000 mL | Freq: Once | INTRAVENOUS | Status: AC
  Administered 2017-10-21: 1000 mL via INTRAVENOUS

## 2017-10-21 MED ORDER — SODIUM CHLORIDE 0.9 % IV BOLUS (SEPSIS): 1000.0000 mL | Freq: Once | INTRAVENOUS | Status: DC

## 2017-10-21 MED ORDER — SODIUM CHLORIDE 0.9 % IV SOLN
INTRAVENOUS | Status: DC
  Administered 2017-10-21: 19:00:00 via INTRAVENOUS

## 2017-10-21 MED ORDER — CHLORHEXIDINE GLUCONATE 0.12% ORAL RINSE (MEDLINE KIT)
15.0000 mL | Freq: Two times a day (BID) | OROMUCOSAL | Status: DC
  Administered 2017-10-21 – 2017-10-23 (×4): 15 mL via OROMUCOSAL

## 2017-10-21 MED ORDER — ASPIRIN 300 MG RE SUPP: 300.0000 mg | RECTAL | Status: AC

## 2017-10-21 MED ORDER — IOPAMIDOL (ISOVUE-370) INJECTION 76%
INTRAVENOUS | Status: AC
  Administered 2017-10-21: 100 mL
  Filled 2017-10-21: qty 100

## 2017-10-21 MED ORDER — FAMOTIDINE IN NACL 20-0.9 MG/50ML-% IV SOLN
20.0000 mg | Freq: Two times a day (BID) | INTRAVENOUS | Status: DC
  Administered 2017-10-21: 20 mg via INTRAVENOUS
  Filled 2017-10-21: qty 50

## 2017-10-21 MED ORDER — HEPARIN BOLUS VIA INFUSION
3000.0000 [IU] | Freq: Once | INTRAVENOUS | Status: AC
Start: 2017-10-21 — End: 2017-10-21
  Administered 2017-10-21: 3000 [IU] via INTRAVENOUS
  Filled 2017-10-21: qty 3000

## 2017-10-21 MED ORDER — HYDRALAZINE HCL 20 MG/ML IJ SOLN: 5.0000 mg | INTRAMUSCULAR | Status: DC | PRN

## 2017-10-21 MED ORDER — NOREPINEPHRINE BITARTRATE 1 MG/ML IV SOLN
0.0000 ug/min | INTRAVENOUS | Status: DC
  Administered 2017-10-21: 10 ug/min via INTRAVENOUS
  Administered 2017-10-22: 3 ug/min via INTRAVENOUS
  Filled 2017-10-21: qty 4

## 2017-10-21 MED ORDER — PROPOFOL 1000 MG/100ML IV EMUL
5.0000 ug/kg/min | Freq: Once | INTRAVENOUS | Status: AC
  Administered 2017-10-21: 10 ug/kg/min via INTRAVENOUS

## 2017-10-21 MED ORDER — SODIUM CHLORIDE 0.9 % IV SOLN
INTRAVENOUS | Status: DC
  Administered 2017-10-21 – 2017-10-23 (×2): via INTRAVENOUS

## 2017-10-21 MED ORDER — FENTANYL CITRATE (PF) 100 MCG/2ML IJ SOLN
INTRAMUSCULAR | Status: AC
  Filled 2017-10-21: qty 2

## 2017-10-21 MED ORDER — NOREPINEPHRINE BITARTRATE 1 MG/ML IV SOLN
0.0000 ug/min | INTRAVENOUS | Status: DC
  Filled 2017-10-21 (×2): qty 4

## 2017-10-21 MED ORDER — ASPIRIN 300 MG RE SUPP: 300.0000 mg | RECTAL | Status: DC

## 2017-10-21 MED ORDER — SODIUM CHLORIDE 0.9 % IV SOLN: 250.0000 mL | INTRAVENOUS | Status: DC | PRN

## 2017-10-21 MED ORDER — PIPERACILLIN-TAZOBACTAM 3.375 G IVPB 30 MIN
3.3750 g | Freq: Once | INTRAVENOUS | Status: AC
  Administered 2017-10-21: 3.375 g via INTRAVENOUS
  Filled 2017-10-21: qty 50

## 2017-10-21 MED ORDER — SODIUM CHLORIDE 0.9% FLUSH
10.0000 mL | Freq: Two times a day (BID) | INTRAVENOUS | Status: DC
  Administered 2017-10-21 – 2017-10-23 (×4): 10 mL

## 2017-10-21 MED ORDER — VANCOMYCIN HCL IN DEXTROSE 1-5 GM/200ML-% IV SOLN
1000.0000 mg | Freq: Once | INTRAVENOUS | Status: AC
  Administered 2017-10-21: 1000 mg via INTRAVENOUS
  Filled 2017-10-21: qty 200

## 2017-10-21 MED ORDER — ENOXAPARIN SODIUM 40 MG/0.4ML ~~LOC~~ SOLN: 40.0000 mg | SUBCUTANEOUS | Status: DC

## 2017-10-21 MED ORDER — CHLORHEXIDINE GLUCONATE CLOTH 2 % EX PADS
6.0000 | MEDICATED_PAD | Freq: Every day | CUTANEOUS | Status: DC
  Administered 2017-10-22 – 2017-10-23 (×2): 6 via TOPICAL

## 2017-10-21 MED ORDER — IOPAMIDOL (ISOVUE-370) INJECTION 76%
INTRAVENOUS | Status: AC
  Filled 2017-10-21: qty 100

## 2017-10-21 MED ORDER — FENTANYL CITRATE (PF) 100 MCG/2ML IJ SOLN
50.0000 ug | Freq: Once | INTRAMUSCULAR | Status: AC
  Administered 2017-10-21: 50 ug via INTRAVENOUS

## 2017-10-21 NOTE — ED Notes (Signed)
Attempted report 

## 2017-10-21 NOTE — ED Notes (Signed)
Girlfriend remains at bedside , temp up to 35.1 c pt remains on bare hugger

## 2017-10-21 NOTE — ED Notes (Signed)
Pt converted to SR.

## 2017-10-21 NOTE — Progress Notes (Signed)
CBG 70. Scatliffe, MD at bedside. Verbal orders to give an AMP of D50, recheck CBG, and keep on q4 CBG checks.

## 2017-10-21 NOTE — Procedures (Signed)
Arterial Catheter Insertion Procedure Note Victoriano LainWilliam R Cuppett [id number] 1961-04-28  Procedure: Insertion of Arterial Catheter  Indications: Blood pressure monitoring  Procedure Details Consent: Unable to obtain consent because of altered level of consciousness.and emergent necessity Time Out: Verified patient identification, verified procedure, site/side was marked, verified correct patient position, special equipment/implants available, medications/allergies/relevent history reviewed, required imaging and test results available.  Performed  Maximum sterile technique was used including antiseptics, cap, gloves, hand hygiene, mask and sheet. Skin prep: Chlorhexidine; local anesthetic administered 20 gauge catheter was inserted into left femoral artery using the Seldinger technique.  Evaluation Doppler used to locate as pulse was weak on palpation Ultrasound guided insertion used on second attempt Blood flow good; BP tracing good. Complications: No apparent complications.   Gypsy BalsamKristen D Scatliffe 11/02/2017

## 2017-10-21 NOTE — Progress Notes (Signed)
Patient no longer having myoclonic jerking to stimuli while on 30 mcg of Propofol. This RN decreased propofol to . Blood pressure via cuff is 97/84, Arterial line reading 102/72. Elink, MD called and notified up change and narrow cuff pressure. Verbal orders to continue to titrate down Propofol and monitor neuro status with titrations.

## 2017-10-21 NOTE — Progress Notes (Signed)
Pt transported on vent from ED to CT and then to 2H22.  Pt's vitals remained stable throughout.

## 2017-10-21 NOTE — H&P (Addendum)
PULMONARY / CRITICAL CARE MEDICINE   Name: Raymond Fox MRN: [id number] DOB: 23-Mar-1961    ADMISSION DATE:  10/29/2017    CHIEF COMPLAINT: Out of hospital cardiopulmonary arrest  HISTORY OF PRESENT ILLNESS:   Patient is a chronic alcoholic with a history of substance abuse and encephalopathy for the past 2 weeks has shown very little interest in self-care.  He has remained in bed and not used his scheduled inhalers.  This morning he was breathing normally when first seen by his girlfriend who then went to get a cup of coffee.  When she returned he was apneic and she initiated CPR when EMS arrived he was given 1 mg of epinephrine and 2 A of Narcan he was converted from asystole to PEA and subsequently to sinus rhythm.  He was started on levo fed in the department of emergency medicine where he has since converted to atrial fibrillation with a rapid ventricular response.  He has not awakened after this event.  His girlfriend denies any chest pain or unusual shortness of breath prior to the arrest.  She is not aware that he ingested any unusual street drugs as part of the New Year's holiday.  PAST MEDICAL HISTORY :  He  has a past medical history of Alcoholic encephalopathy (HCC) (11/03/2012), Anemia, Anemia associated with acute blood loss (11/01/2012), Asthma, Bleeding duodenal ulcer (11/01/2012), Elevated LFTs, ETOH abuse, Gastric ulcer (11/01/2012), Gastritis and duodenitis, Helicobacter pylori antibody positive (11/03/2012), Hypertension, Pneumonia (2010), Polysubstance abuse (HCC), Rectal bleeding, and Shortness of breath.  PAST SURGICAL HISTORY: He  has a past surgical history that includes Joint replacement; Total hip arthroplasty (?2013); Back surgery; Colon surgery; Esophagogastroduodenoscopy (egd) with propofol (10/30/2012); biopsy (10/30/2012); Splenectomy; Colonoscopy with propofol (N/A, 12/15/2012); Esophagogastroduodenoscopy (egd) with propofol (N/A, 12/15/2012); and Esophagogastroduodenoscopy  (egd) with propofol (N/A, 11/20/2016).  Allergies  Allergen Reactions  . Penicillins Rash    Has patient had a PCN reaction causing immediate rash, facial/tongue/throat swelling, SOB or lightheadedness with hypotension: Yes Has patient had a PCN reaction causing severe rash involving mucus membranes or skin necrosis: No Has patient had a PCN reaction that required hospitalization No  Tolerated zosyn for 3 days without problems 1/23 Has patient had a PCN reaction occurring within the last 10 years: No If all of the above answers are "NO", then may proceed with Cephalosporin use. 1    No current facility-administered medications on file prior to encounter.    Current Outpatient Medications on File Prior to Encounter  Medication Sig  . albuterol (PROVENTIL) (2.5 MG/3ML) 0.083% nebulizer solution Inhale 3 mLs into the lungs every 6 (six) hours as needed.  . busPIRone (BUSPAR) 10 MG tablet Take 10 mg by mouth 3 (three) times daily.  . Cholecalciferol (VITAMIN D) 2000 units tablet Take 2,000 Units by mouth daily.  . folic acid (FOLVITE) 400 MCG tablet Take 800 mcg by mouth daily.  Marland Kitchen gabapentin (NEURONTIN) 100 MG capsule Take 100 mg by mouth 3 (three) times daily.  Marland Kitchen ketoconazole (NIZORAL) 2 % cream Apply 1 application topically 2 (two) times daily. For 14 days  . levETIRAcetam (KEPPRA) 250 MG tablet Take 250 mg by mouth 2 (two) times daily.  . megestrol (MEGACE) 40 MG tablet Take 40 mg by mouth 2 (two) times daily.  . pantoprazole (PROTONIX) 40 MG tablet Take 1 tablet (40 mg total) by mouth 2 (two) times daily before a meal.  . ferrous sulfate 325 (65 FE) MG tablet Take 1 tablet (325 mg total) by  mouth 3 (three) times daily with meals. (Patient not taking: Reported on 11/05/2017)  . folic acid (FOLVITE) 1 MG tablet Take 1 tablet (1 mg total) by mouth daily. (Patient not taking: Reported on 10/26/2017)  . LORazepam (ATIVAN) 1 MG tablet Take 1 tablet (1 mg total) by mouth 3 (three) times daily as  needed for anxiety. (Patient not taking: Reported on 11/18/2017)  . Multiple Vitamin (MULTIVITAMIN WITH MINERALS) TABS tablet Take 1 tablet by mouth daily. (Patient not taking: Reported on 11/03/2017)  . nicotine (NICODERM CQ - DOSED IN MG/24 HOURS) 21 mg/24hr patch Place 1 patch (21 mg total) onto the skin daily. (Patient not taking: Reported on 11/17/2017)  . thiamine 100 MG tablet Take 1 tablet (100 mg total) by mouth daily. (Patient not taking: Reported on 11/01/2017)    FAMILY HISTORY:  His indicated that his mother is alive. He indicated that his father is deceased. He indicated that one of his two sisters is deceased. He indicated that two of his three brothers are deceased.   SOCIAL HISTORY: He  reports that he has been smoking cigarettes.  He has a 30.00 pack-year smoking history. he has never used smokeless tobacco. He reports that he drinks alcohol. He reports that he uses drugs. Drug: Marijuana.  REVIEW OF SYSTEMS:   Review of systems obtained from the girlfriend reveals that he has very little interest in doing anything.  He was last out of bed 2 weeks ago.  He is not aware that he has any focal neurological deficits, he has been hallucinating seeing dead brothers who are inviting him to come join him.  He normally takes inhalers but is not been complaining of any unusual dyspnea despite the fact that he has stopped the inhalers.  He has not had any recent chest pain.  He does have a history of GI bleeding, but she is not aware that he has had any recently.  SUBJECTIVE:  As above  VITAL SIGNS: BP 104/89   Pulse 71   Temp (!) 97.5 F (36.4 C)   Resp (!) 24   Ht 5\' 8"  (1.727 m)   SpO2 100%   BMI 16.88 kg/m   HEMODYNAMICS:    VENTILATOR SETTINGS: Vent Mode: PRVC FiO2 (%):  [40 %-100 %] 40 % Set Rate:  [18 bmp-24 bmp] 24 bmp Vt Set:  [560 mL] 560 mL PEEP:  [5 cmH20] 5 cmH20 Plateau Pressure:  [25 cmH20-29 cmH20] 29 cmH20  INTAKE / OUTPUT: No intake/output data  recorded.  PHYSICAL EXAMINATION: General: This is a disheveled gentleman who appears to be at least is 56 years.  He is orally intubated and not responsive to voice. Neuro: There is partial eye opening to sternal rub, there is no limb movement.  Is no response to voice.  Pupils are equal at 2 mm and he has a bilateral uppward gaze. Cardiovascular: Does not have significant JVD or dependent edema.  S1 and S2 are irregularly irregular without murmur rub or gallop. Lungs: He is orally intubated and just breathing above the set ventilator rate.  There is symmetric air movement a few scattered rhonchi and no wheezes. Abdomen: The abdomen is soft and nontender without any overt organomegaly masses or tenderness he is anicteric.  There is a large eschar over the left hip and there is an area of warmth fluctuance surrounding the eschar.    LABS:  BMET Recent Labs  Lab 11/04/2017 1000  NA 140  K 4.1  CL 111  CO2  19*  BUN 16  CREATININE 1.10  GLUCOSE 101*    Electrolytes Recent Labs  Lab 11/11/2017 1000  CALCIUM 6.9*  MG 1.7    CBC Recent Labs  Lab 11/07/2017 1000  WBC 21.6*  HGB 11.3*  HCT 37.7*  PLT 296    Coag's No results for input(s): APTT, INR in the last 168 hours.  Sepsis Markers Recent Labs  Lab 11/19/2017 1155  LATICACIDVEN 5.49*    ABG Recent Labs  Lab 11/09/2017 1037 10/31/2017 1437  PHART 7.170* 7.306*  PCO2ART 54.2* 37.3  PO2ART 427.0* 117.0*    Liver Enzymes Recent Labs  Lab 11/12/2017 1000  AST 106*  ALT 41  ALKPHOS 59  BILITOT 0.8  ALBUMIN 1.6*    Cardiac Enzymes Recent Labs  Lab 10/25/2017 1000  TROPONINI 0.06*    Glucose No results for input(s): GLUCAP in the last 168 hours.  Imaging Dg Chest Portable 1 View  Result Date: 10/22/2017 CLINICAL DATA:  57 year old male with a history of intubation, cardiac arrest EXAM: PORTABLE CHEST 1 VIEW COMPARISON:  12/21/2016 FINDINGS: Cardiomediastinal silhouette unchanged. Endotracheal tube terminates  6.5 cm above the carina. Defibrillator pads project over the chest. No large pleural effusion. No confluent airspace disease or pneumothorax. Partial healing of multiple bilateral rib fractures, incompletely visualized. IMPRESSION: Endotracheal tube terminates suitably above the carina. No evidence of confluent airspace disease, pneumothorax, or large pleural effusion. Defibrillator pads on the chest wall. Multiple partially healed/ remodeled bilateral rib fractures. Electronically Signed   By: Gilmer MorJaime  Wagner D.O.   On: 11/10/2017 10:40     STUDIES:  CT scan of the head, CTA of the chest, and CT of the abdomen pelvis specifically to look a potential fluid collection in the left hip are all pending.  CULTURES: Blood, sputum, and urine ordered on 1/1  ANTIBIOTICS: Vancomycin and Zosyn started on 1/1  Lines TUBES: Right femoral triple-lumen placed on 1/1  DISCUSSION: This is a 57 year old with a history of chronic alcohol abuse and chronic alcoholic encephalopathy who at baseline seems to have lost interest in self-care.  For the past 2 weeks he is not left bed.  He was apparently in his normal state of health this morning when seen by his girlfriend who left the room to get a cup of coffee and when she returned he was not breathing.  Bystander CPR was performed and subsequently EMS provided CPR.  Initial rhythm was asystole followed by PEA.  ASSESSMENT / PLAN:  PULMONARY A: There is no overt acute parenchymal disease on the chest x-ray.  He is intubated for airway protection.  Due to the history of prolonged immobility I have ordered a CTA to look for a provocation for his cardiac arrest.  CARDIOVASCULAR A: The etiology of his arrest is not overt.  He has a tox screen positive for benzodiazepines and opiates which may have contributed.  He also has an EKG which shows anterior lateral ST depression.  Will be ruling out an ischemic event with serial enzymes and controlling his ventricular rate  with a modest dose of beta-blocker.   INFECTIOUS A: I am concerned that infection with hypotension and acidosis may have precipitated his arrest.  I am suspicious of his left hip which is fluctuant.  CT scan is pending and if fluid is present we will arrange for it to be drained.    NEUROLOGIC A: Neurological status at this point is extremely poor he does not respond to voice there is partial eye opening to  sternal rub.  I will be obtaining a CT scan of the head in order to look for a structural insult as the provocation for his arrest.  We will be pursuing modest hypothermia hoping to avoid a temperature in excess of 36 degrees for the next 24 hours  FAMILY  -I have spoken extensively with his girlfriend, unfortunately she does not have power of attorney for the patient.  We are right awaiting for the arrival of her mother to make a determination as to the appropriate level of care.  Girlfriend tells me that when he was at [facility] he asked for no acute interventions.  Greater than 45 minutes was spent in the care of this patient today   Penny Pia, MD Pulmonary and Critical Care Medicine [facility] Pager: [phone]  11/11/2017, 2:43 PM   Addendum: CTA shows PE. Beginning heparin. Aware he is guiaic positive.

## 2017-10-21 NOTE — ED Notes (Signed)
Ccm at bedside. 

## 2017-10-21 NOTE — Progress Notes (Signed)
MD Wallace CullensGray notified of patients arrival on 2H. Per MD Wallace CullensGray begin 36 degree hypothermia protocol and continue propofol and levophed. Protocol initiated, labs drawn and family at bedside. Education given to family on patients condition.

## 2017-10-21 NOTE — ED Provider Notes (Signed)
[facility] EMERGENCY DEPARTMENT Provider Note   CSN: [id number] Arrival date & time: 11-04-2017  4098     History   Chief Complaint Chief Complaint  Patient presents with  . Cardiac Arrest    HPI Raymond Fox is a 57 y.o. male.  HPI   57 year old male brought in by EMS after unwitnessed arrest.  Per report, last at his baseline 15 minutes prior to his wife finding him unresponsive.  Wife did not initiate CPR prior to EMS arriving 5 minutes later. On EMS arrival he was in asystole.  He was intubated with a 7.0 ET tube.  CPR was initiated and received epinephrine x1 and 2mg  narcan. EMS reports ROSC with sinus tachycardia on first rhythm check.    Past Medical History:  Diagnosis Date  . Alcoholic encephalopathy (HCC) 11/08/1476  . Anemia   . Anemia associated with acute blood loss 11/01/2012  . Asthma   . Bleeding duodenal ulcer 11/01/2012  . Elevated LFTs   . ETOH abuse   . Gastric ulcer 11/01/2012  . Gastritis and duodenitis   . Helicobacter pylori antibody positive 11/03/2012   But biopsy negative for H. pylori.  Marland Kitchen Hypertension   . Pneumonia 2010  . Polysubstance abuse (HCC)    Rx narcotics, marijuana, etoh  . Rectal bleeding   . Shortness of breath     Patient Active Problem List   Diagnosis Date Noted  . Melena   . Hematemesis without nausea   . GI bleeding 11/12/2016  . Anemia 12/07/2012  . Elevated LFTs 12/07/2012  . Alcoholic encephalopathy (HCC) 29/56/2130  . Helicobacter pylori antibody positive 11/03/2012  . Tachycardia 11/02/2012  . Hypokalemia 11/01/2012  . History of splenectomy 11/01/2012  . Hyponatremia 11/01/2012  . Alcoholic hepatitis 11/01/2012  . Alcohol abuse 11/01/2012  . h/o Bleeding duodenal ulcer 11/01/2012  . Gastric erosion 11/01/2012  . Hypotension 11/01/2012  . Alcohol withdrawal syndrome (HCC) 11/01/2012  . Anemia associated with acute blood loss 11/01/2012  . GI bleed 10/29/2012  . Community acquired  pneumonia 10/29/2012  . Tobacco abuse 10/29/2012    Past Surgical History:  Procedure Laterality Date  . BACK SURGERY    . BIOPSY  10/30/2012   Negative for HPylori x2  . COLON SURGERY     ?pt cannot remember  . COLONOSCOPY WITH PROPOFOL N/A 12/15/2012   Procedure: COLONOSCOPY WITH PROPOFOL;  Surgeon: West Bali, MD;  Location: [facility];  Service: Endoscopy;  Laterality: N/A;  in cecum at 0751; end at 0800 ; total withdrawal time = 9 minutes  . ESOPHAGOGASTRODUODENOSCOPY (EGD) WITH PROPOFOL  10/30/2012   Fields-multiple PUD s/p 2 hemoclips, hiatal hernia  . ESOPHAGOGASTRODUODENOSCOPY (EGD) WITH PROPOFOL N/A 12/15/2012   Procedure: ESOPHAGOGASTRODUODENOSCOPY (EGD) WITH PROPOFOL;  Surgeon: West Bali, MD;  Location: [facility] ORS;  Service: Endoscopy;  Laterality: N/A;  start at 0806  . ESOPHAGOGASTRODUODENOSCOPY (EGD) WITH PROPOFOL N/A 11/20/2016   Procedure: ESOPHAGOGASTRODUODENOSCOPY (EGD) WITH PROPOFOL;  Surgeon: Corbin Ade, MD;  Location: [facility] SUITE;  Service: Endoscopy;  Laterality: N/A;  . JOINT REPLACEMENT    . SPLENECTOMY    . TOTAL HIP ARTHROPLASTY  ?2013       Home Medications    Prior to Admission medications   Medication Sig Start Date End Date Taking? Authorizing Provider  acetaminophen (TYLENOL) 500 MG tablet Take 500 mg by mouth every 6 (six) hours as needed.    [provider]  ferrous sulfate 325 (65 FE) MG  tablet Take 1 tablet (325 mg total) by mouth 3 (three) times daily with meals. 11/21/16   Arrien, York Ram, MD  folic acid (FOLVITE) 1 MG tablet Take 1 tablet (1 mg total) by mouth daily. 11/22/16   Arrien, York Ram, MD  LORazepam (ATIVAN) 1 MG tablet Take 1 tablet (1 mg total) by mouth 3 (three) times daily as needed for anxiety. 11/21/16   Arrien, York Ram, MD  Multiple Vitamin (MULTIVITAMIN WITH MINERALS) TABS tablet Take 1 tablet by mouth daily. 11/22/16   Arrien, York Ram, MD  nicotine (NICODERM CQ - DOSED IN MG/24 HOURS) 21  mg/24hr patch Place 1 patch (21 mg total) onto the skin daily. 11/22/16   Arrien, York Ram, MD  pantoprazole (PROTONIX) 40 MG tablet Take 1 tablet (40 mg total) by mouth 2 (two) times daily before a meal. 11/21/16   Arrien, York Ram, MD  thiamine 100 MG tablet Take 1 tablet (100 mg total) by mouth daily. 11/22/16   Arrien, York Ram, MD    Family History Family History  Problem Relation Age of Onset  . Lung cancer Father   . Cirrhosis Brother        ETOH  . Drug abuse Sister   . Seizures Brother     Social History Social History   Tobacco Use  . Smoking status: Current Every Day Smoker    Packs/day: 1.00    Years: 30.00    Pack years: 30.00    Types: Cigarettes  . Smokeless tobacco: Never Used  Substance Use Topics  . Alcohol use: Yes    Comment: drinks typically a 40 oz  . Drug use: Yes    Types: Marijuana    Comment: marijuana, RX narcs     Allergies   Penicillins   Review of Systems Review of Systems  Level 5 caveat because patient is unresponsive/intubated.  Physical Exam Updated Vital Signs BP (!) 61/47   Pulse (!) 143   Resp (!) 24   Ht 5\' 8"  (1.727 m)   SpO2 100%   BMI 16.88 kg/m   Physical Exam  Constitutional: He appears distressed.  Cachectic and chronically ill-appearing.  HENT:  Head: Normocephalic and atraumatic.  Eyes: Conjunctivae are normal. Right eye exhibits no discharge. Left eye exhibits no discharge.  Pupils 2mm, equal, sluggigh  Neck: Neck supple.  Cardiovascular: Exam reveals no gallop and no friction rub.  No murmur heard. Tachycardic. Irregularly irregular.   Pulmonary/Chest: He is in respiratory distress.  Intubated. 7.0 ETT. Rhonchi b/l. Thick tan secretions requiring frequent suctioning.   Abdominal: Soft. He exhibits no distension. There is no tenderness.  Musculoskeletal: He exhibits no edema or tenderness.  Neurological:  Gagging. No spontaneous movement of extremities or withdrawal from pain.   Skin:    Multiple wounds at pressure areas at hips/sacrum. Pale. Extremities appear mottled.    Nursing note and vitals reviewed.    ED Treatments / Results  Labs (all labs ordered are listed, but only abnormal results are displayed) Labs Reviewed  CBC WITH DIFFERENTIAL/PLATELET - Abnormal; Notable for the following components:      Result Value   WBC 21.6 (*)    RBC 3.99 (*)    Hemoglobin 11.3 (*)    HCT 37.7 (*)    RDW 15.7 (*)    Neutro Abs 19.4 (*)    All other components within normal limits  COMPREHENSIVE METABOLIC PANEL - Abnormal; Notable for the following components:   CO2 19 (*)  Glucose, Bld 101 (*)    Calcium 6.9 (*)    Total Protein 4.6 (*)    Albumin 1.6 (*)    AST 106 (*)    All other components within normal limits  TROPONIN I - Abnormal; Notable for the following components:   Troponin I 0.06 (*)    All other components within normal limits  I-STAT ARTERIAL BLOOD GAS, ED - Abnormal; Notable for the following components:   pH, Arterial 7.170 (*)    pCO2 arterial 54.2 (*)    pO2, Arterial 427.0 (*)    Bicarbonate 19.8 (*)    TCO2 21 (*)    Acid-base deficit 9.0 (*)    All other components within normal limits  CULTURE, BLOOD (ROUTINE X 2)  CULTURE, BLOOD (ROUTINE X 2)  MAGNESIUM  URINALYSIS, ROUTINE W REFLEX MICROSCOPIC  RAPID URINE DRUG SCREEN, HOSP PERFORMED  AMMONIA  ETHANOL  I-STAT CG4 LACTIC ACID, ED  I-STAT ARTERIAL BLOOD GAS, ED    EKG  EKG Interpretation  Date/Time:  Tuesday October 21 2017 09:44:28 EST Ventricular Rate:  146 PR Interval:    QRS Duration: 82 QT Interval:  337 QTC Calculation: 522 R Axis:   94 Text Interpretation:  Atrial fibrillation Ventricular premature complex Borderline right axis deviation ST depr, consider ischemia, anterolateral lds Prolonged QT interval Confirmed by Raeford RazorKohut, Sylvanus Telford [id number]) on 11/07/2017 10:34:03 AM       Radiology Dg Chest Portable 1 View  Result Date: 10/27/2017 CLINICAL DATA:  57 year old  male with a history of intubation, cardiac arrest EXAM: PORTABLE CHEST 1 VIEW COMPARISON:  12/21/2016 FINDINGS: Cardiomediastinal silhouette unchanged. Endotracheal tube terminates 6.5 cm above the carina. Defibrillator pads project over the chest. No large pleural effusion. No confluent airspace disease or pneumothorax. Partial healing of multiple bilateral rib fractures, incompletely visualized. IMPRESSION: Endotracheal tube terminates suitably above the carina. No evidence of confluent airspace disease, pneumothorax, or large pleural effusion. Defibrillator pads on the chest wall. Multiple partially healed/ remodeled bilateral rib fractures. Electronically Signed   By: Gilmer MorJaime  Wagner D.O.   On: 07-Mar-2018 10:40    Procedures ARTERIAL BLOOD GAS Date/Time: 11/13/2017 11:24 AM Performed by: Raeford RazorKohut, Zebulon Gantt, MD Authorized by: Raeford RazorKohut, Elinor Kleine, MD   Consent:    Consent obtained:  Emergent situation Anesthesia (see MAR for exact dosages):    Anesthesia method:  None Procedure details:    Location:  L femoral Post-procedure details:    Dressing applied: no     Bleeding:  Hemostasis achieved   Circulation, movement, and sensation:  Unchanged   Patient tolerance of procedure:  Tolerated well, no immediate complications   CENTRAL LINE Performed by: Raeford RazorStephen Tessi Eustache Consent: The procedure was performed in an emergent situation. Required items: required blood products, implants, devices, and special equipment available Patient identity confirmed: arm band and provided demographic data Time out: Immediately prior to procedure a "time out" was called to verify the correct patient, procedure, equipment, support staff and site/side marked as required. Indications: vascular access Anesthesia: local infiltration Local anesthetic: lidocaine 1% with epinephrine Anesthetic total: 3 ml Patient sedated: no Preparation: skin prepped with 2% chlorhexidine Skin prep agent dried: skin prep agent completely dried  prior to procedure Sterile barriers used.  Location details: R femoral  Catheter type: triple lumen Catheter size: 8 Fr Pre-procedure: landmarks identified Ultrasound guidance: yes Successful placement: yes Post-procedure: line sutured and dressing applied Assessment: blood return through all ports, free fluid flow. Patient tolerance: Patient tolerated the procedure well with no immediate complications.  CRITICAL CARE Performed by: Raeford Razor Total critical care time: 40 minutes Critical care time was exclusive of separately billable procedures and treating other patients. Critical care was necessary to treat or prevent imminent or life-threatening deterioration. Critical care was time spent personally by me on the following activities: development of treatment plan with patient and/or surrogate as well as nursing, discussions with consultants, evaluation of patient's response to treatment, examination of patient, obtaining history from patient or surrogate, ordering and performing treatments and interventions, ordering and review of laboratory studies, ordering and review of radiographic studies, pulse oximetry and re-evaluation of patient's condition.  Medications Ordered in ED Medications  norepinephrine (LEVOPHED) 4 mg in dextrose 5 % 250 mL (0.016 mg/mL) infusion (not administered)  vancomycin (VANCOCIN) IVPB 1000 mg/200 mL premix (1,000 mg Intravenous New Bag/Given 11-17-2017 1042)  piperacillin-tazobactam (ZOSYN) IVPB 3.375 g (not administered)  fentaNYL (SUBLIMAZE) 100 MCG/2ML injection (not administered)  propofol (DIPRIVAN) 1000 MG/100ML infusion (not administered)  fentaNYL (SUBLIMAZE) injection 50 mcg (not administered)  propofol (DIPRIVAN) 1000 MG/100ML infusion (not administered)  sodium chloride 0.9 % bolus 1,000 mL (not administered)  sodium chloride 0.9 % bolus 1,000 mL (1,000 mLs Intravenous New Bag/Given 11-17-2017 1000)  norepinephrine (LEVOPHED) 4 mg in dextrose 5 %  250 mL (0.016 mg/mL) infusion (14 mcg/min Intravenous New Bag/Given 11-17-17 1001)     Initial Impression / Assessment and Plan / ED Course  I have reviewed the triage vital signs and the nursing notes.  Pertinent labs & imaging results that were available during my care of the patient were reviewed by me and considered in my medical decision making (see chart for details).  Clinical Course as of Oct 21 1113  Tue 11-17-17  1109 Spoke with pt's girlfriend whom he has lived with for the past 7 years. He has actually been having breathing difficulties for the past several days. She has been trying to get him to come to the hospital but he has refused. She checked on him this morning and he did not respond but she did see him breathing. 15 minutes later he was still unresponsive but now with no respiratory effort. She did not start CPR prior to EMS arrival. She states he has told her several times previously that if he ever got this sick that he didn't want to be on a ventilator. His mother is living in [place], [place] and she'll try to get her phone number.    [SK]    Clinical Course User Index [SK] Raeford Razor, MD    57 year old male with unwitnessed arrest.  Initial rhythm asystole.  A. fib with RVR on arrival to the emergency room. Diffuse ischemic changes. Not sure if primary cardiac event.   Patient has copious thick yellow secretions requiring frequent suctioning. Abx empirically started given the acuity of his condition. No reported trauma. Neurologically not doing much. Will CT head. Hypontensive. IVF boluses. Central line placed and levophed started.   Final Clinical Impressions(s) / ED Diagnoses   Final diagnoses:  Cardiac arrest [facility])    ED Discharge Orders    None       Raeford Razor, MD 2017-11-17 1153

## 2017-10-21 NOTE — ED Triage Notes (Signed)
Pt here from home post arrest , pt found by wife , initial rhythm asystole , pt received 1 epi and 2mg  of narcan , pt went into pea and then ST

## 2017-10-21 NOTE — ED Notes (Signed)
Report given to ICU

## 2017-10-21 NOTE — Progress Notes (Signed)
ANTICOAGULATION CONSULT NOTE - Initial Consult  Pharmacy Consult for Heparin Indication: pulmonary embolus  Allergies  Allergen Reactions  . Penicillins Rash    Has patient had a PCN reaction causing immediate rash, facial/tongue/throat swelling, SOB or lightheadedness with hypotension: Yes Has patient had a PCN reaction causing severe rash involving mucus membranes or skin necrosis: No Has patient had a PCN reaction that required hospitalization No  Tolerated zosyn for 3 days without problems 1/23 Has patient had a PCN reaction occurring within the last 10 years: No If all of the above answers are "NO", then may proceed with Cephalosporin use. 1    Patient Measurements: Height: 5\' 8"  (172.7 cm) Weight: 110 lb 0.2 oz (49.9 kg) IBW/kg (Calculated) : 68.4 Heparin Dosing Weight: 49.9 kg  Vital Signs: Temp: 97.7 F (36.5 C) (01/01 1830) Temp Source: Core (01/01 1830) BP: 143/100 (01/01 1830) Pulse Rate: 91 (01/01 1830)  Labs: Recent Labs    2018/06/25 1000 2018/06/25 1509 2018/06/25 1740 2018/06/25 1743  HGB 11.3* 12.3*  --   --   HCT 37.7* 39.9  --   --   PLT 296 329  --   --   APTT  --   --  33  --   LABPROT  --   --  16.1*  --   INR  --   --  1.30  --   CREATININE 1.10 1.16 1.35*  --   TROPONINI 0.06* 0.24*  --  0.41*    Estimated Creatinine Clearance: 42.6 mL/min (A) (by C-G formula based on SCr of 1.35 mg/dL (H)).   Medical History: Past Medical History:  Diagnosis Date  . Alcoholic encephalopathy (HCC) 1/61/09601/14/2014  . Anemia   . Anemia associated with acute blood loss 11/01/2012  . Asthma   . Bleeding duodenal ulcer 11/01/2012  . Elevated LFTs   . ETOH abuse   . Gastric ulcer 11/01/2012  . Gastritis and duodenitis   . Helicobacter pylori antibody positive 11/03/2012   But biopsy negative for H. pylori.  Marland Kitchen. Hypertension   . Pneumonia 2010  . Polysubstance abuse (HCC)    Rx narcotics, marijuana, etoh  . Rectal bleeding   . Shortness of breath    Assessment: [age]  yoM presents with acute PE. Pharmacy consulted to dose heparin. Hgb 12.3, pltc WNL. No bleeding noted.   Goal of Therapy:  Heparin level 0.3-0.7 units/ml Monitor platelets by anticoagulation protocol: Yes   Plan:  Give 3000 units bolus x 1 Start heparin infusion at 850 units/hr Check heparin level in 6 hours Daily heparin level, CBC Monitor for s/sx of bleeding  Raymond Fox, PharmD PGY1 Pharmacy Resident Pager: [phone] 11/06/2017,7:18 PM

## 2017-10-21 NOTE — ED Notes (Signed)
Bare hugger off

## 2017-10-22 ENCOUNTER — Inpatient Hospital Stay (HOSPITAL_COMMUNITY): Payer: Medicare HMO

## 2017-10-22 DIAGNOSIS — E43 Unspecified severe protein-calorie malnutrition: Secondary | ICD-10-CM

## 2017-10-22 DIAGNOSIS — L89200 Pressure ulcer of unspecified hip, unstageable: Secondary | ICD-10-CM

## 2017-10-22 DIAGNOSIS — R7881 Bacteremia: Secondary | ICD-10-CM

## 2017-10-22 DIAGNOSIS — I469 Cardiac arrest, cause unspecified: Secondary | ICD-10-CM

## 2017-10-22 DIAGNOSIS — J9601 Acute respiratory failure with hypoxia: Secondary | ICD-10-CM

## 2017-10-22 DIAGNOSIS — R569 Unspecified convulsions: Secondary | ICD-10-CM

## 2017-10-22 DIAGNOSIS — I2699 Other pulmonary embolism without acute cor pulmonale: Secondary | ICD-10-CM

## 2017-10-22 LAB — BLOOD CULTURE ID PANEL (REFLEXED)
Acinetobacter baumannii: NOT DETECTED
Candida albicans: NOT DETECTED
Candida glabrata: NOT DETECTED
Candida krusei: NOT DETECTED
Candida parapsilosis: NOT DETECTED
Candida tropicalis: NOT DETECTED
Carbapenem resistance: NOT DETECTED
Enterobacter cloacae complex: NOT DETECTED
Enterococcus species: NOT DETECTED
Escherichia coli: NOT DETECTED
Haemophilus influenzae: NOT DETECTED
Klebsiella oxytoca: NOT DETECTED
Klebsiella pneumoniae: NOT DETECTED
Listeria monocytogenes: NOT DETECTED
Methicillin resistance: DETECTED — AB
Neisseria meningitidis: NOT DETECTED
Pseudomonas aeruginosa: NOT DETECTED
Serratia marcescens: NOT DETECTED
Staphylococcus aureus (BCID): DETECTED — AB
Staphylococcus species: DETECTED — AB
Streptococcus agalactiae: NOT DETECTED
Streptococcus pneumoniae: NOT DETECTED
Streptococcus pyogenes: NOT DETECTED
Streptococcus species: NOT DETECTED

## 2017-10-22 LAB — BASIC METABOLIC PANEL
Anion gap: 7 (ref 5–15)
BUN: 24 mg/dL — AB (ref 6–20)
CHLORIDE: 108 mmol/L (ref 101–111)
CO2: 19 mmol/L — AB (ref 22–32)
CREATININE: 1.38 mg/dL — AB (ref 0.61–1.24)
Calcium: 7.3 mg/dL — ABNORMAL LOW (ref 8.9–10.3)
GFR calc Af Amer: >60 mL/min (ref >60)
GFR calc non Af Amer: 55 mL/min — ABNORMAL LOW (ref >60)
GLUCOSE: 100 mg/dL — AB (ref 65–99)
POTASSIUM: 3.5 mmol/L (ref 3.5–5.1)
Sodium: 134 mmol/L — ABNORMAL LOW (ref 135–145)

## 2017-10-22 LAB — GLUCOSE, CAPILLARY
GLUCOSE-CAPILLARY: 93 mg/dL (ref 65–99)
GLUCOSE-CAPILLARY: 81 mg/dL (ref 65–99)
GLUCOSE-CAPILLARY: 76 mg/dL (ref 65–99)
GLUCOSE-CAPILLARY: 79 mg/dL (ref 65–99)
Glucose-Capillary: 78 mg/dL (ref 65–99)

## 2017-10-22 LAB — BLOOD GAS, ARTERIAL
ACID-BASE DEFICIT: 3.7 mmol/L — AB (ref 0.0–2.0)
BICARBONATE: 20.5 mmol/L (ref 20.0–28.0)
FIO2: 40
LHR: 24 {breaths}/min
MECHVT: 550 mL
O2 SAT: 99.1 %
PATIENT TEMPERATURE: 96.8
PCO2 ART: 33.5 mmHg (ref 32.0–48.0)
PEEP/CPAP: 5 cmH2O
PH ART: 7.398 (ref 7.350–7.450)
PO2 ART: 176 mmHg — AB (ref 83.0–108.0)

## 2017-10-22 LAB — CBC
HEMATOCRIT: 37.4 % — AB (ref 39.0–52.0)
HEMOGLOBIN: 11.8 g/dL — AB (ref 13.0–17.0)
MCH: 28.4 pg (ref 26.0–34.0)
MCHC: 31.6 g/dL (ref 30.0–36.0)
MCV: 89.9 fL (ref 78.0–100.0)
Platelets: 340 10*3/uL (ref 150–400)
RBC: 4.16 MIL/uL — AB (ref 4.22–5.81)
RDW: 15.8 % — ABNORMAL HIGH (ref 11.5–15.5)
WBC: 26.5 10*3/uL — ABNORMAL HIGH (ref 4.0–10.5)

## 2017-10-22 LAB — HEPARIN LEVEL (UNFRACTIONATED)
HEPARIN UNFRACTIONATED: 0.19 [IU]/mL — AB (ref 0.30–0.70)
HEPARIN UNFRACTIONATED: 0.41 [IU]/mL (ref 0.30–0.70)
Heparin Unfractionated: 0.43 IU/mL (ref 0.30–0.70)

## 2017-10-22 LAB — PROTIME-INR
INR: 1.24
PROTHROMBIN TIME: 15.5 s — AB (ref 11.4–15.2)

## 2017-10-22 LAB — TROPONIN I: Troponin I: 0.44 ng/mL (ref <0.03)

## 2017-10-22 LAB — APTT: aPTT: 92 seconds — ABNORMAL HIGH (ref 24–36)

## 2017-10-22 LAB — ECHOCARDIOGRAM COMPLETE
Height: 68 in
WEIGHTICAEL: 1771.79 [oz_av]

## 2017-10-22 LAB — MAGNESIUM: Magnesium: 1.5 mg/dL — ABNORMAL LOW (ref 1.7–2.4)

## 2017-10-22 LAB — PHOSPHORUS: Phosphorus: 3.4 mg/dL (ref 2.5–4.6)

## 2017-10-22 MED ORDER — DEXTROSE 5 % IV SOLN
2.0000 g | INTRAVENOUS | Status: DC
  Administered 2017-10-22 – 2017-10-23 (×2): 2 g via INTRAVENOUS
  Filled 2017-10-22 (×2): qty 2

## 2017-10-22 MED ORDER — MAGNESIUM SULFATE 2 GM/50ML IV SOLN
2.0000 g | Freq: Once | INTRAVENOUS | Status: AC
  Administered 2017-10-22: 2 g via INTRAVENOUS
  Filled 2017-10-22: qty 50

## 2017-10-22 MED ORDER — HEPARIN BOLUS VIA INFUSION
1500.0000 [IU] | Freq: Once | INTRAVENOUS | Status: AC
  Administered 2017-10-22: 1500 [IU] via INTRAVENOUS
  Filled 2017-10-22: qty 1500

## 2017-10-22 MED ORDER — POTASSIUM CHLORIDE 20 MEQ/15ML (10%) PO SOLN
40.0000 meq | Freq: Once | ORAL | Status: AC
  Administered 2017-10-22: 40 meq via ORAL
  Filled 2017-10-22: qty 30

## 2017-10-22 MED ORDER — VANCOMYCIN HCL 500 MG IV SOLR
500.0000 mg | Freq: Two times a day (BID) | INTRAVENOUS | Status: DC
  Administered 2017-10-22 – 2017-10-23 (×3): 500 mg via INTRAVENOUS
  Filled 2017-10-22 (×3): qty 500

## 2017-10-22 MED ORDER — SODIUM CHLORIDE 0.9 % IV SOLN
2.0000 g | Freq: Once | INTRAVENOUS | Status: AC
  Administered 2017-10-22: 2 g via INTRAVENOUS
  Filled 2017-10-22: qty 20

## 2017-10-22 MED ORDER — METRONIDAZOLE 50 MG/ML ORAL SUSPENSION
500.0000 mg | Freq: Three times a day (TID) | ORAL | Status: DC
  Administered 2017-10-22 – 2017-10-23 (×4): 500 mg
  Filled 2017-10-22 (×8): qty 10

## 2017-10-22 NOTE — Progress Notes (Signed)
Placed patient on contact precautions after blood cultures resulted positive for enterobacteriaceae, MRSA, and staph.

## 2017-10-22 NOTE — Progress Notes (Signed)
ANTICOAGULATION CONSULT NOTE - Follow Up Consult  Pharmacy Consult for heparin Indication: pulmonary embolus  Labs: Recent Labs    10/25/2017 1000 11/01/2017 1509 11/08/2017 1740 11/01/2017 1743 10/22/17 0156  HGB 11.3* 12.3*  --   --   --   HCT 37.7* 39.9  --   --   --   PLT 296 329  --   --   --   APTT  --   --  33  --   --   LABPROT  --   --  16.1*  --   --   INR  --   --  1.30  --   --   HEPARINUNFRC  --   --   --   --  0.43  CREATININE 1.10 1.16 1.35*  --   --   TROPONINI 0.06* 0.24*  --  0.41*  --     Assessment/Plan:  57yo male therapeutic on heparin with initial dosing for PE. Will continue gtt at current rate and confirm stable with additional level.   Vernard GamblesVeronda Derra Shartzer, PharmD, BCPS  10/22/2017,2:40 AM

## 2017-10-22 NOTE — Progress Notes (Signed)
ANTICOAGULATION CONSULT NOTE - Initial Consult  Pharmacy Consult for Heparin Indication: pulmonary embolus  Allergies  Allergen Reactions  . Penicillins Rash    Tolerated zosyn for 3 days without problems 1/23 Has patient had a PCN reaction causing immediate rash, facial/tongue/throat swelling, SOB or lightheadedness with hypotension: Yes Has patient had a PCN reaction causing severe rash involving mucus membranes or skin necrosis: No Has patient had a PCN reaction that required hospitalization No Has patient had a PCN reaction occurring within the last 10 years: No If all of the above answers are "NO", then may proceed with Cephalosporin use. 1    Patient Measurements: Height: 5\' 8"  (172.7 cm) Weight: 110 lb 11.8 oz (50.2 kg) IBW/kg (Calculated) : 68.4 Heparin Dosing Weight: 49.9 kg  Vital Signs: Temp: 96.8 F (36 C) (01/02 1000) Temp Source: Core (01/02 1000) BP: 117/79 (01/02 1123) Pulse Rate: 83 (01/02 1123)  Labs: Recent Labs    Nov 06, 2017 1000 Nov 06, 2017 1509 Nov 06, 2017 1740 Nov 06, 2017 1743 10/22/17 0156 10/22/17 0404 10/22/17 0411 10/22/17 0920  HGB 11.3* 12.3*  --   --   --   --  11.8*  --   HCT 37.7* 39.9  --   --   --   --  37.4*  --   PLT 296 329  --   --   --   --  340  --   APTT  --   --  33  --  92*  --   --   --   LABPROT  --   --  16.1*  --  15.5*  --   --   --   INR  --   --  1.30  --  1.24  --   --   --   HEPARINUNFRC  --   --   --   --  0.43  --   --  0.19*  CREATININE 1.10 1.16 1.35*  --   --  1.38*  --   --   TROPONINI 0.06* 0.24*  --  0.41* 0.44*  --   --   --     Estimated Creatinine Clearance: 41.9 mL/min (A) (by C-G formula based on SCr of 1.38 mg/dL (H)).   Medical History: Past Medical History:  Diagnosis Date  . Alcoholic encephalopathy (HCC) 0/98/11911/14/2014  . Anemia   . Anemia associated with acute blood loss 11/01/2012  . Asthma   . Bleeding duodenal ulcer 11/01/2012  . Elevated LFTs   . ETOH abuse   . Gastric ulcer 11/01/2012  .  Gastritis and duodenitis   . Helicobacter pylori antibody positive 11/03/2012   But biopsy negative for H. pylori.  Marland Kitchen. Hypertension   . Pneumonia 2010  . Polysubstance abuse (HCC)    Rx narcotics, marijuana, etoh  . Rectal bleeding   . Shortness of breath    Assessment: [age] yoM presents with acute PE. Pharmacy consulted to dose heparin.  -heparin level = 0.19 on 850 units/hr    Goal of Therapy:  Heparin level 0.3-0.7 units/ml Monitor platelets by anticoagulation protocol: Yes   Plan:  -heparin bolus 1500 units x1 and increase infusion to 1000 units/hr -Heparin level in 8 hours and daily wth CBC daily  Harland Germanndrew Tiler Brandis, Pharm D 10/22/2017 11:49 AM

## 2017-10-22 NOTE — Progress Notes (Addendum)
Ring from left hand ring finger removed from patient. Ring placed in specimen cup with patient label, left at bedside. Will pass on to AM RN in order to give to family.

## 2017-10-22 NOTE — Progress Notes (Signed)
  Echocardiogram 2D Echocardiogram has been performed.  Celene SkeenVijay  Hasani Diemer 10/22/2017, 1:55 PM

## 2017-10-22 NOTE — Procedures (Signed)
ELECTROENCEPHALOGRAM REPORT  Date of Study: 10/22/2017  Patient's Name: Raymond LainWilliam R Wickes MRN: [id number] Date of Birth: 1960/11/28  Referring Provider: Lynnell JudeWalter J Gray, MD  Clinical History: 57 year old man status post cardiac arrest.  Medications: Propofol Levophed Rocephin Flagyl Protonix  Technical Summary: A multichannel digital EEG recording measured by the international 10-20 system with electrodes applied with paste and impedances below 5000 ohms performed in our laboratory with EKG monitoring in an intubated and sedated patient.  Hyperventilation and photic stimulation were not performed.  The digital EEG was referentially recorded, reformatted, and digitally filtered in a variety of bipolar and referential montages for optimal display.    Description: The patient is intubated and unresponsive, under propofol sedation during the recording. There is loss of normal background activity.  There is 1 second intermittent bursts of bilateral periodic sharp waves every 1 to 3 seconds.  There is no spontaneous reactivity or reactivity noted with noxious stimulation.  Hyperventilation and photic stimulation were not performed. There were no epileptiform discharges or electrographic seizures seen.   EKG lead was unremarkable.  Impression: This EEG is markedly abnormal due to burst-suppression pattern with intermittent bilateral periodic sharp waves.   Clinical Correlation: This record shows evidence of severe diffuse or bilateral cerebral dysfunction which more likely reflects the patient's history of anoxic brain injury as opposed to ictal epileptiform activity. In the absence of CNS active, sedating, or anesthetic medications, this suggests a poor prognosis. If further clinical questions remain, repeat EEG off sedation may be helpful. Clinical correlation is advised  Shon MilletAdam Jaffe, DO

## 2017-10-22 NOTE — Progress Notes (Signed)
ANTICOAGULATION CONSULT NOTE - FOLLOW UP    HL = 0.41 (goal 0.3 - 0.7 units/mL) Heparin dosing weight = 50 kg   Assessment: 56 YOM with acute PE to continue on IV heparin.  Heparin level is therapeutic; no bleeding reported.   Plan: Continue heparin gtt at 1000 units/hr F/U AM labs   Shaquille Murdy D. Laney Potashang, PharmD, BCPS 10/22/2017, 10:33 PM

## 2017-10-22 NOTE — Progress Notes (Signed)
EEG complete - results pending 

## 2017-10-22 NOTE — Progress Notes (Signed)
PHARMACY - PHYSICIAN COMMUNICATION CRITICAL VALUE ALERT - BLOOD CULTURE IDENTIFICATION (BCID)  Raymond Fox is an 57 y.o. male who [facility] with a chief complaint of cardiac arrest  Assessment: s/p cardiac arrest, CCM noted hip (fluctuant) as possible source  Name of physician (or Provider) ContactedPola Corn: ELINK MD  Current antibiotics: None   Changes to prescribed antibiotics recommended: Add Vancomycin 500 mg IV q12h    Results for orders placed or performed during the hospital encounter of 11/09/2017  Blood Culture ID Panel (Reflexed) (Collected: 11/01/2017 10:00 AM)  Result Value Ref Range   Enterococcus species NOT DETECTED NOT DETECTED   Listeria monocytogenes NOT DETECTED NOT DETECTED   Staphylococcus species DETECTED (A) NOT DETECTED   Staphylococcus aureus DETECTED (A) NOT DETECTED   Methicillin resistance DETECTED (A) NOT DETECTED   Streptococcus species NOT DETECTED NOT DETECTED   Streptococcus agalactiae NOT DETECTED NOT DETECTED   Streptococcus pneumoniae NOT DETECTED NOT DETECTED   Streptococcus pyogenes NOT DETECTED NOT DETECTED   Acinetobacter baumannii NOT DETECTED NOT DETECTED   Enterobacteriaceae species DETECTED (A) NOT DETECTED   Enterobacter cloacae complex NOT DETECTED NOT DETECTED   Escherichia coli NOT DETECTED NOT DETECTED   Klebsiella oxytoca NOT DETECTED NOT DETECTED   Klebsiella pneumoniae NOT DETECTED NOT DETECTED   Serratia marcescens NOT DETECTED NOT DETECTED   Carbapenem resistance NOT DETECTED NOT DETECTED   Haemophilus influenzae NOT DETECTED NOT DETECTED   Neisseria meningitidis NOT DETECTED NOT DETECTED   Pseudomonas aeruginosa NOT DETECTED NOT DETECTED   Candida albicans NOT DETECTED NOT DETECTED   Candida glabrata NOT DETECTED NOT DETECTED   Candida krusei NOT DETECTED NOT DETECTED   Candida parapsilosis NOT DETECTED NOT DETECTED   Candida tropicalis NOT DETECTED NOT DETECTED    Abran DukeLedford, Frankye Schwegel 10/22/2017  7:08  AM

## 2017-10-22 NOTE — Progress Notes (Signed)
Initial Nutrition Assessment  DOCUMENTATION CODES:   Severe malnutrition in context of chronic illness, Underweight  INTERVENTION:    Tube Feeding Recommendations:  Vital AF 1.2 @ 45 ml/hr Pro-Stat 30 mL daily As pt at risk for refeeding syndrome, recommend starting TF at rate of 20 ml/hr with titration by 10 mL q 8 hours until goal rate met. At goal, TF provides 96 g of protein, 1396 kcals, 875 mL of free water TF regimen and propofol at current rate providing 1752  total kcal/day (99 % of kcal needs). Meets 100% protein needs.     If TF initiated, monitor magnesium, potassium, and phosphorus daily for at least 3 days, MD to replete as needed, as pt is at very high risk for refeeding syndrome given pt meets clinical characteristics for severe malnutrition on admission, underweight, hx of chronic alcohol abuse  NUTRITION DIAGNOSIS:   Severe Malnutrition related to chronic illness as evidenced by severe muscle depletion, severe fat depletion(chronic alcoholic encephalopathy, EtOH abuse).  GOAL:   Provide needs based on ASPEN/SCCM guidelines  MONITOR:   Vent status, Labs, Weight trends  REASON FOR ASSESSMENT:   Ventilator    ASSESSMENT:   57 yo male admitted post PEA arrest, in shock, acute respiratory failure with PE, presumed COPD, RML lung nodules.  Pt with hx of chronic alcoholism with chronic encephalopathy. Pt has lost interest in self-care over the past 2 weeks and has not left the bed. Noted pt with multiple foul-smelling stage III, unstageable pressure ulcers   TTM, normothermia  Patient is currently intubated on ventilator support MV: 13.2 L/min Temp (24hrs), Avg:97.1 F (36.2 C), Min:94.8 F (34.9 C), Max:100.2 F (37.9 C)  Propofol: 13.5 ml/hr  Pt unable to provide diet and weight history; no family or friends at bedside.   Labs: sodium 134, Creatinine 1.38, BUN 24, magnesium 1.5 Meds: NS at 50 ml/hr  NUTRITION - FOCUSED PHYSICAL EXAM:    Most  Recent Value  Orbital Region  Severe depletion  Upper Arm Region  Severe depletion  Thoracic and Lumbar Region  Unable to assess  Buccal Region  Severe depletion  Temple Region  Severe depletion  Clavicle Bone Region  Severe depletion  Clavicle and Acromion Bone Region  Severe depletion  Scapular Bone Region  Severe depletion  Dorsal Hand  Moderate depletion  Patellar Region  Unable to assess  Anterior Thigh Region  Unable to assess  Posterior Calf Region  Moderate depletion  Edema (RD Assessment)  None  Hair  Reviewed  Eyes  Unable to assess  Mouth  Unable to assess  Skin  Reviewed  Nails  Unable to assess       Diet Order:  Diet NPO time specified  EDUCATION NEEDS:   Not appropriate for education at this time  Skin:  Skin Assessment: Skin Integrity Issues: Skin Integrity Issues:: Unstageable, Stage III Stage III: hip, buttock Unstageable: coccyx, hip, buttock  Last BM:  no documented BM  Height:   Ht Readings from Last 1 Encounters:  10/30/2017 '5\' 8"'$  (1.727 m)    Weight:   Wt Readings from Last 1 Encounters:  10/22/17 110 lb 11.8 oz (50.2 kg)    Ideal Body Weight:  70 kg  BMI:  Body mass index is 16.84 kg/m.  Estimated Nutritional Needs:   Kcal:  1780 kcals  Protein:  90-115 g  Fluid:  >/= 1.7 L   Kerman Passey MS, RD, LDN, CNSC [phone] Pager  [phone] Weekend/On-Call Pager

## 2017-10-22 NOTE — Consult Note (Signed)
[facility] for Infectious Disease    Date of Admission:  11/03/2017     Total days of antibiotics 1  Vancomycin Day 1  Zosyn 1/1 > 1/1            Reason for Consult: MRSA Bacteremia     Referring Provider: CHAMP auto consult    Assessment: 57 yo asplenic male admitted to hospital after cardiac arrest at home. Sepsis criteria met with hypothermia, hypotension and leukocytosis. Found to have acute pulmonary embolus, MRSA bacteremia, possible gram negative bacteremia (BCID picked up enterobacteriaceae species however no growth so far) and chronic/severe pressure wounds. CT does not mention osteomyelitis of pelvis; he is s/p right hip arthroplasty and decompression of L5-S1 with hardware in place.   Plan: 1. Continue Vancomycin for MRSA bacteremia.  2. Contact precautions initiated.  3. TTE is pending to assess for IE in setting of SAB. Would defer TEE until more discussion regarding what family's wishes are and goals of care.  4. Would add Ceftriaxone + Metronidazole for gram neg and anaerobic coverage considering BCID and appearance of his wounds. Follow cultures.  5. Further work up pending goals of care conversations as we learn more about prognosis regarding neurologic recovery.  6. May need to add rifampin after a few days considering he has hardware in spine and hip in setting of SAB.    HPI: History was obtained via chart review as the patient is intubated/unresponsive and no family is present at the bedside.  Raymond Fox is a 57 y.o. Male admitted on 11/12/2017 after EMS responded to call at his house where he was found apenic and unresponsive. His girlfriend started CPR until EMS could arrive. He was found to be asystolic and ACLS protocol was applied until arrival to hospital. Was found to have atrial fibrillation after resuscitation.   Hospital Course: Found to be acidotic, hypothermic and with leukocytosis in ED. Started on Vancomycin and Zosyn after blood cultures  obtained. Of not he does have a PCN allergy. CT of head, chest and abdomen were obtained and revealed acute pulmonary embolus. Currently he is intubated and sedated on levophed and on normothermic protocol.  Active Problems:   Cardiac arrest ([place])   MRSA bacteremia   Gram-negative bacteremia   Pressure injury of hip, unstageable (H[facility])   Pulmonary embolus ([facility])   Hypotensive episode   . chlorhexidine gluconate (MEDLINE KIT)  15 mL Mouth Rinse BID  . Chlorhexidine Gluconate Cloth  6 each Topical Daily  . mouth rinse  15 mL Mouth Rinse 10 times per day  . metroNIDAZOLE  500 mg Per Tube TID  . pantoprazole (PROTONIX) IV  40 mg Intravenous Q12H  . sodium chloride flush  10-40 mL Intracatheter Q12H   Review of Systems: Review of Systems  Unable to perform ROS: Medical condition    Past Medical History:  Diagnosis Date  . Alcoholic encephalopathy ([place]) 11/03/2012  . Anemia   . Anemia associated with acute blood loss 11/01/2012  . Asthma   . Bleeding duodenal ulcer 11/01/2012  . Elevated LFTs   . ETOH abuse   . Gastric ulcer 11/01/2012  . Gastritis and duodenitis   . Helicobacter pylori antibody positive 11/03/2012   But biopsy negative for H. pylori.  [place] Kitchen Hypertension   . Pneumonia 2010  . Polysubstance abuse ([place])    Rx narcotics, marijuana, etoh  . Rectal bleeding   . Shortness of breath     Social History  Tobacco Use  . Smoking status: Current Every Day Smoker    Packs/day: 1.00    Years: 30.00    Pack years: 30.00    Types: Cigarettes  . Smokeless tobacco: Never Used  Substance Use Topics  . Alcohol use: Yes    Comment: drinks typically a 40 oz  . Drug use: Yes    Types: Marijuana    Comment: marijuana, RX narcs    Family History  Problem Relation Age of Onset  . Lung cancer Father   . Cirrhosis Brother        ETOH  . Drug abuse Sister   . Seizures Brother    Allergies  Allergen Reactions  . Penicillins Rash    Tolerated zosyn for 3 days without problems  1/23 Has patient had a PCN reaction causing immediate rash, facial/tongue/throat swelling, SOB or lightheadedness with hypotension: Yes Has patient had a PCN reaction causing severe rash involving mucus membranes or skin necrosis: No Has patient had a PCN reaction that required hospitalization No Has patient had a PCN reaction occurring within the last 10 years: No If all of the above answers are "NO", then may proceed with Cephalosporin use. 1    OBJECTIVE: Blood pressure 101/90, pulse 79, temperature (!) 96.8 F (36 C), temperature source Core, resp. rate (!) 24, height '5\' 8"'$  (1.727 m), weight 110 lb 11.8 oz (50.2 kg), SpO2 100 %.  Physical Exam  Constitutional: Vital signs are normal. He appears malnourished. He appears unhealthy.  HENT:  ETT present. No oral lesions noted. Poor dentition.   Eyes: No scleral icterus. Right eye exhibits no nystagmus. Left eye exhibits no nystagmus. Pupils are unequal.  Upward deviated gaze. Pupils R 3 mm, L 1-2 mm.   Cardiovascular: Normal rate and regular rhythm.  No murmur heard. ST elevation noted on bedside monitor. Difficult to hear with defib and cooling pads.   Pulmonary/Chest: Effort normal and breath sounds normal.  Abdominal: Soft. Bowel sounds are normal. He exhibits no distension.  Musculoskeletal:  Involuntary twitching noted to upper extremities/chest/neck  Lymphadenopathy:    He has no cervical adenopathy.  Neurological:  Sedated on ventilator. Not responsive, not following commands. Involuntary twitching noted with and without stimuli.   Skin: Skin is warm and dry.  1. Non-blanching wound to right knee.  2. Un-stageable large annular wound to left lateral hip with eschar throughout entire wound bed. Strong odor. Erythematous, edematous margins. 3. Un-stageable annular wound to anterior right hip approx 1.5 in in diameter.  4. Sacral wound not visualized d/t EEG in process    Left Hip:    Lab Results Lab Results  Component  Value Date   WBC 26.5 (H) 10/22/2017   HGB 11.8 (L) 10/22/2017   HCT 37.4 (L) 10/22/2017   MCV 89.9 10/22/2017   PLT 340 10/22/2017    Lab Results  Component Value Date   CREATININE 1.38 (H) 10/22/2017   BUN 24 (H) 10/22/2017   NA 134 (L) 10/22/2017   K 3.5 10/22/2017   CL 108 10/22/2017   CO2 19 (L) 10/22/2017    Lab Results  Component Value Date   ALT 41 11/17/2017   AST 106 (H) 11/06/2017   ALKPHOS 59 10/25/2017   BILITOT 0.8 11/16/2017     Microbiology: Recent Results (from the past 240 hour(s))  Blood culture (routine x 2)     Status: None (Preliminary result)   Collection Time: 11/12/2017 10:00 AM  Result Value Ref Range Status   Specimen  Description BLOOD RIGHT FEMORAL ARTERY  Final   Special Requests   Final    IN PEDIATRIC BOTTLE Blood Culture results may not be optimal due to an excessive volume of blood received in culture bottles   Culture  Setup Time   Final    GRAM POSITIVE COCCI IN CLUSTERS IN PEDIATRIC BOTTLE CRITICAL RESULT CALLED TO, READ BACK BY AND VERIFIED WITH: J.LEDFORD PHARMD, AT 3419 10/22/17 BY D. VANHOOK    Culture GRAM POSITIVE COCCI  Final   Report Status PENDING  Incomplete  Blood Culture ID Panel (Reflexed)     Status: Abnormal   Collection Time: 10/28/2017 10:00 AM  Result Value Ref Range Status   Enterococcus species NOT DETECTED NOT DETECTED Final   Listeria monocytogenes NOT DETECTED NOT DETECTED Final   Staphylococcus species DETECTED (A) NOT DETECTED Final    Comment: CRITICAL RESULT CALLED TO, READ BACK BY AND VERIFIED WITH: J.LEDFORD PHARMD, AT 6222 10/22/17 BY D. VANHOOK    Staphylococcus aureus DETECTED (A) NOT DETECTED Final    Comment: Methicillin (oxacillin)-resistant Staphylococcus aureus (MRSA). MRSA is predictably resistant to beta-lactam antibiotics (except ceftaroline). Preferred therapy is vancomycin unless clinically contraindicated. Patient requires contact precautions if  hospitalized. CRITICAL RESULT CALLED TO, READ BACK  BY AND VERIFIED WITH: J.LEDFORD PHARMD, AT 9798 10/22/17 BY D. VANHOOK    Methicillin resistance DETECTED (A) NOT DETECTED Final    Comment: CRITICAL RESULT CALLED TO, READ BACK BY AND VERIFIED WITH: J.LEDFORD PHARMD, AT 9211 10/22/17 BY D. VANHOOK    Streptococcus species NOT DETECTED NOT DETECTED Final   Streptococcus agalactiae NOT DETECTED NOT DETECTED Final   Streptococcus pneumoniae NOT DETECTED NOT DETECTED Final   Streptococcus pyogenes NOT DETECTED NOT DETECTED Final   Acinetobacter baumannii NOT DETECTED NOT DETECTED Final   Enterobacter cloacae complex NOT DETECTED NOT DETECTED Final   Escherichia coli NOT DETECTED NOT DETECTED Final   Klebsiella oxytoca NOT DETECTED NOT DETECTED Final   Klebsiella pneumoniae NOT DETECTED NOT DETECTED Final   Serratia marcescens NOT DETECTED NOT DETECTED Final   Carbapenem resistance NOT DETECTED NOT DETECTED Final   Haemophilus influenzae NOT DETECTED NOT DETECTED Final   Neisseria meningitidis NOT DETECTED NOT DETECTED Final   Pseudomonas aeruginosa NOT DETECTED NOT DETECTED Final   Candida albicans NOT DETECTED NOT DETECTED Final   Candida glabrata NOT DETECTED NOT DETECTED Final   Candida krusei NOT DETECTED NOT DETECTED Final   Candida parapsilosis NOT DETECTED NOT DETECTED Final   Candida tropicalis NOT DETECTED NOT DETECTED Final  MRSA PCR Screening     Status: None   Collection Time: 10/26/2017  5:16 PM  Result Value Ref Range Status   MRSA by PCR NEGATIVE NEGATIVE Final    Comment:        The GeneXpert MRSA Assay (FDA approved for NASAL specimens only), is one component of a comprehensive MRSA colonization surveillance program. It is not intended to diagnose MRSA infection nor to guide or monitor treatment for MRSA infections.     Janene Madeira, MSN, NP-C [facility] for Infectious [place] Cell: [phone] Pager: [phone]  10/22/2017 11:07 AM

## 2017-10-22 NOTE — Consult Note (Addendum)
WOC Nurse wound consult note Reason for Consult: multiple wounds, unstageable on Bilateral hips,and coccyx, DTI right ischium, and bilateral knees. Wound type:pressure Pressure Injury POA: Yes Measurement:Left hip 12cm x 6cm x 0.1cm unstageable 100% black eschar, periwound is reddened, moderate exudate, malodorous Right hip 1cm x 2cm x 0.1cm unstageable with 100% black eschar, scant serous drainage, slight redness surrounding perimeter, no odor. Coccyx 2.5cm x 2cm unstageable with 100% gray, loose slough, scant drainage, no odor. Right ischial tuberosity with 1.5cm 2cm purple DTI Left knee abrasions, right knee with 8cm x 3cm DTI and 1cm x 2.5 DTI.  Wound bed:see above Drainage (amount, consistency, odor) see above Periwound:see above Dressing procedure/placement/frequency: I have provided nurses with orders for bilateral Prevalon Boots to prevent heel injuries. Patient has extensive wounds with poor nutritional level. Goals of care are in process of being decided. Staff states awaiting family to arrive to discuss. Will refrain from writing wound care orders until Goals of Care are decided. If patient is to be a full code a Surgical Consult is suggested. Wounds currently covered for protection. We will not officially follow, but will remain available to this patient, to nursing, and the medical and/or surgical teams.  Please re-consult if we need to assist further.   Barnett HatterMelinda Brewer Hitchman, RN-C, WTA-C Wound Treatment Associate

## 2017-10-22 NOTE — Progress Notes (Addendum)
PULMONARY / CRITICAL CARE MEDICINE   Name: Raymond Fox MRN: [id number] DOB: 28-Feb-1961    ADMISSION DATE:  11/05/2017    CHIEF COMPLAINT: Out of hospital cardiopulmonary arrest  HISTORY OF PRESENT ILLNESS:   Patient is a chronic alcoholic with a history of substance abuse and encephalopathy for the past 2 weeks has shown very little interest in self-care.  He has remained in bed and not used his scheduled inhalers.  This morning he was breathing normally when first seen by his girlfriend who then went to get a cup of coffee.  When she returned he was apneic and she initiated CPR when EMS arrived he was given 1 mg of epinephrine and 2 A of Narcan he was converted from asystole to PEA and subsequently to sinus rhythm.  He was started on levo fed in the department of emergency medicine where he has since converted to atrial fibrillation with a rapid ventricular response.  He has not awakened after this event.  His girlfriend denies any chest pain or unusual shortness of breath prior to the arrest.  She is not aware that he ingested any unusual street drugs as part of the New Year's holiday.   SUBJECTIVE:  Exhibiting evidence for myoclonus, propofol increased compared with 1/1  VITAL SIGNS: BP 111/70   Pulse 78   Temp (!) 97 F (36.1 C) (Core)   Resp (!) 24   Ht 5\' 8"  (1.727 m)   Wt 50.2 kg (110 lb 11.8 oz)   SpO2 100%   BMI 16.84 kg/m   HEMODYNAMICS:    VENTILATOR SETTINGS: Vent Mode: PRVC FiO2 (%):  [40 %-100 %] 40 % Set Rate:  [18 bmp-24 bmp] 24 bmp Vt Set:  [550 mL-560 mL] 550 mL PEEP:  [5 cmH20] 5 cmH20 Plateau Pressure:  [22 cmH20-29 cmH20] 22 cmH20  INTAKE / OUTPUT: I/O last 3 completed shifts: In: 1405.1 [I.V.:1285.1; IV Piggyback:120] Out: 260 [Urine:260]  PHYSICAL EXAMINATION: General: Ill-appearing, mechanically ventilated, sedated Neuro: Sedated, pupils sluggish, upper gaze, intermittent facial and shoulder my clonus Cardiovascular: Irregularly irregular,  no murmur Lungs: Coarse bilateral breath sounds, no wheezing Abdomen: Soft, hypoactive bowel sounds Skin: pressure wounds and eschar sacrum and B hips   LABS:  BMET Recent Labs  Lab 07-17-18 1000 07-17-18 1509 07-17-18 1740 10/22/17 0404  NA 140  --  134* 134*  K 4.1  --  3.6 3.5  CL 111  --  104 108  CO2 19*  --  23 19*  BUN 16  --  21* 24*  CREATININE 1.10 1.16 1.35* 1.38*  GLUCOSE 101*  --  167* 100*    Electrolytes Recent Labs  Lab 07-17-18 1000 07-17-18 1740 10/22/17 0404  CALCIUM 6.9* 6.9* 7.3*  MG 1.7  --  1.5*  PHOS  --   --  3.4    CBC Recent Labs  Lab 07-17-18 1000 07-17-18 1509 10/22/17 0411  WBC 21.6* 32.1* 26.5*  HGB 11.3* 12.3* 11.8*  HCT 37.7* 39.9 37.4*  PLT 296 329 340    Coag's Recent Labs  Lab 07-17-18 1740 10/22/17 0156  APTT 33 92*  INR 1.30 1.24    Sepsis Markers Recent Labs  Lab 07-17-18 1509 07-17-18 1524 07-17-18 1740  LATICACIDVEN 2.3* 2.25* 3.0*    ABG Recent Labs  Lab 07-17-18 1037 07-17-18 1437 10/22/17 0427  PHART 7.170* 7.306* 7.398  PCO2ART 54.2* 37.3 33.5  PO2ART 427.0* 117.0* 176*    Liver Enzymes Recent Labs  Lab 07-17-18 1000  AST 106*  ALT 41  ALKPHOS 59  BILITOT 0.8  ALBUMIN 1.6*    Cardiac Enzymes Recent Labs  Lab 11/15/2017 1509 11/05/2017 1743 10/22/17 0156  TROPONINI 0.24* 0.41* 0.44*    Glucose Recent Labs  Lab 11/04/2017 1849 11/14/2017 1947 11/17/2017 2103 10/24/2017 2349 10/22/17 0355 10/22/17 0747  GLUCAP 74 70 253* 127* 93 81    Imaging Ct Head Wo Contrast  Result Date: 10/31/2017 CLINICAL DATA:  Altered level of consciousness (LOC), unexplained EXAM: CT HEAD WITHOUT CONTRAST TECHNIQUE: Contiguous axial images were obtained from the base of the skull through the vertex without intravenous contrast. COMPARISON:  02/04/2016 FINDINGS: Brain: No evidence of acute infarction, hemorrhage, hydrocephalus, extra-axial collection, or mass lesion/mass effect. Stable mild cerebral  atrophy and moderate chronic small vessel disease. Vascular:  No hyperdense vessel or other acute findings. Skull: No evidence of fracture or other significant bone abnormality. Sinuses/Orbits: Near complete opacification of right maxillary sinus, new since prior study. Other: None. IMPRESSION: No acute intracranial abnormality. Stable cerebral atrophy and chronic small vessel disease. Near complete opacification of right maxillary sinus. Electronically Signed   By: Myles Rosenthal M.D.   On: 10/26/2017 16:39   Ct Angio Chest Pe W Or Wo Contrast  Result Date: 11/12/2017 CLINICAL DATA:  Found down by  spouse.  Cardiac arrest. EXAM: CT ANGIOGRAPHY CHEST CT ABDOMEN AND PELVIS WITH CONTRAST TECHNIQUE: Multidetector CT imaging of the chest was performed using the standard protocol during bolus administration of intravenous contrast. Multiplanar CT image reconstructions and MIPs were obtained to evaluate the vascular anatomy. Multidetector CT imaging of the abdomen and pelvis was performed using the standard protocol during bolus administration of intravenous contrast. CONTRAST:  ISOVUE-370 IOPAMIDOL (ISOVUE-370) INJECTION 76% COMPARISON:  12/13/2016 FINDINGS: CTA CHEST FINDINGS Cardiovascular: Filling defects within the right lower lobe pulmonary artery is identified, image 97 of series 6 and image 52 of series 7, compatible with acute pulmonary emboli. Normal heart size. Aortic atherosclerosis. Calcification in the LAD coronary artery identified. No pericardial effusion. Mediastinum/Nodes: The trachea appears patent and is midline. ET tube tip is above the carina. No enlarged mediastinal or hilar lymph nodes. No axillary or supraclavicular adenopathy. Lungs/Pleura: No pleural effusion. Scar like density noted within the anterior right apex. Scattered peripheral and lower lobe predominant tree-in-bud nodularity is identified bilaterally. In the right middle lobe there is a 1.3 cm cystic nodule, image number 97 of  series 4. Part solid nodule in the right middle lobe is also noted measuring 1 cm, image 78 of series 4. Musculoskeletal: The bones appear osteopenic. No suspicious bone lesions. Compression deformities at T7 and T8 are identified and appear age indeterminate. Review of the MIP images confirms the above findings. CT ABDOMEN and PELVIS FINDINGS Hepatobiliary: There is no suspicious liver abnormality. Diffuse edema of the gallbladder wall identified. No gallstones visualized. No biliary dilatation. Pancreas: Unremarkable. No pancreatic ductal dilatation or surrounding inflammatory changes. Spleen: Status post splenectomy. Adrenals/Urinary Tract: Normal adrenal glands. The kidneys are both unremarkable. No hydronephrosis or mass identified. Urinary bladder is collapsed around a Foley catheter balloon. Stomach/Bowel: The stomach is unremarkable. The small bowel loops have a normal course and caliber without obstruction. Moderate stool burden identified throughout the colon. No pathologic dilatation of the large or small bowel loops. Vascular/Lymphatic: Aortic atherosclerosis without aneurysm. Periaortic node is prominent measuring 1 cm, image 24 of series 5. No pelvic or inguinal adenopathy. There is a right femoral central venous catheter with tip in the right internal iliac  vein. Reproductive: Prostate gland is obscured by beam hardening artifact from right hip arthroplasty device. Other: There is a moderate amount of ascites within the pelvis. Musculoskeletal: Previous right hip arthroplasty. Posterior decompression and hardware fixation of L5-S1 identified. Review of the MIP images confirms the above findings. IMPRESSION: 1. Examination is positive for acute pulmonary embolus. Critical Value/emergent results were called by telephone at the time of interpretation on 10/24/2017 at 4:52 pm to Dr. Warren Lacy , who verbally acknowledged these results. 2. There is diffuse gallbladder wall edema. Correlate for any clinical  signs or symptoms of cholecystitis. 3. Ascites 4. Aortic Atherosclerosis (ICD10-I70.0). Lad coronary artery calcification noted. 5. There are 2 sub solid nodules identified in the right middle lobe. Non-contrast chest CT at 3-6 months is recommended. If nodules persist, subsequent management will be based upon the most suspicious nodule(s). This recommendation follows the consensus statement: Guidelines for Management of Incidental Pulmonary Nodules Detected on CT Images: From the Fleischner Society 2017; Radiology 2017; 284:228-243. 6. Diffuse bronchial wall thickening and scattered peripheral and lower lobe predominant tree-in-bud nodules likely reflects sequelae of chronic inflammatory or infectious bronchiolitis. Electronically Signed   By: Signa Kell M.D.   On: 11/04/2017 16:55   Ct Abdomen Pelvis W Contrast  Result Date: 11/13/2017 CLINICAL DATA:  Found down by  spouse.  Cardiac arrest. EXAM: CT ANGIOGRAPHY CHEST CT ABDOMEN AND PELVIS WITH CONTRAST TECHNIQUE: Multidetector CT imaging of the chest was performed using the standard protocol during bolus administration of intravenous contrast. Multiplanar CT image reconstructions and MIPs were obtained to evaluate the vascular anatomy. Multidetector CT imaging of the abdomen and pelvis was performed using the standard protocol during bolus administration of intravenous contrast. CONTRAST:  ISOVUE-370 IOPAMIDOL (ISOVUE-370) INJECTION 76% COMPARISON:  12/13/2016 FINDINGS: CTA CHEST FINDINGS Cardiovascular: Filling defects within the right lower lobe pulmonary artery is identified, image 97 of series 6 and image 52 of series 7, compatible with acute pulmonary emboli. Normal heart size. Aortic atherosclerosis. Calcification in the LAD coronary artery identified. No pericardial effusion. Mediastinum/Nodes: The trachea appears patent and is midline. ET tube tip is above the carina. No enlarged mediastinal or hilar lymph nodes. No axillary or supraclavicular  adenopathy. Lungs/Pleura: No pleural effusion. Scar like density noted within the anterior right apex. Scattered peripheral and lower lobe predominant tree-in-bud nodularity is identified bilaterally. In the right middle lobe there is a 1.3 cm cystic nodule, image number 97 of series 4. Part solid nodule in the right middle lobe is also noted measuring 1 cm, image 78 of series 4. Musculoskeletal: The bones appear osteopenic. No suspicious bone lesions. Compression deformities at T7 and T8 are identified and appear age indeterminate. Review of the MIP images confirms the above findings. CT ABDOMEN and PELVIS FINDINGS Hepatobiliary: There is no suspicious liver abnormality. Diffuse edema of the gallbladder wall identified. No gallstones visualized. No biliary dilatation. Pancreas: Unremarkable. No pancreatic ductal dilatation or surrounding inflammatory changes. Spleen: Status post splenectomy. Adrenals/Urinary Tract: Normal adrenal glands. The kidneys are both unremarkable. No hydronephrosis or mass identified. Urinary bladder is collapsed around a Foley catheter balloon. Stomach/Bowel: The stomach is unremarkable. The small bowel loops have a normal course and caliber without obstruction. Moderate stool burden identified throughout the colon. No pathologic dilatation of the large or small bowel loops. Vascular/Lymphatic: Aortic atherosclerosis without aneurysm. Periaortic node is prominent measuring 1 cm, image 24 of series 5. No pelvic or inguinal adenopathy. There is a right femoral central venous catheter with tip in the  right internal iliac vein. Reproductive: Prostate gland is obscured by beam hardening artifact from right hip arthroplasty device. Other: There is a moderate amount of ascites within the pelvis. Musculoskeletal: Previous right hip arthroplasty. Posterior decompression and hardware fixation of L5-S1 identified. Review of the MIP images confirms the above findings. IMPRESSION: 1. Examination is  positive for acute pulmonary embolus. Critical Value/emergent results were called by telephone at the time of interpretation on 11/09/2017 at 4:52 pm to Dr. Warren Lacy , who verbally acknowledged these results. 2. There is diffuse gallbladder wall edema. Correlate for any clinical signs or symptoms of cholecystitis. 3. Ascites 4. Aortic Atherosclerosis (ICD10-I70.0). Lad coronary artery calcification noted. 5. There are 2 sub solid nodules identified in the right middle lobe. Non-contrast chest CT at 3-6 months is recommended. If nodules persist, subsequent management will be based upon the most suspicious nodule(s). This recommendation follows the consensus statement: Guidelines for Management of Incidental Pulmonary Nodules Detected on CT Images: From the Fleischner Society 2017; Radiology 2017; 284:228-243. 6. Diffuse bronchial wall thickening and scattered peripheral and lower lobe predominant tree-in-bud nodules likely reflects sequelae of chronic inflammatory or infectious bronchiolitis. Electronically Signed   By: Signa Kell M.D.   On: 11/03/2017 16:55   Dg Chest Port 1 View  Result Date: 10/22/2017 CLINICAL DATA:  56 year old male with a history of respiratory failure EXAM: PORTABLE CHEST 1 VIEW COMPARISON:  CT 10/22/2017, chest x-ray 11/08/2017 FINDINGS: Cardiomediastinal silhouette unchanged in size and contour. No evidence of central vascular congestion. No pneumothorax. Nodular change at the right lung apex, corresponds to pleuroparenchymal scarring/thickening on the prior CT chest. No confluent airspace disease/consolidation.  No pleural effusion. Endotracheal tube terminates approximately 4.2 cm above the carina. Gastric tube has been placed in the interval, terminating out of the field of view. Defibrillator pads unchanged. IMPRESSION: Chronic lung changes without evidence of new airspace consolidation or pneumothorax. Unchanged endotracheal tube, terminating suitably above the carina. Interval  placement gastric tube which terminates out of the field of view. Unchanged defibrillator pads. Electronically Signed   By: Gilmer Mor D.O.   On: 10/22/2017 07:21   Dg Chest Portable 1 View  Result Date: 10/26/2017 CLINICAL DATA:  57 year old male with a history of intubation, cardiac arrest EXAM: PORTABLE CHEST 1 VIEW COMPARISON:  12/21/2016 FINDINGS: Cardiomediastinal silhouette unchanged. Endotracheal tube terminates 6.5 cm above the carina. Defibrillator pads project over the chest. No large pleural effusion. No confluent airspace disease or pneumothorax. Partial healing of multiple bilateral rib fractures, incompletely visualized. IMPRESSION: Endotracheal tube terminates suitably above the carina. No evidence of confluent airspace disease, pneumothorax, or large pleural effusion. Defibrillator pads on the chest wall. Multiple partially healed/ remodeled bilateral rib fractures. Electronically Signed   By: Gilmer Mor D.O.   On: 11/15/2017 10:40   Dg Abd Portable 1v  Result Date: 10/29/2017 CLINICAL DATA:  OG tube placement. EXAM: PORTABLE ABDOMEN - 1 VIEW COMPARISON:  CT abdomen and pelvis from the same day. FINDINGS: The side port of the OG tube is in the fundus the stomach. Lung bases are clear. Defibrillator pad is in place. The stomach is decompressed. IMPRESSION: Side port of the OG tube is in the fundus of the stomach. Electronically Signed   By: Marin Roberts M.D.   On: 11/06/2017 19:12     STUDIES:  CT scan of the head, CTA of the chest, and CT of the abdomen pelvis specifically to look a potential fluid collection in the left hip are all pending.  CULTURES:  Blood 1/1 >>  resp 1/1 >>  Urine 1/1 >>   ANTIBIOTICS: Vancomycin 1/1 >>  Zosyn 1/1 >>   Lines TUBES: Right femoral triple-lumen 1/1 >>  ETT 1/1 >>   DISCUSSION: This is a 57 year old with a history of chronic alcohol abuse and chronic alcoholic encephalopathy who at baseline seems to have lost interest in  self-care.  For the past 2 weeks he is not left bed.  He was apparently in his normal state of health this morning when seen by his girlfriend who left the room to get a cup of coffee and when she returned he was not breathing.  Bystander CPR was performed and subsequently EMS provided CPR.  Initial rhythm was asystole followed by PEA. CT-PA shows PE  ASSESSMENT / PLAN:  PULMONARY A:  Acute respiratory failure Pulmonary embolism, presumed acute Tobacco abuse, presumed COPD RML pulmonary nodules Mechanical ventilation, assess for possible spontaneous breathing depending on mental status Heparin drip infusing Scheduled bronchodilators  CARDIOVASCULAR A:  PEA arrest, suspect multifactorial in nature: Infection, PE, substances Shock, cardiogenic plus possible septic Stress non-ST elevation MI Atrial fibrillation Echocardiogram ordered and pending Norepi currently at low dose, wean as able, goal MAP > 65 Heparin drip Rate control Antibiotics as below  RENAL A:   Acute renal failure, ATN P:   Follow BMP and UOP Replace electrolytes as indicated/ Ensure adequate renal perfusion  GASTROINTESTINAL A:   SUP  P:   PPI  HEMATOLOGIC A:   Leukocytosis Anemia chronic disease; note stool heme positive P:  Follow CBC Transfusion threshold hgb > 7.0  ENDOCRINE A:   At risk hyperglycemia P:   Follow CBG on BMP  INFECTIOUS A: Pressure wounds bilateral hips and sacrum, question cellulitis Empiric Zosyn and vancomycin as ordered May need consider debridement.  We will defer this decision until goals of care are clarified regarding his overall prognosis CT scan of the hip was not performed  NEUROLOGIC A: \ Acute encephalopathy, myoclonus and presumed anoxic injury Normothermia protocol in place Will attempt to lighten sedation to assess neuro status today - propofol increased due to myoclonus  FAMILY  Girlfriend and mother have visited 1/1. Need to speak with them again  1/2 regarding neuro findings, poor prognosis. Decision making regarding imaging or debridement hip wounds will be influenced by our overall goals of care. Based on neuro findings I suspect that prognosis for a meaningful recovery here very poor.   Independent CC time 35 minutes  Levy Pupa, MD, PhD 10/22/2017, 9:24 AM [facility] [phone] or if no answer [phone]

## 2017-10-23 ENCOUNTER — Inpatient Hospital Stay (HOSPITAL_COMMUNITY): Payer: Medicare HMO

## 2017-10-23 DIAGNOSIS — I2609 Other pulmonary embolism with acute cor pulmonale: Secondary | ICD-10-CM

## 2017-10-23 LAB — GLUCOSE, CAPILLARY
GLUCOSE-CAPILLARY: 68 mg/dL (ref 65–99)
GLUCOSE-CAPILLARY: 83 mg/dL (ref 65–99)
Glucose-Capillary: 85 mg/dL (ref 65–99)
Glucose-Capillary: 68 mg/dL (ref 65–99)
Glucose-Capillary: 70 mg/dL (ref 65–99)

## 2017-10-23 LAB — BASIC METABOLIC PANEL
Anion gap: 9 (ref 5–15)
BUN: 26 mg/dL — ABNORMAL HIGH (ref 6–20)
CO2: 18 mmol/L — AB (ref 22–32)
Calcium: 7.7 mg/dL — ABNORMAL LOW (ref 8.9–10.3)
Chloride: 107 mmol/L (ref 101–111)
Creatinine, Ser: 1.58 mg/dL — ABNORMAL HIGH (ref 0.61–1.24)
GFR calc Af Amer: 54 mL/min — ABNORMAL LOW (ref >60)
GFR, EST NON AFRICAN AMERICAN: 47 mL/min — AB (ref >60)
GLUCOSE: 81 mg/dL (ref 65–99)
POTASSIUM: 3.8 mmol/L (ref 3.5–5.1)
Sodium: 134 mmol/L — ABNORMAL LOW (ref 135–145)

## 2017-10-23 LAB — CBC
HCT: 34.3 % — ABNORMAL LOW (ref 39.0–52.0)
HEMOGLOBIN: 10.8 g/dL — AB (ref 13.0–17.0)
MCH: 28 pg (ref 26.0–34.0)
MCHC: 31.5 g/dL (ref 30.0–36.0)
MCV: 88.9 fL (ref 78.0–100.0)
PLATELETS: 363 10*3/uL (ref 150–400)
RBC: 3.86 MIL/uL — AB (ref 4.22–5.81)
RDW: 16.2 % — ABNORMAL HIGH (ref 11.5–15.5)
WBC: 19.6 10*3/uL — AB (ref 4.0–10.5)

## 2017-10-23 LAB — URINE CULTURE
CULTURE: NO GROWTH
Special Requests: NORMAL

## 2017-10-23 LAB — HEPARIN LEVEL (UNFRACTIONATED)
Heparin Unfractionated: 0.22 IU/mL — ABNORMAL LOW (ref 0.30–0.70)
Heparin Unfractionated: 0.44 IU/mL (ref 0.30–0.70)

## 2017-10-23 MED ORDER — HEPARIN BOLUS VIA INFUSION
1500.0000 [IU] | Freq: Once | INTRAVENOUS | Status: AC
  Administered 2017-10-23: 1500 [IU] via INTRAVENOUS
  Filled 2017-10-23: qty 1500

## 2017-10-23 MED ORDER — DEXTROSE-NACL 5-0.45 % IV SOLN
INTRAVENOUS | Status: DC
  Administered 2017-10-23: 16:00:00 via INTRAVENOUS

## 2017-10-23 MED ORDER — DEXTROSE 50 % IV SOLN
INTRAVENOUS | Status: AC
  Administered 2017-10-23: 50 mL
  Filled 2017-10-23: qty 50

## 2017-10-23 MED ORDER — MORPHINE BOLUS VIA INFUSION
5.0000 mg | INTRAVENOUS | Status: DC | PRN
  Administered 2017-10-23 (×2): 15 mg via INTRAVENOUS
  Administered 2017-10-23 (×2): 20 mg via INTRAVENOUS
  Filled 2017-10-23: qty 20

## 2017-10-23 MED ORDER — SODIUM CHLORIDE 0.9 % IV SOLN
10.0000 mg/h | INTRAVENOUS | Status: DC
  Administered 2017-10-23: 10 mg/h via INTRAVENOUS
  Filled 2017-10-23: qty 10

## 2017-10-25 LAB — CULTURE, BLOOD (ROUTINE X 2)

## 2017-10-26 LAB — RNA QUALITATIVE

## 2017-10-26 LAB — HIV 1/2 AB DIFFERENTIATION
HIV 1 Ab: NEGATIVE
HIV 2 Ab: UNDETERMINED

## 2017-10-26 LAB — HIV ANTIBODY (ROUTINE TESTING W REFLEX): HIV Screen 4th Generation wRfx: REACTIVE — AB

## 2017-10-27 LAB — CULTURE, BLOOD (ROUTINE X 2)
CULTURE: NO GROWTH
Culture: NO GROWTH
Special Requests: ADEQUATE

## 2017-10-28 LAB — CULTURE, BLOOD (ROUTINE X 2)

## 2017-11-03 DIAGNOSIS — K922 Gastrointestinal hemorrhage, unspecified: Secondary | ICD-10-CM | POA: Diagnosis not present

## 2017-11-03 DIAGNOSIS — J441 Chronic obstructive pulmonary disease with (acute) exacerbation: Secondary | ICD-10-CM | POA: Diagnosis not present

## 2017-11-03 DIAGNOSIS — J449 Chronic obstructive pulmonary disease, unspecified: Secondary | ICD-10-CM | POA: Diagnosis not present

## 2017-11-03 DIAGNOSIS — M6281 Muscle weakness (generalized): Secondary | ICD-10-CM | POA: Diagnosis not present

## 2017-11-10 ENCOUNTER — Telehealth: Payer: Self-pay

## 2017-11-10 ENCOUNTER — Telehealth: Payer: Self-pay | Admitting: Emergency Medicine

## 2017-11-10 NOTE — Telephone Encounter (Signed)
Spoke with Nichole with [facility] [facility] She has called [facility] in need of copy of death certificate signed by RB RB is in office tomorrow,   Routing message to Lillia AbedLindsay to advise that this certificate is needing signed and returned back to [place] [facility] as soon as possible tomorrow.

## 2017-11-10 NOTE — Telephone Encounter (Signed)
On 11/10/17 I received a d[facility]c from [facility] (faxed). The d/c is for cremation. The patient is a patient of Doctor Byrum. The d/c will be taken to Pulmonary Unit @ [facility] for signature.  On 11/12/17 I received the d/c back from Doctor Byrum. I got the d/c ready and called the funeral home to let them know I faxed the d/c to the funeral home per the funeral home request.

## 2017-11-12 NOTE — Discharge Summary (Signed)
PULMONARY / CRITICAL CARE MEDICINE DEATH SUMMARY   Name: Raymond Fox MRN: [id number] DOB: April 23, 1961    ADMISSION DATE:  11/17/2017  DATE OF DEATH: 10/24/2017   FINAL CAUSE OF DEATH: Anoxic brain injury  SECONDARY CAUSES OF DEATH: Cardiopulmonary arrest Acute pulmonary embolism Acute respiratory failure Cardiogenic and septic shock Stress non-ST elevation MI Acute renal failure, ATN Atrial fibrillation with RVR Anemia of chronic disease Bacteremia, probably MRSA Sacral pressure wound, bilateral hip pressure wounds Possible cellulitis History of alcohol abuse Transaminitis Possible GI blood loss    CHIEF COMPLAINT: Out of hospital cardiopulmonary arrest  HISTORY OF PRESENT ILLNESS /Hospital course:   57 year old chronic alcoholic with a history of substance abuse and encephalopathy, for the past 2 weeks has shown very little interest in self-care.  He has remained in bed and not used his scheduled inhalers.  Who initiated CPR.  Noted to be in PEA.  Converted to atrial fibrillation with RVR.  Evaluation revealed acute pulmonary embolism.  He was intubated and supported.  Blood cultures grew out gram-positive cocci, possibly MRSA.  Course complicated by acute renal failure, transaminitis, encephalopathy.  He developed my clonus.  He was completely unresponsive even off of sedating medications.  Based on the severity of his neurological injury, poor prognosis for meaningful recovery, discussions were undertaken with patient's family regarding goals for care.  Decision was made to transition him to comfort and he was extubated on 10/21/2017.  He expired on 10/24/17.    STUDIES:  CT scan of the head, CTA of the chest, and CT of the abdomen pelvis specifically to look a potential fluid collection in the left hip are all pending. Echo 1/2/ >> LVEF reduced, 35-40%, diffuse hypokinesis, abnormal relaxation with grade 2 diastolic dysfunction.  Dilated right ventricle with moderately reduced  function, estimated pulmonary pressure is normal  CULTURES: Blood 1/1 >> GPC >>  BCID panel >> MRSA resp 1/1 >>  Urine 1/1 >> negative  ANTIBIOTICS: Vancomycin 1/1 >>  Zosyn 1/1 >> 1/1 Ceftriaxone 1/1 >>  Flagyl 1/1 >>   Lines TUBES: Right femoral triple-lumen 1/1 >>  ETT 1/1 >>   [name], MD, PhD 11/12/2017, 9:08 PM [facility] [phone] or if no answer [phone]

## 2017-11-12 NOTE — Telephone Encounter (Signed)
Signed death certificate has been signed and is waiting for pickup.   Will close this message.

## 2017-11-13 ENCOUNTER — Telehealth: Payer: Self-pay

## 2017-11-13 NOTE — Telephone Encounter (Signed)
On 11/13/17 I received a [facility] (original). The d/c is for cremation. The patient is a patient of Doctor Byrum. The d/c will be taken to Pulmonary Unit @ [facility] for signature.  On 11/18/17 I received the d/c back from Doctor Byrum. I got the d/c ready and called the funeral home to let them know the d/c was mailed to vital records per the funeral home request.

## 2017-11-21 NOTE — Progress Notes (Signed)
ANTICOAGULATION CONSULT NOTE - Initial Consult  Pharmacy Consult for Heparin Indication: pulmonary embolus  Allergies  Allergen Reactions  . Penicillins Rash    Tolerated zosyn for 3 days without problems 1/23 Has patient had a PCN reaction causing immediate rash, facial/tongue/throat swelling, SOB or lightheadedness with hypotension: Yes Has patient had a PCN reaction causing severe rash involving mucus membranes or skin necrosis: No Has patient had a PCN reaction that required hospitalization No Has patient had a PCN reaction occurring within the last 10 years: No If all of the above answers are "NO", then may proceed with Cephalosporin use. 1    Patient Measurements: Height: 5\' 8"  (172.7 cm) Weight: 117 lb 2.1 oz (53.1 kg) IBW/kg (Calculated) : 68.4 Heparin Dosing Weight: 49.9 kg  Vital Signs: Temp: 98.6 F (37 C) (01/03 1400) Temp Source: Core (01/03 1400) BP: 102/76 (01/03 1400) Pulse Rate: 85 (01/03 1400)  Labs: Recent Labs    2017-11-23 1509 2017-11-23 1740 2017-11-23 1743 10/22/17 0156 10/22/17 0404 10/22/17 0411  10/22/17 2125 11/06/2017 0420 11/20/2017 1359  HGB 12.3*  --   --   --   --  11.8*  --   --  10.8*  --   HCT 39.9  --   --   --   --  37.4*  --   --  34.3*  --   PLT 329  --   --   --   --  340  --   --  363  --   APTT  --  33  --  92*  --   --   --   --   --   --   LABPROT  --  16.1*  --  15.5*  --   --   --   --   --   --   INR  --  1.30  --  1.24  --   --   --   --   --   --   HEPARINUNFRC  --   --   --  0.43  --   --    < > 0.41 0.22* 0.44  CREATININE 1.16 1.35*  --   --  1.38*  --   --   --  1.58*  --   TROPONINI 0.24*  --  0.41* 0.44*  --   --   --   --   --   --    < > = values in this interval not displayed.    Estimated Creatinine Clearance: 38.7 mL/min (A) (by C-G formula based on SCr of 1.58 mg/dL (H)).   Assessment: [age] yoM presents with PEA arrest and acute PE. Pharmacy consulted to dose heparin.  -Heparin level at goal after increase to  1100 units/hr   Goal of Therapy:  Heparin level 0.3-0.7 units/ml Monitor platelets by anticoagulation protocol: Yes   Plan:  -No heparin changes needed -Confirm a heparin level in 8 hrs -Daily heparin level and CBC  Harland GermanAndrew Ayleah Hofmeister, Pharm D 11/04/2017 3:21 PM

## 2017-11-21 NOTE — Progress Notes (Signed)
End of life Consult.  Patient was not conscious.  Met the family-mother, and another relative there with patient.  Had prayer for/with patient and family for peace and comfort and to know the love of his family there around him and pastor. Conard Novak, Chaplain   11/16/17 1900  Clinical Encounter Type  Visited With Patient and family together  Visit Type Initial;Spiritual support;Other (Comment) (End of life)  Referral From Nurse  Consult/Referral To Chaplain  Spiritual Encounters  Spiritual Needs Prayer;Emotional  Stress Factors  Patient Stress Factors Other (Comment) (unknown-maybe fear of death)  Family Stress Factors Other (Comment) (End of Life-prayer for peace and comfort)

## 2017-11-21 NOTE — Progress Notes (Signed)
Patient ID: [name], male   DOB: [birth year], 57 y.o.   MRN: [id number]          [facility]    Date of Admission:  11/17/2017   Total days of antibiotics 2         Admission blood cultures have grown MRSA and Proteus mirabilis.  His infected left hip wound is the probable source.  We will continue current antibiotics pending further observation and discussions of goals of care.         Cliffton AstersJohn Editha Bridgeforth, MD [facility] [phone] pager   [phone] cell 11/10/2017, 5:11 PM

## 2017-11-21 NOTE — Progress Notes (Signed)
BG 68. Amp D50 given per protocol.   Delories HeinzMelissa Makaio Mach, RN

## 2017-11-21 NOTE — Procedures (Signed)
Pt terminally extubated per order without complications.

## 2017-11-21 NOTE — Progress Notes (Signed)
Pt time of death pronounced by Windy CannyJa'Hana Tabby Beaston RN and confirmed by Delories HeinzMelissa Garstka RN at 2045 October 23, 2017. [facility] notified and Post Mortem checklist completed. Pt transported to morgue.   Family and pt's Renato Gailsastor were at the bedside during death.

## 2017-11-21 NOTE — Care Management Note (Signed)
Case Management Note Donn PieriniKristi Arleen Bar RN, BSN Unit 4E-Case Manager-- 2H coverage [phone]  Patient Details  Name: Raymond LainWilliam R Oriley MRN: [id number] Date of Birth: [birth year]  Subjective/Objective:  Pt admitted s/p cardiopulmonary arrest outside of hospital                  Action/Plan: PTA pt lived at home with girlfriend,  Hx of substance abuse- per MD note pt had recent change in his behavior to show little interest in self care.  CM to follow for transition of care needs-   Expected Discharge Date:                  Expected Discharge Plan:     In-House Referral:     Discharge planning Services  CM Consult  Post Acute Care Choice:    Choice offered to:     DME Arranged:    DME Agency:     HH Arranged:    HH Agency:     Status of Service:  In process, will continue to follow  If discussed at Long Length of Stay Meetings, dates discussed:    Discharge Disposition:   Additional Comments:  Darrold SpanWebster, Othelia Riederer Hall, RN 11/20/2017, 10:16 AM

## 2017-11-21 NOTE — Progress Notes (Signed)
PCCM Interval Note  To meet with the patient's mother at bedside this evening.  [facility] with the assistance of her pastor and her granddaughter.  We reviewed the status of Raymond Fox, the underlying problems and the evidence for Raymond Fox profound brain injury.  Based on this and Raymond Fox poor prognosis she has decided that she would like to proceed with extubation and a transition to full comfort care.  I will place the orders in the chart so that we can proceed.  Independent Cc time 30 minutes  Levy Pupaobert Josedejesus Marcum, MD, PhD 10/27/2017, 5:52 PM [facility] [phone] or if no answer [phone]

## 2017-11-21 NOTE — Progress Notes (Addendum)
PULMONARY / CRITICAL CARE MEDICINE   Name: Raymond Fox MRN: [id number] DOB: 1961-08-14    ADMISSION DATE:  11/19/2017    CHIEF COMPLAINT: Out of hospital cardiopulmonary arrest  HISTORY OF PRESENT ILLNESS:   Patient is a chronic alcoholic with a history of substance abuse and encephalopathy for the past 2 weeks has shown very little interest in self-care.  He has remained in bed and not used his scheduled inhalers.  This morning he was breathing normally when first seen by his girlfriend who then went to get a cup of coffee.  When she returned he was apneic and she initiated CPR when EMS arrived he was given 1 mg of epinephrine and 2 A of Narcan he was converted from asystole to PEA and subsequently to sinus rhythm.  He was started on levo fed in the department of emergency medicine where he has since converted to atrial fibrillation with a rapid ventricular response.  He has not awakened after this event.  His girlfriend denies any chest pain or unusual shortness of breath prior to the arrest.  She is not aware that he ingested any unusual street drugs as part of the New Year's holiday.   SUBJECTIVE:  Significant my clonus when his propofol was decreased today 1/3 Pressors have been weaned to off No meaningful neurological responses Note positive blood cx's   VITAL SIGNS: BP 123/73   Pulse 94   Temp 98.8 F (37.1 C) (Core)   Resp (!) 24   Ht 5\' 8"  (1.727 m)   Wt 53.1 kg (117 lb 2.1 oz)   SpO2 100%   BMI 17.81 kg/m   HEMODYNAMICS:    VENTILATOR SETTINGS: Vent Mode: PRVC FiO2 (%):  [40 %] 40 % Set Rate:  [24 bmp] 24 bmp Vt Set:  [550 mL] 550 mL PEEP:  [5 cmH20] 5 cmH20 Plateau Pressure:  [20 cmH20-24 cmH20] 24 cmH20  INTAKE / OUTPUT: I/O last 3 completed shifts: In: 3614 [I.V.:3144; IV Piggyback:470] Out: 790 [Urine:790]  PHYSICAL EXAMINATION: General: Ill-appearing man, ventilated, sedated, Neuro: Upper gaze, gag intact, no myoclonus currently (on propofol),  completely unresponsive to any stimulation Cardiovascular: Irregularly irregular, no murmur Lungs: Coarse bilateral breath sounds, no wheezing Abdomen: Soft, hypoactive bowel sounds Skin: Sacral and hip pressure sores with some eschar   LABS:  BMET Recent Labs  Lab 11/16/2017 1740 10/22/17 0404 October 14, 2018 0420  NA 134* 134* 134*  K 3.6 3.5 3.8  CL 104 108 107  CO2 23 19* 18*  BUN 21* 24* 26*  CREATININE 1.35* 1.38* 1.58*  GLUCOSE 167* 100* 81    Electrolytes Recent Labs  Lab 10/30/2017 1000 11/06/2017 1740 10/22/17 0404 October 14, 2018 0420  CALCIUM 6.9* 6.9* 7.3* 7.7*  MG 1.7  --  1.5*  --   PHOS  --   --  3.4  --     CBC Recent Labs  Lab 10/30/2017 1509 10/22/17 0411 October 14, 2018 0420  WBC 32.1* 26.5* 19.6*  HGB 12.3* 11.8* 10.8*  HCT 39.9 37.4* 34.3*  PLT 329 340 363    Coag's Recent Labs  Lab 11/16/2017 1740 10/22/17 0156  APTT 33 92*  INR 1.30 1.24    Sepsis Markers Recent Labs  Lab 10/31/2017 1509 11/10/2017 1524 10/28/2017 1740  LATICACIDVEN 2.3* 2.25* 3.0*    ABG Recent Labs  Lab 11/19/2017 1037 11/17/2017 1437 10/22/17 0427  PHART 7.170* 7.306* 7.398  PCO2ART 54.2* 37.3 33.5  PO2ART 427.0* 117.0* 176*    Liver Enzymes Recent Labs  Lab  20-Nov-2017 1000  AST 106*  ALT 41  ALKPHOS 59  BILITOT 0.8  ALBUMIN 1.6*    Cardiac Enzymes Recent Labs  Lab 2017-11-20 1509 11-20-17 1743 10/22/17 0156  TROPONINI 0.24* 0.41* 0.44*    Glucose Recent Labs  Lab 10/22/17 1154 10/22/17 1529 10/22/17 1944 10/27/2017 0003 11/03/2017 0432 11/06/2017 0814  GLUCAP 76 79 78 83 85 68    Imaging Dg Chest Port 1 View  Result Date: 10/31/2017 CLINICAL DATA:  Intubation. EXAM: PORTABLE CHEST 1 VIEW COMPARISON:  10/22/2017.  11/15/2016.  11/01/2012 CT 2017-11-20. FINDINGS: Endotracheal tube and NG tube in stable position. Heart size normal. Mild chronic interstitial prominence, most likely secondary chronic interstitial lung disease. Mild stable pleural thickening, most  likely scarring . No pneumothorax. IMPRESSION: 1. NG tube noted with tip below left hemidiaphragm. 2. Chronic interstitial disease. No acute infiltrate . Pleural thickening consistent with scarring. Reference is made to prior chest CT report of 11/20/17 . Electronically Signed   By: Maisie Fus  Register   On: 11/15/2017 07:45     STUDIES:  CT scan of the head, CTA of the chest, and CT of the abdomen pelvis specifically to look a potential fluid collection in the left hip are all pending. Echo 1/2/ >> LVEF reduced, 35-40%, diffuse hypokinesis, abnormal relaxation with grade 2 diastolic dysfunction.  Dilated right ventricle with moderately reduced function, estimated pulmonary pressure is normal  CULTURES: Blood 1/1 >> GPC >>  BCID panel >> MRSA resp 1/1 >>  Urine 1/1 >> negative  ANTIBIOTICS: Vancomycin 1/1 >>  Zosyn 1/1 >> 1/1 Ceftriaxone 1/1 >>  Flagyl 1/1 >>   Lines TUBES: Right femoral triple-lumen 1/1 >>  ETT 1/1 >>   DISCUSSION: This is a 57 year old with a history of chronic alcohol abuse and chronic alcoholic encephalopathy who at baseline seems to have lost interest in self-care.  For the past 2 weeks he is not left bed.  He was apparently in his normal state of health this morning when seen by his girlfriend who left the room to get a cup of coffee and when she returned he was not breathing.  Bystander CPR was performed and subsequently EMS provided CPR.  Initial rhythm was asystole followed by PEA. CT-PA shows PE  ASSESSMENT / PLAN:  PULMONARY A:  Acute respiratory failure Pulmonary embolism, presumed acute Tobacco abuse, presumed COPD RML pulmonary nodules Continue current mechanical ventilation.  Based on his mental status and apparent neurological injury not a candidate for extubation Continue heparin drip Antibiotics as below Scheduled bronchodilators ordered  CARDIOVASCULAR A:  PEA arrest, suspect multifactorial in nature: Infection, PE, substances Shock,  cardiogenic plus septic, improved 1/3 Stress non-ST elevation MI Atrial fibrillation Norepinephrine weaned to off Continue heparin drip Continue rate control Antibiotics as below, contribution of sepsis  RENAL A:   Acute renal failure, ATN P:   Follow BMP, urine output Replace electrolytes as indicated/ Ensure adequate renal perfusion  GASTROINTESTINAL A:   SUP  P:   PPI  HEMATOLOGIC A:   Leukocytosis Anemia chronic disease; note stool heme positive P:  Follow CBC Transfusion threshold hgb > 7.0  ENDOCRINE A:   At risk hyperglycemia P:   Follow CBG on BMP  INFECTIOUS A: GPC bacteremia, BCID consistent with MRSA Pressure wounds bilateral hips and sacrum, question cellulitis Empiric Zosyn and vancomycin as ordered Have deferred surgical evaluation for wound debridement at this time due to his apparent debilitating neurological injury post CPR.  NEUROLOGIC A: \ Acute encephalopathy, myoclonus and presumed  anoxic injury; EEG > burst suppression Requiring propofol in order to control myoclonic activity.  Has not had any meaningful neurological improvement when propofol lightened.  Suspect an overwhelming neurological injury.  Will need to discuss with family regarding overall prognosis and goals for care May repeat his CT head on 1/4 to assess for evolving gl;obal injury   FAMILY  Girlfriend and mother have visited 1/1.  I was able to communicate with his mother on 1/3 via a telephone for the hearing impaired.  I explained that the patient likely has a profound brain injury, that the prognosis for meaningful neurological recovery is very poor.  She understood this.  She asked whether I felt we needed to consider removal of life support.  I indicated that this is probably the case in absence of any evidence for a potential neurological recovery.  She is going to work on getting to the hospital because she would like to be present when we make that decision and when we  transition to comfort.  We will continue his current level of support, we will not escalate.  I will not consult surgery regarding his decubitus wounds at this time.  We discussed changing CODE STATUS to DNR should he have an acute decompensation.  She agreed with this plan and I will place the orders.  Attempted to call his girlfriend Nettie Elm, but couldn't get through.    Independent CC time 34 minutes  Levy Pupa, MD, PhD 11/12/2017, 11:34 AM [facility] [phone] or if no answer [phone]

## 2017-11-21 NOTE — Progress Notes (Signed)
Pharmacy Antibiotic Note  Raymond Fox is a 57 y.o. male s/p OOH arrest with MRSA bacteremia on vancomycin and also with necrotic wounds on rocephin/flagyl.  Pharmacy has been consulted for vancomycin dosing. -WBC= 19.6, SCr= 1.58 (baseline ~ 0.8) and CrCL ~ 40  Plan: -Hold vancomycin for now -Will check a vancomycin level later today -Will follow renal function, cultures and clinical progress   Height: 5\' 8"  (172.7 cm) Weight: 117 lb 2.1 oz (53.1 kg) IBW/kg (Calculated) : 68.4  Temp (24hrs), Avg:97.8 F (36.6 C), Min:96.6 F (35.9 C), Max:98.8 F (37.1 C)  Recent Labs  Lab 07-09-2018 1000 07-09-2018 1155 07-09-2018 1509 07-09-2018 1524 07-09-2018 1740 10/22/17 0404 10/22/17 0411 11/15/2017 0420  WBC 21.6*  --  32.1*  --   --   --  26.5* 19.6*  CREATININE 1.10  --  1.16  --  1.35* 1.38*  --  1.58*  LATICACIDVEN  --  5.49* 2.3* 2.25* 3.0*  --   --   --     Estimated Creatinine Clearance: 38.7 mL/min (A) (by C-G formula based on SCr of 1.58 mg/dL (H)).    Allergies  Allergen Reactions  . Penicillins Rash    Tolerated zosyn for 3 days without problems 1/23 Has patient had a PCN reaction causing immediate rash, facial/tongue/throat swelling, SOB or lightheadedness with hypotension: Yes Has patient had a PCN reaction causing severe rash involving mucus membranes or skin necrosis: No Has patient had a PCN reaction that required hospitalization No Has patient had a PCN reaction occurring within the last 10 years: No If all of the above answers are "NO", then may proceed with Cephalosporin use. 1    Antimicrobials this admission: 1/2 vanc 1/2 rocephin 1/2 flagyl  Dose adjustments this admission:   Microbiology results: 1/1 urine 1/1 blood x2- GPC/clusters 1/2 BCID with MRSA 1/2 blood x2- ngtd  Thank you for allowing pharmacy to be a part of this patient's care.  Harland GermanAndrew Annalyn Blecher, Pharm D 10/22/2017 10:51 AM

## 2017-11-21 NOTE — Progress Notes (Signed)
160ml Morphine gtt wasted in sink w/ RN Laury AxonMelvin Amo

## 2017-11-21 NOTE — Progress Notes (Signed)
ANTICOAGULATION CONSULT NOTE - Follow Up Consult  Pharmacy Consult for heparin Indication: pulmonary embolus  Labs: Recent Labs    11/16/2017 1509 11/16/2017 1740 11/16/2017 1743  10/22/17 0156 10/22/17 0404 10/22/17 0411 10/22/17 0920 10/22/17 2125 11/12/2017 0420  HGB 12.3*  --   --   --   --   --  11.8*  --   --  10.8*  HCT 39.9  --   --   --   --   --  37.4*  --   --  34.3*  PLT 329  --   --   --   --   --  340  --   --  363  APTT  --  33  --   --  92*  --   --   --   --   --   LABPROT  --  16.1*  --   --  15.5*  --   --   --   --   --   INR  --  1.30  --   --  1.24  --   --   --   --   --   HEPARINUNFRC  --   --   --    < > 0.43  --   --  0.19* 0.41 0.22*  CREATININE 1.16 1.35*  --   --   --  1.38*  --   --   --   --   TROPONINI 0.24*  --  0.41*  --  0.44*  --   --   --   --   --    < > = values in this interval not displayed.    Assessment: 57yo male now subtherapeutic on heparin after one level at goal; RN reports heparin gtt had been off x5830min a few hours ago d/t loss of IV, no other problems; Hgb trending down, Plt stable.  Goal of Therapy:  Heparin level 0.3-0.7 units/ml   Plan:  Will rebolus with heparin 1500 units and increase gtt by 2 units/kg/hr to 1100 units/hr and check level in 8hr.  Vernard GamblesVeronda Averianna Brugger, PharmD, BCPS  10/30/2017,5:39 AM

## 2017-11-21 DEATH — deceased

## 2018-11-23 IMAGING — CR DG CHEST 1V PORT
2 series · 2 of 2 positions shown · non-contrast
Comparison: 12/21/2016

CLINICAL DATA: 56-year-old female with a history of intubation,
cardiac arrest

EXAM:
PORTABLE CHEST 1 VIEW

[AP (1 of 2)]
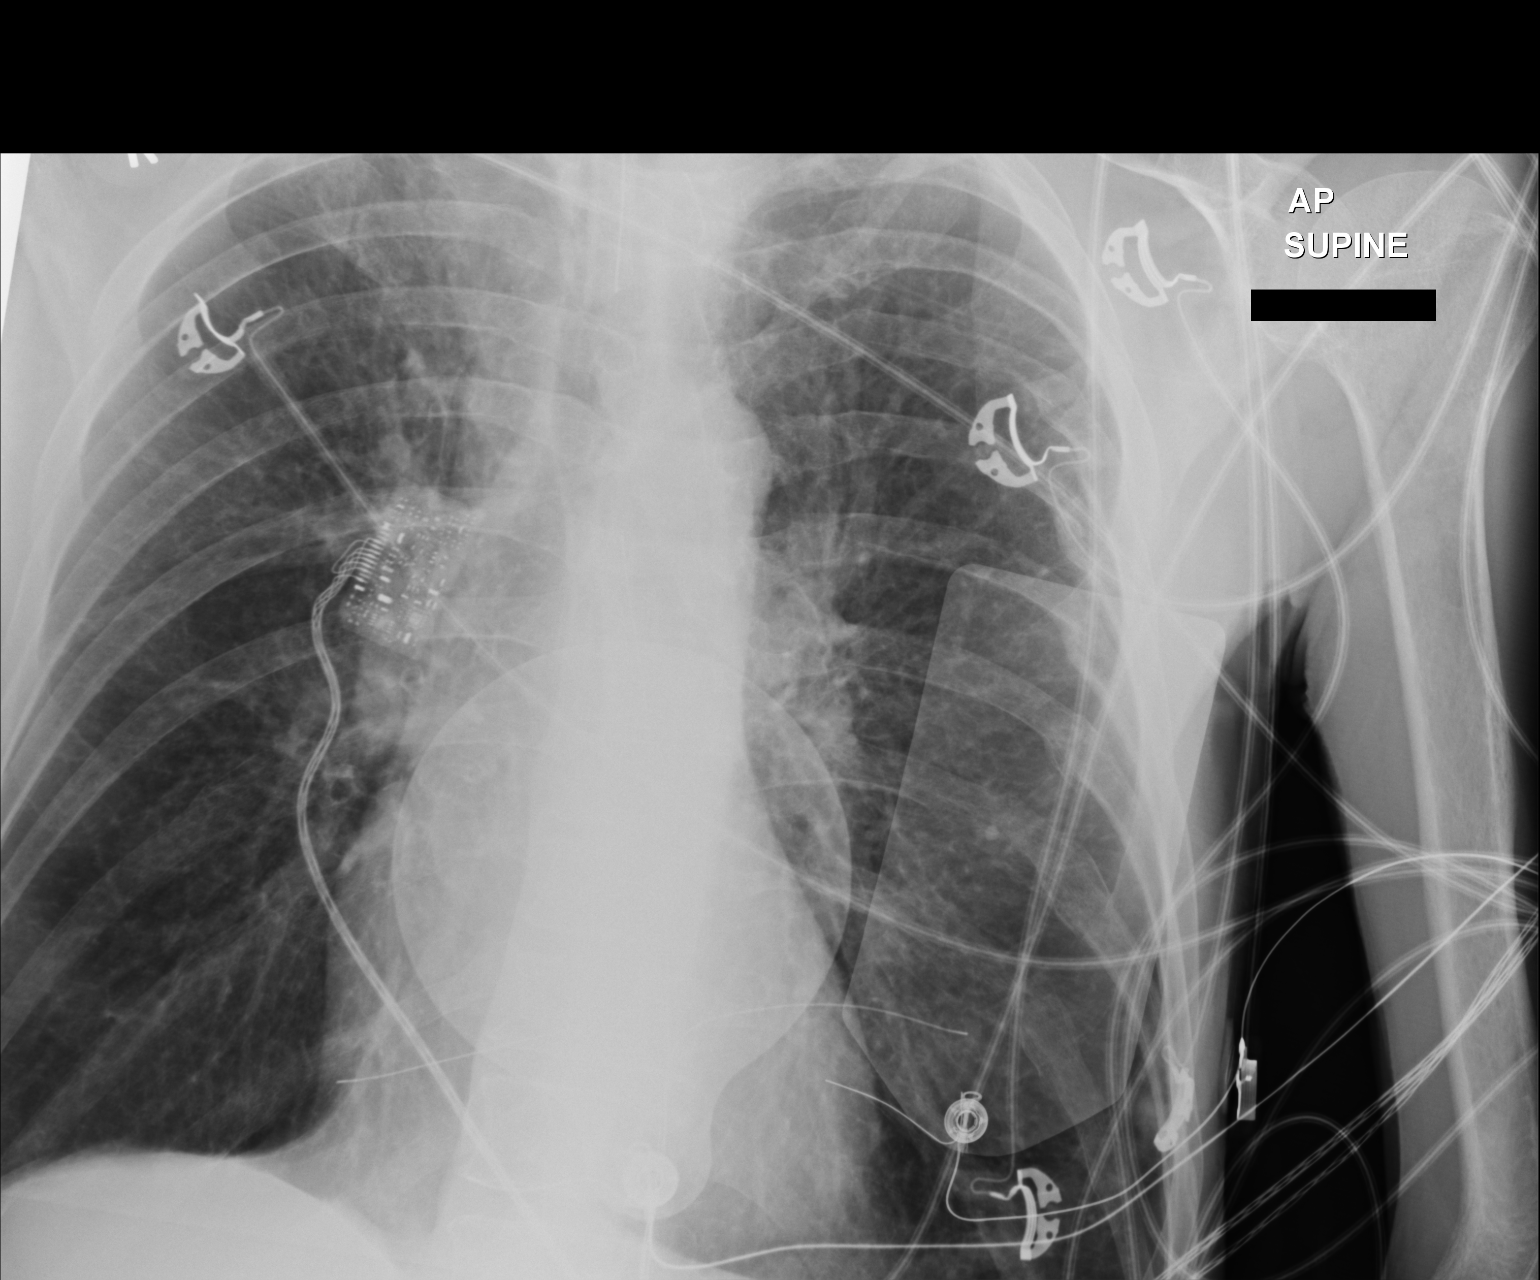

[AP (2 of 2)]
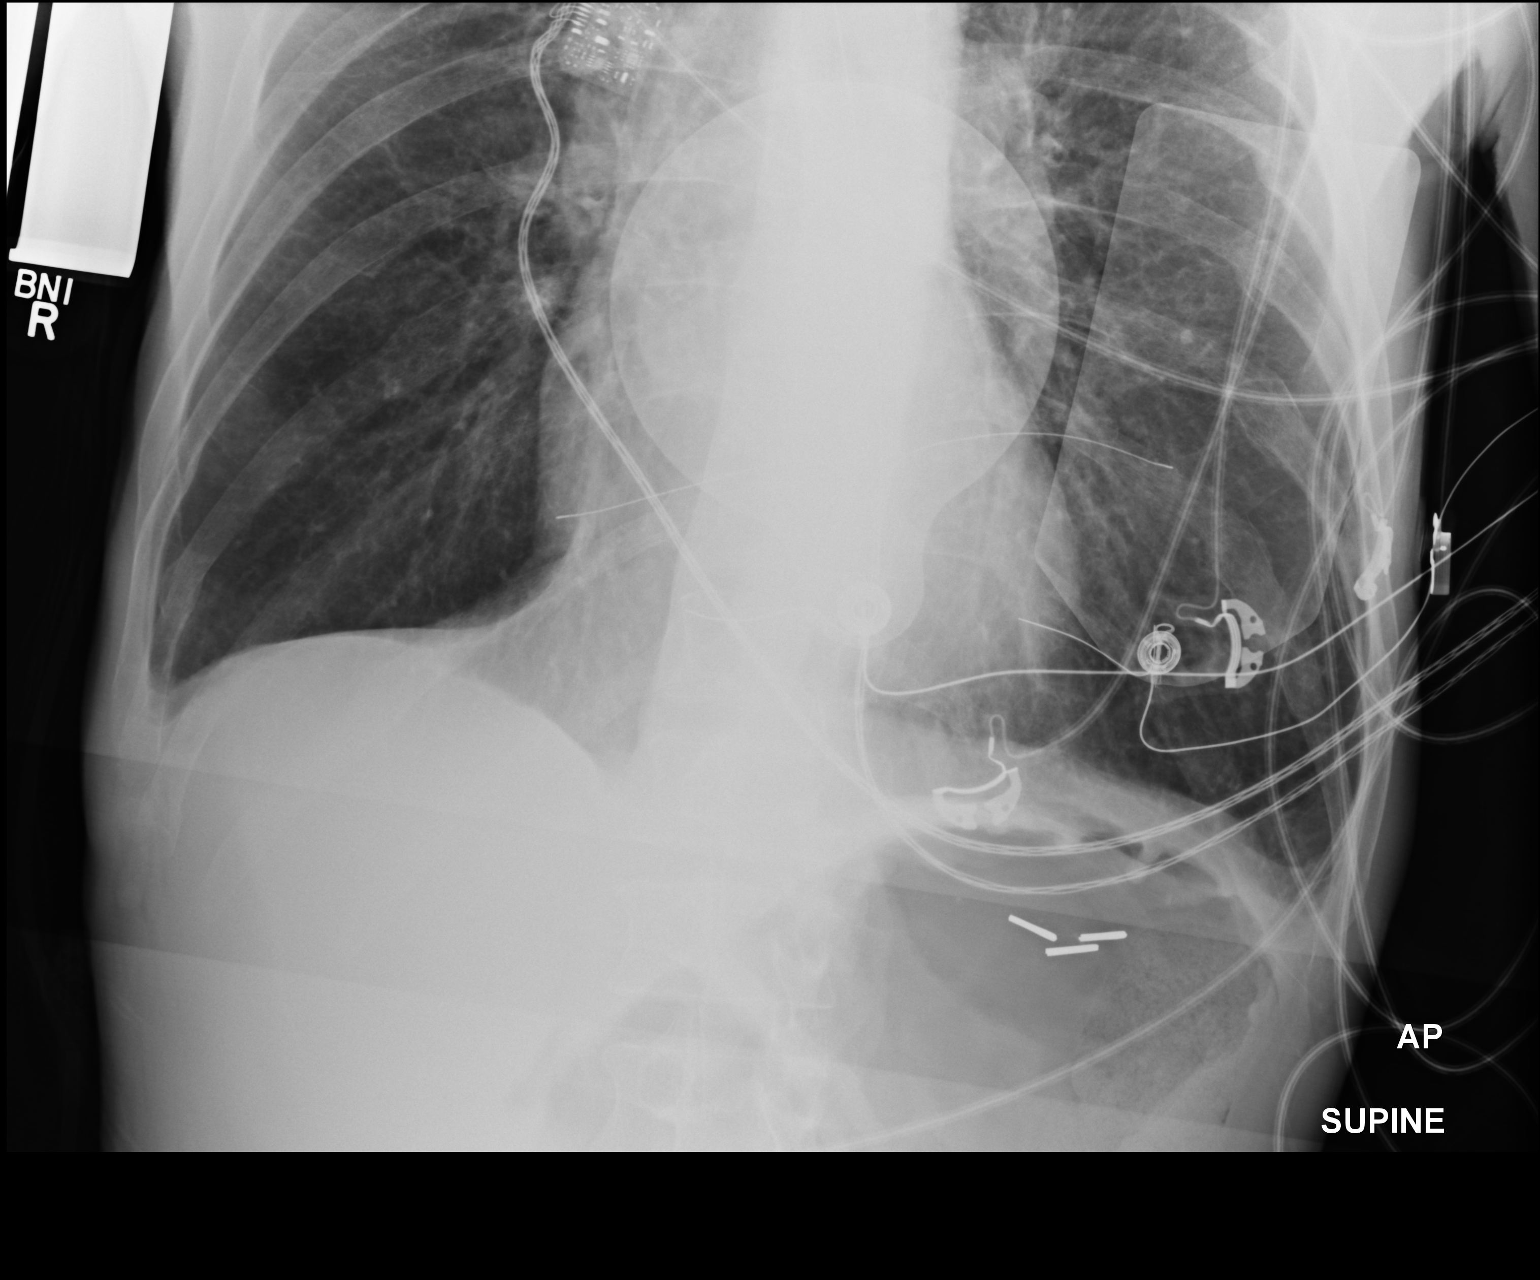

[2 of 2 positions shown; findings below may reference images not displayed]

FINDINGS: Cardiomediastinal silhouette unchanged.

Endotracheal tube terminates 6.5 cm above the carina.

Defibrillator pads project over the chest.

No large pleural effusion. No confluent airspace disease or
pneumothorax.

Partial healing of multiple bilateral rib fractures, incompletely
visualized.
IMPRESSION: Endotracheal tube terminates suitably above the carina.

No evidence of confluent airspace disease, pneumothorax, or large
pleural effusion.

Defibrillator pads on the chest wall.

Multiple partially healed/ remodeled bilateral rib fractures.

## 2018-11-23 IMAGING — CT CT HEAD W/O CM
4 series · 16 of 47 positions shown, 18 images · non-contrast
Comparison: 02/04/2016

CLINICAL DATA: Altered level of consciousness (LOC), unexplained

EXAM:
CT HEAD WITHOUT CONTRAST
TECHNIQUE: Contiguous axial images were obtained from the base of the skull
through the vertex without intravenous contrast.

[Series 3: head without · axial · non-contrast · 0.46mm/px · z∈[-71,+49]mm · 7 of 33 slices shown, 9 images]
[im 5/33  brain]
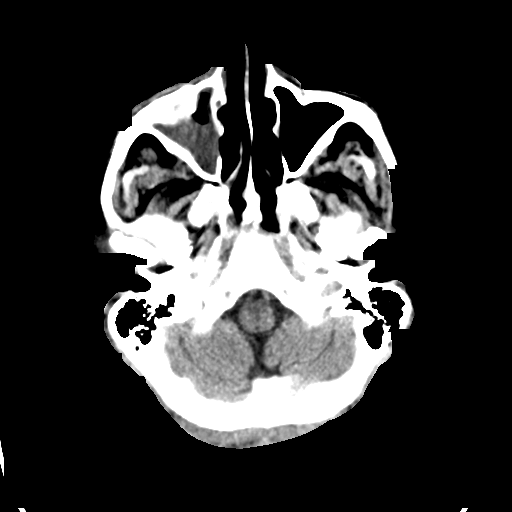
[im 5/33  bone]
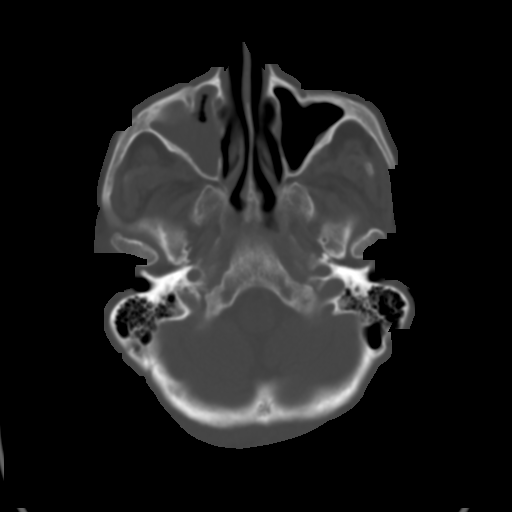
[im 9/33  brain]
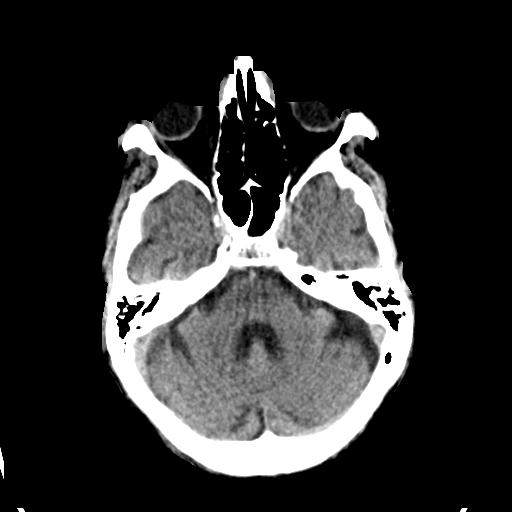
[im 13/33  brain]
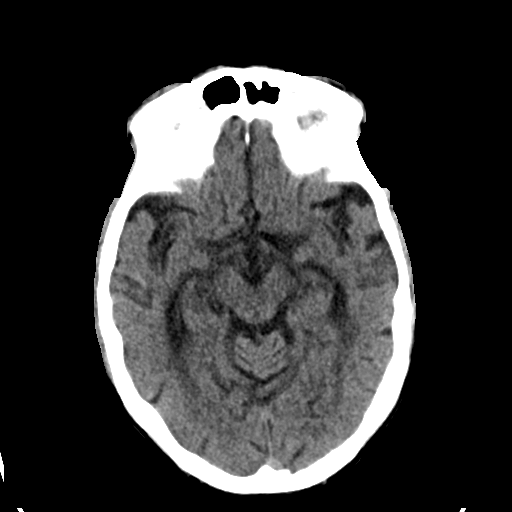
[im 17/33  brain]
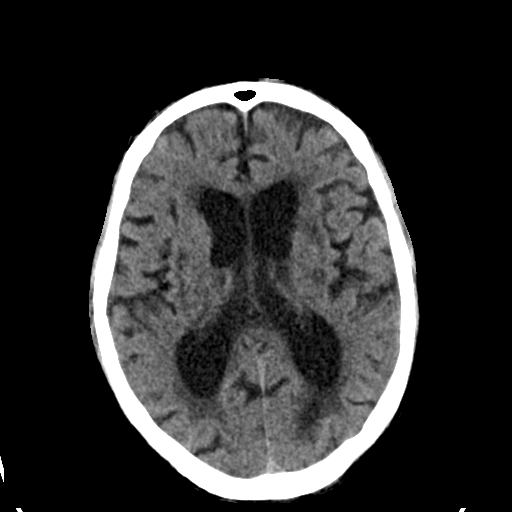
[im 21/33  brain]
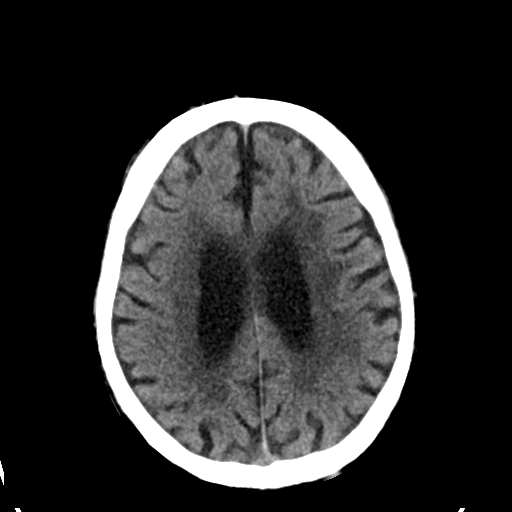
[im 21/33  bone]
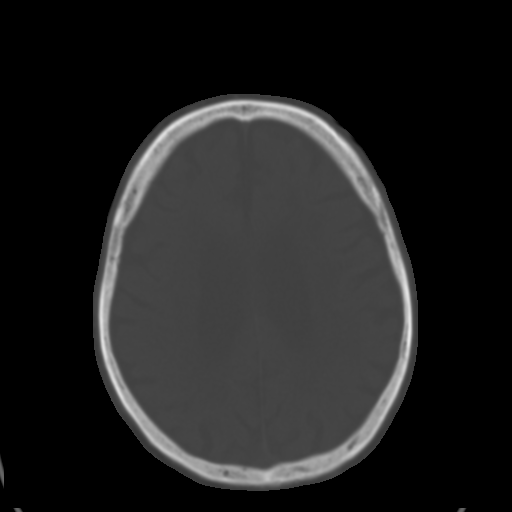
[im 25/33  brain]
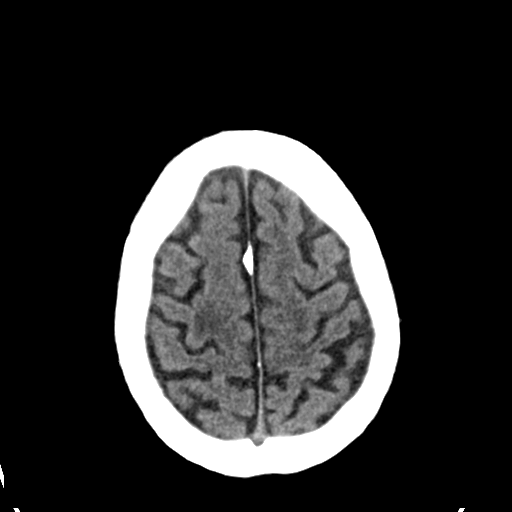
[im 29/33  brain]
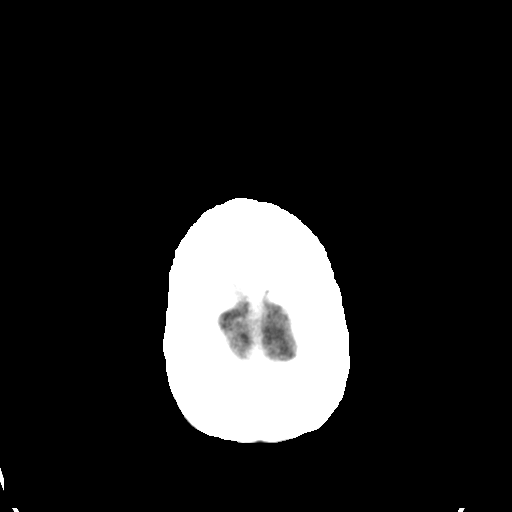

[Series 4: head bone · axial · 0.46mm/px · z∈[-75,-43]mm · 3 of 82 slices shown]
[im 9/82  bone]
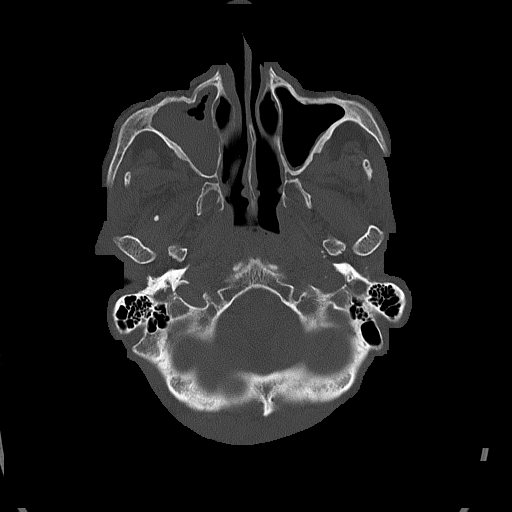
[im 17/82  bone]
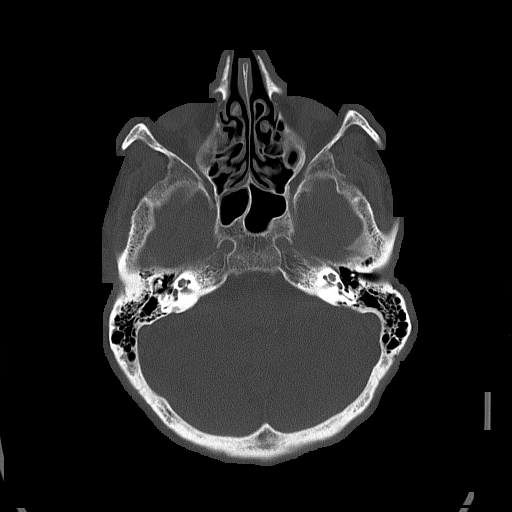
[im 25/82  bone]
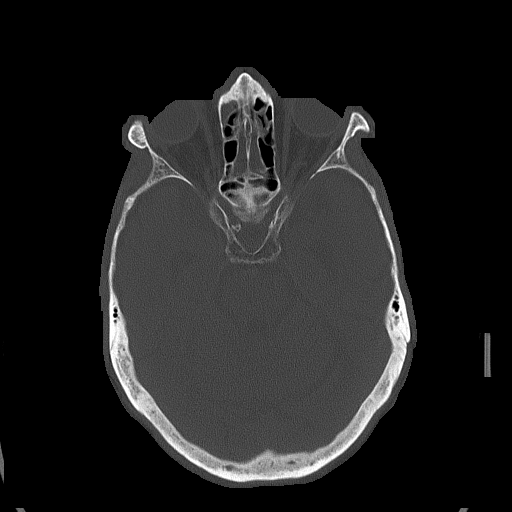

[Series 5: head without cor · coronal · non-contrast · 0.33mm/px · 3 of 67 slices shown]
[im 23/67  brain]
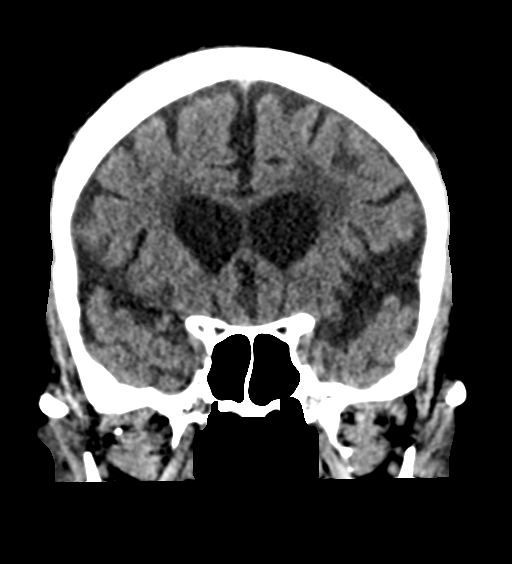
[im 30/67  brain]
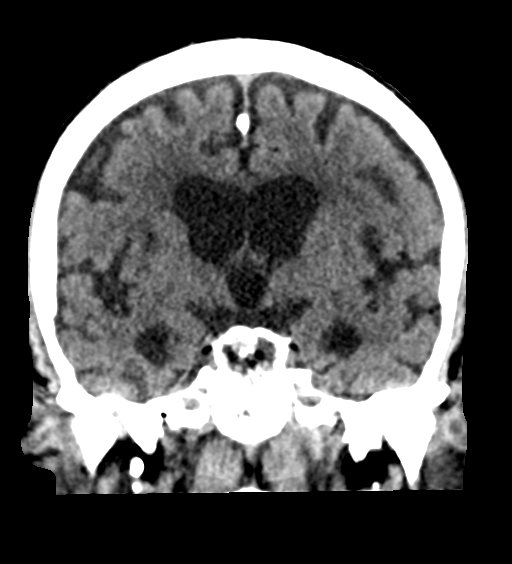
[im 37/67  brain]
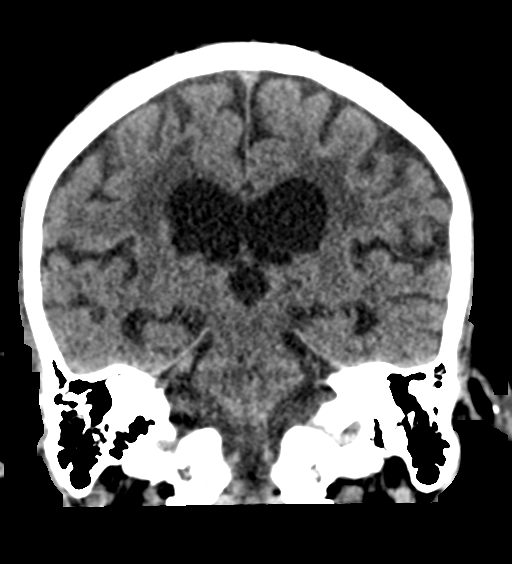

[Series 6: head without sag · sagittal · non-contrast · 0.32mm/px · 3 of 61 slices shown]
[im 21/61  brain]
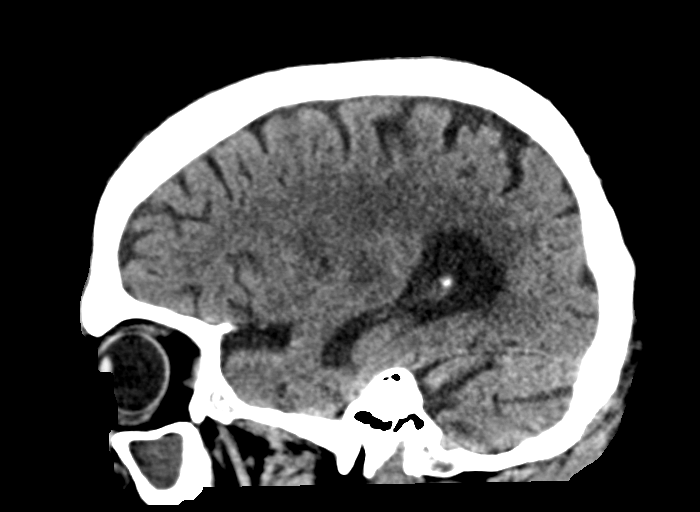
[im 31/61  brain]
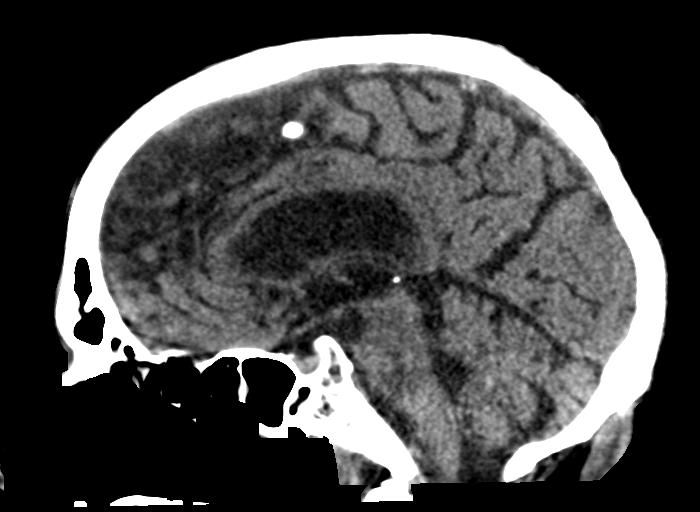
[im 41/61  brain]
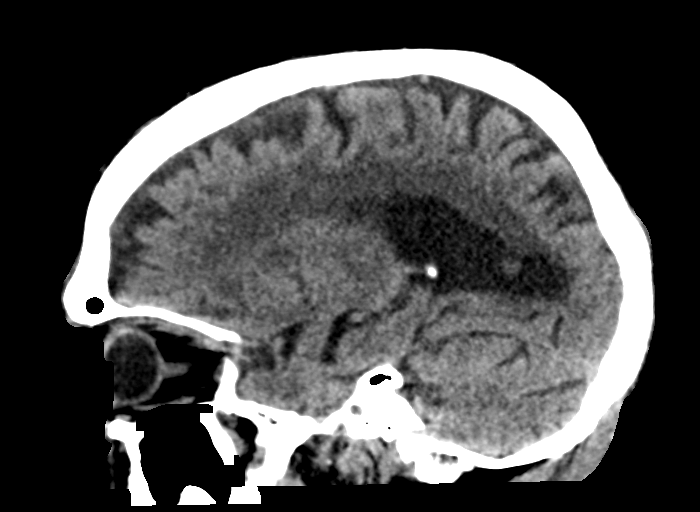

[16 of 47 positions shown; findings below may reference images not displayed]

FINDINGS: Brain: No evidence of acute infarction, hemorrhage, hydrocephalus,
extra-axial collection, or mass lesion/mass effect. Stable mild
cerebral atrophy and moderate chronic small vessel disease.

Vascular:  No hyperdense vessel or other acute findings.

Skull: No evidence of fracture or other significant bone
abnormality.

Sinuses/Orbits: Near complete opacification of right maxillary
sinus, new since prior study.

Other: None.
IMPRESSION: No acute intracranial abnormality. Stable cerebral atrophy and
chronic small vessel disease.

Near complete opacification of right maxillary sinus.
